# Patient Record
Sex: Female | Born: 1950 | Race: White | Hispanic: No | Marital: Married | Smoking: Former smoker
Health system: Southern US, Community
[De-identification: ages and names within clinical notes are randomized; demographics above are authoritative.]

## PROBLEM LIST (undated history)

## (undated) DIAGNOSIS — I10 Essential (primary) hypertension: Secondary | ICD-10-CM

## (undated) DIAGNOSIS — R32 Unspecified urinary incontinence: Secondary | ICD-10-CM

## (undated) DIAGNOSIS — E039 Hypothyroidism, unspecified: Secondary | ICD-10-CM

## (undated) HISTORY — PX: OTHER SURGICAL HISTORY: SHX169

## (undated) HISTORY — DX: Essential (primary) hypertension: I10

## (undated) HISTORY — DX: Unspecified urinary incontinence: R32

## (undated) HISTORY — DX: Hypothyroidism, unspecified: E03.9

---

## 1997-07-13 ENCOUNTER — Other Ambulatory Visit: Admission: RE | Admit: 1997-07-13 | Discharge: 1997-07-13 | Payer: Self-pay | Admitting: *Deleted

## 1997-11-09 ENCOUNTER — Other Ambulatory Visit: Admission: RE | Admit: 1997-11-09 | Discharge: 1997-11-09 | Payer: Self-pay | Admitting: *Deleted

## 1998-01-05 ENCOUNTER — Other Ambulatory Visit: Admission: RE | Admit: 1998-01-05 | Discharge: 1998-01-05 | Payer: Self-pay | Admitting: *Deleted

## 1998-06-07 ENCOUNTER — Other Ambulatory Visit: Admission: RE | Admit: 1998-06-07 | Discharge: 1998-06-07 | Payer: Self-pay | Admitting: *Deleted

## 1999-06-28 ENCOUNTER — Other Ambulatory Visit: Admission: RE | Admit: 1999-06-28 | Discharge: 1999-06-28 | Payer: Self-pay | Admitting: *Deleted

## 1999-09-16 ENCOUNTER — Other Ambulatory Visit: Admission: RE | Admit: 1999-09-16 | Discharge: 1999-09-16 | Payer: Self-pay | Admitting: *Deleted

## 2000-07-16 ENCOUNTER — Other Ambulatory Visit: Admission: RE | Admit: 2000-07-16 | Discharge: 2000-07-16 | Payer: Self-pay | Admitting: *Deleted

## 2000-11-19 ENCOUNTER — Other Ambulatory Visit: Admission: RE | Admit: 2000-11-19 | Discharge: 2000-11-19 | Payer: Self-pay | Admitting: *Deleted

## 2001-06-24 ENCOUNTER — Other Ambulatory Visit: Admission: RE | Admit: 2001-06-24 | Discharge: 2001-06-24 | Payer: Self-pay | Admitting: *Deleted

## 2002-07-01 ENCOUNTER — Other Ambulatory Visit: Admission: RE | Admit: 2002-07-01 | Discharge: 2002-07-01 | Payer: Self-pay | Admitting: *Deleted

## 2003-07-07 ENCOUNTER — Other Ambulatory Visit: Payer: Self-pay

## 2003-09-07 ENCOUNTER — Other Ambulatory Visit: Admission: RE | Admit: 2003-09-07 | Discharge: 2003-09-07 | Payer: Self-pay | Admitting: *Deleted

## 2004-09-26 ENCOUNTER — Other Ambulatory Visit: Admission: RE | Admit: 2004-09-26 | Discharge: 2004-09-26 | Payer: Self-pay | Admitting: *Deleted

## 2005-01-08 ENCOUNTER — Emergency Department (HOSPITAL_COMMUNITY): Admission: EM | Admit: 2005-01-08 | Discharge: 2005-01-08 | Payer: Self-pay | Admitting: Emergency Medicine

## 2005-01-12 ENCOUNTER — Emergency Department: Payer: Self-pay | Admitting: Internal Medicine

## 2005-09-15 ENCOUNTER — Ambulatory Visit: Payer: Self-pay | Admitting: Internal Medicine

## 2006-06-19 ENCOUNTER — Ambulatory Visit: Payer: Self-pay | Admitting: Gastroenterology

## 2006-12-18 ENCOUNTER — Ambulatory Visit: Payer: Self-pay | Admitting: Internal Medicine

## 2008-10-01 ENCOUNTER — Ambulatory Visit: Payer: Self-pay | Admitting: Internal Medicine

## 2009-03-16 ENCOUNTER — Observation Stay: Payer: Self-pay | Admitting: Internal Medicine

## 2010-12-12 ENCOUNTER — Ambulatory Visit: Payer: Self-pay | Admitting: Internal Medicine

## 2011-04-11 ENCOUNTER — Ambulatory Visit: Payer: Self-pay | Admitting: Urology

## 2011-04-24 ENCOUNTER — Ambulatory Visit: Payer: Self-pay | Admitting: Urology

## 2011-05-19 ENCOUNTER — Ambulatory Visit: Payer: Self-pay | Admitting: Urology

## 2011-05-31 ENCOUNTER — Ambulatory Visit: Payer: Self-pay | Admitting: Urology

## 2011-06-08 ENCOUNTER — Ambulatory Visit: Payer: Self-pay | Admitting: Urology

## 2011-06-15 ENCOUNTER — Ambulatory Visit: Payer: Self-pay | Admitting: Urology

## 2011-12-13 ENCOUNTER — Ambulatory Visit: Payer: Self-pay

## 2012-12-26 ENCOUNTER — Ambulatory Visit: Payer: Self-pay

## 2013-02-03 ENCOUNTER — Ambulatory Visit: Payer: Self-pay

## 2013-05-26 ENCOUNTER — Ambulatory Visit: Payer: Self-pay | Admitting: Obstetrics & Gynecology

## 2013-05-26 DIAGNOSIS — Z01810 Encounter for preprocedural cardiovascular examination: Secondary | ICD-10-CM

## 2013-05-26 LAB — CBC
HCT: 43.5 % (ref 35.0–47.0)
HGB: 15 g/dL (ref 12.0–16.0)
MCH: 29 pg (ref 26.0–34.0)
MCHC: 34.4 g/dL (ref 32.0–36.0)
MCV: 84 fL (ref 80–100)
Platelet: 193 10*3/uL (ref 150–440)
RBC: 5.17 10*6/uL (ref 3.80–5.20)
RDW: 14.2 % (ref 11.5–14.5)
WBC: 6.7 10*3/uL (ref 3.6–11.0)

## 2013-05-26 LAB — BASIC METABOLIC PANEL
Anion Gap: 3 — ABNORMAL LOW (ref 7–16)
BUN: 11 mg/dL (ref 7–18)
CALCIUM: 9.2 mg/dL (ref 8.5–10.1)
CO2: 31 mmol/L (ref 21–32)
Chloride: 104 mmol/L (ref 98–107)
Creatinine: 0.66 mg/dL (ref 0.60–1.30)
Glucose: 81 mg/dL (ref 65–99)
OSMOLALITY: 274 (ref 275–301)
Potassium: 3.5 mmol/L (ref 3.5–5.1)
Sodium: 138 mmol/L (ref 136–145)

## 2013-05-26 LAB — PROTIME-INR
INR: 1
Prothrombin Time: 12.8 secs (ref 11.5–14.7)

## 2013-05-26 LAB — APTT: Activated PTT: 30 secs (ref 23.6–35.9)

## 2013-05-27 ENCOUNTER — Ambulatory Visit: Payer: Self-pay | Admitting: Obstetrics & Gynecology

## 2013-05-28 LAB — HEMOGLOBIN: HGB: 14.3 g/dL (ref 12.0–16.0)

## 2013-05-28 LAB — PATHOLOGY REPORT

## 2014-03-16 ENCOUNTER — Ambulatory Visit: Payer: Self-pay

## 2014-09-12 NOTE — Op Note (Signed)
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   PROCEDURE: Total vaginal hysterectomy with anterior colporrhaphy.   SURGEON: Annamarie MajorPaul Roniya Tetro, M.D.   ASSISTANT: Jean RosenthalJackson.   ANESTHESIA: Spinal.   ESTIMATED BLOOD LOSS: 50 mL.   COMPLICATIONS: None.   FINDINGS: Small uterus with [name] significant uterine prolapse along with cystocele, ovaries were atrophic and high and not assessable.   DISPOSITION: To the recovery room in stable condition.   TECHNIQUE: The patient is prepped and draped in the usual sterile fashion after adequate anesthesia is obtained in the dorsal lithotomy position. [name] Foley catheter is inserted. [name] speculum is placed and the cervix is identified. Bimanual exam is performed. The uterus is small with no adnexal masses. The prolapse is significant for both cystocele and uterine prolapse.   [name] Jacobs tenaculum is placed on the cervix. The circumference of the cervix is infiltrated with 1% lidocaine with epinephrine followed by [name] circumferential incision using the Bovie electrocautery. Dissection into the posterior cul-de-sac is performed and [name] weighted speculum is applied. The uterosacral ligaments are clamped, transected, and suture ligated and sutured to the vaginal cuff. The uterine arteries are clamped, transected, and suture ligated. Anterior peritoneum is penetrated and retractors placed. The remaining portion of the broad ligament and cardinal ligaments are clamped, transected, and suture ligated. The cornua was clamped, transected, and suture ligated with complete amputation of the uterus and cervix.   Inspection for bleeding is performed with excellent hemostasis noted. Ovaries are now visually assessable or palpated and are left in place. The  peritoneum is closed with 1 Vicryl suture in [name] pursestring fashion, and the uterosacral ligaments are plicated with Ethibond suture.   Anterior colporrhaphy is performed with Allis clamps placed along the midline anterior vaginal wall and 1% lidocaine with epinephrine is infiltrated in this submucosal space. The endopelvic fascia is then dissected away from the vaginal mucosa after the midline incision is performed.  Using Vicryl sutures, plication sutures are applied to incorporate the endopelvic fascia across the midline. Excess vaginal mucosa is then excised. The incision of the anterior colporrhaphy and hysterectomy is then closed with [name] running 2-0 Vicryl suture in [name] locking fashion with excellent hemostasis noted. The vaginal cavity is irrigated and [name] packing sponge with Premarin cream applied, is placed vaginally for overnight purposes. Catheter is left in place. The patient goes to the recovery room in stable condition. All sponge, instrument and needle counts are correct.    ____________________________ R. Annamarie MajorPaul Obdulio Mash, MD rph:sg D: 05/27/2013 13:06:46 ET T: 05/27/2013 13:24:26 ET JOB#: [id number]  cc: Dierdre Searles. Paul Tsering Leaman, MD, <Dictator> Nadara MustardOBERT P Tarini Carrier MD ELECTRONICALLY SIGNED 05/28/2013 7:17

## 2015-01-06 ENCOUNTER — Other Ambulatory Visit: Payer: Self-pay | Admitting: Nurse Practitioner

## 2015-01-06 DIAGNOSIS — Z1231 Encounter for screening mammogram for malignant neoplasm of breast: Secondary | ICD-10-CM

## 2015-03-29 ENCOUNTER — Ambulatory Visit
Admission: RE | Admit: 2015-03-29 | Discharge: 2015-03-29 | Disposition: A | Discharge status: Home / Self Care | Payer: PPO | Source: Ambulatory Visit | Attending: Nurse Practitioner | Admitting: Nurse Practitioner

## 2015-03-29 DIAGNOSIS — Z1231 Encounter for screening mammogram for malignant neoplasm of breast: Secondary | ICD-10-CM | POA: Insufficient documentation

## 2015-07-13 DIAGNOSIS — N951 Menopausal and female climacteric states: Secondary | ICD-10-CM | POA: Diagnosis not present

## 2015-07-13 DIAGNOSIS — F411 Generalized anxiety disorder: Secondary | ICD-10-CM | POA: Diagnosis not present

## 2015-07-13 DIAGNOSIS — J309 Allergic rhinitis, unspecified: Secondary | ICD-10-CM | POA: Diagnosis not present

## 2015-07-13 DIAGNOSIS — E039 Hypothyroidism, unspecified: Secondary | ICD-10-CM | POA: Diagnosis not present

## 2015-07-13 DIAGNOSIS — E559 Vitamin D deficiency, unspecified: Secondary | ICD-10-CM | POA: Diagnosis not present

## 2015-07-13 DIAGNOSIS — Z0001 Encounter for general adult medical examination with abnormal findings: Secondary | ICD-10-CM | POA: Diagnosis not present

## 2016-01-10 DIAGNOSIS — E039 Hypothyroidism, unspecified: Secondary | ICD-10-CM | POA: Diagnosis not present

## 2016-01-10 DIAGNOSIS — E559 Vitamin D deficiency, unspecified: Secondary | ICD-10-CM | POA: Diagnosis not present

## 2016-01-10 DIAGNOSIS — G4700 Insomnia, unspecified: Secondary | ICD-10-CM | POA: Diagnosis not present

## 2016-01-10 DIAGNOSIS — I10 Essential (primary) hypertension: Secondary | ICD-10-CM | POA: Diagnosis not present

## 2016-01-10 DIAGNOSIS — N951 Menopausal and female climacteric states: Secondary | ICD-10-CM | POA: Diagnosis not present

## 2016-03-01 DIAGNOSIS — M5412 Radiculopathy, cervical region: Secondary | ICD-10-CM | POA: Diagnosis not present

## 2016-03-22 DIAGNOSIS — M5412 Radiculopathy, cervical region: Secondary | ICD-10-CM | POA: Diagnosis not present

## 2016-07-18 ENCOUNTER — Other Ambulatory Visit: Payer: Self-pay | Admitting: Internal Medicine

## 2016-07-18 DIAGNOSIS — N390 Urinary tract infection, site not specified: Secondary | ICD-10-CM | POA: Diagnosis not present

## 2016-07-18 DIAGNOSIS — N951 Menopausal and female climacteric states: Secondary | ICD-10-CM | POA: Diagnosis not present

## 2016-07-18 DIAGNOSIS — H6123 Impacted cerumen, bilateral: Secondary | ICD-10-CM | POA: Diagnosis not present

## 2016-07-18 DIAGNOSIS — Z0001 Encounter for general adult medical examination with abnormal findings: Secondary | ICD-10-CM | POA: Diagnosis not present

## 2016-07-18 DIAGNOSIS — R030 Elevated blood-pressure reading, without diagnosis of hypertension: Secondary | ICD-10-CM | POA: Diagnosis not present

## 2016-07-18 DIAGNOSIS — Z1231 Encounter for screening mammogram for malignant neoplasm of breast: Secondary | ICD-10-CM

## 2016-07-18 DIAGNOSIS — E039 Hypothyroidism, unspecified: Secondary | ICD-10-CM | POA: Diagnosis not present

## 2016-07-24 DIAGNOSIS — H6121 Impacted cerumen, right ear: Secondary | ICD-10-CM | POA: Diagnosis not present

## 2016-07-24 DIAGNOSIS — G4700 Insomnia, unspecified: Secondary | ICD-10-CM | POA: Diagnosis not present

## 2016-07-24 DIAGNOSIS — R030 Elevated blood-pressure reading, without diagnosis of hypertension: Secondary | ICD-10-CM | POA: Diagnosis not present

## 2016-08-15 ENCOUNTER — Ambulatory Visit
Admission: RE | Admit: 2016-08-15 | Discharge: 2016-08-15 | Disposition: A | Discharge status: Home / Self Care | Payer: PPO | Source: Ambulatory Visit | Attending: Internal Medicine | Admitting: Internal Medicine

## 2016-08-15 DIAGNOSIS — Z1231 Encounter for screening mammogram for malignant neoplasm of breast: Secondary | ICD-10-CM | POA: Diagnosis not present

## 2016-11-14 ENCOUNTER — Emergency Department
Admission: EM | Admit: 2016-11-14 | Discharge: 2016-11-14 | Disposition: A | Discharge status: Home / Self Care | Payer: PPO | Attending: Emergency Medicine | Admitting: Emergency Medicine

## 2016-11-14 ENCOUNTER — Emergency Department: Payer: PPO

## 2016-11-14 ENCOUNTER — Encounter: Payer: Self-pay | Admitting: *Deleted

## 2016-11-14 DIAGNOSIS — R42 Dizziness and giddiness: Secondary | ICD-10-CM | POA: Diagnosis not present

## 2016-11-14 DIAGNOSIS — R030 Elevated blood-pressure reading, without diagnosis of hypertension: Secondary | ICD-10-CM | POA: Diagnosis not present

## 2016-11-14 DIAGNOSIS — R110 Nausea: Secondary | ICD-10-CM | POA: Diagnosis not present

## 2016-11-14 DIAGNOSIS — I10 Essential (primary) hypertension: Secondary | ICD-10-CM | POA: Diagnosis not present

## 2016-11-14 DIAGNOSIS — Z79899 Other long term (current) drug therapy: Secondary | ICD-10-CM | POA: Insufficient documentation

## 2016-11-14 DIAGNOSIS — G4453 Primary thunderclap headache: Secondary | ICD-10-CM | POA: Insufficient documentation

## 2016-11-14 DIAGNOSIS — R6884 Jaw pain: Secondary | ICD-10-CM | POA: Diagnosis not present

## 2016-11-14 DIAGNOSIS — R51 Headache: Secondary | ICD-10-CM | POA: Diagnosis not present

## 2016-11-14 LAB — BASIC METABOLIC PANEL
Anion gap: 6 (ref 5–15)
BUN: 14 mg/dL (ref 6–20)
CO2: 27 mmol/L (ref 22–32)
Calcium: 9.4 mg/dL (ref 8.9–10.3)
Chloride: 105 mmol/L (ref 101–111)
Creatinine, Ser: 0.73 mg/dL (ref 0.44–1.00)
GFR calc Af Amer: >60 mL/min (ref >60)
GFR calc non Af Amer: >60 mL/min (ref >60)
Glucose, Bld: 126 mg/dL — ABNORMAL HIGH (ref 65–99)
Potassium: 3.9 mmol/L (ref 3.5–5.1)
Sodium: 138 mmol/L (ref 135–145)

## 2016-11-14 LAB — PROTIME-INR
INR: 0.99
Prothrombin Time: 13.1 seconds (ref 11.4–15.2)

## 2016-11-14 LAB — CBC
HCT: 45.9 % (ref 35.0–47.0)
Hemoglobin: 15.5 g/dL (ref 12.0–16.0)
MCH: 28.6 pg (ref 26.0–34.0)
MCHC: 33.7 g/dL (ref 32.0–36.0)
MCV: 84.8 fL (ref 80.0–100.0)
Platelets: 206 10*3/uL (ref 150–440)
RBC: 5.41 MIL/uL — ABNORMAL HIGH (ref 3.80–5.20)
RDW: 15.6 % — ABNORMAL HIGH (ref 11.5–14.5)
WBC: 7.9 10*3/uL (ref 3.6–11.0)

## 2016-11-14 LAB — HEPATIC FUNCTION PANEL
ALK PHOS: 113 U/L (ref 38–126)
ALT: 20 U/L (ref 14–54)
AST: 22 U/L (ref 15–41)
Albumin: 4.3 g/dL (ref 3.5–5.0)
BILIRUBIN TOTAL: 0.5 mg/dL (ref 0.3–1.2)
Bilirubin, Direct: <0.1 mg/dL — ABNORMAL LOW (ref 0.1–0.5)
Total Protein: 7.9 g/dL (ref 6.5–8.1)

## 2016-11-14 LAB — SEDIMENTATION RATE: Sed Rate: 4 mm/hr (ref 0–30)

## 2016-11-14 LAB — TROPONIN I: Troponin I: <0.03 ng/mL (ref <0.03)

## 2016-11-14 MED ORDER — IOPAMIDOL (ISOVUE-370) INJECTION 76%
75.0000 mL | Freq: Once | INTRAVENOUS | Status: AC | PRN
  Administered 2016-11-14: 75 mL via INTRAVENOUS

## 2016-11-14 MED ORDER — PROCHLORPERAZINE EDISYLATE 5 MG/ML IJ SOLN
10.0000 mg | Freq: Once | INTRAMUSCULAR | Status: AC
  Administered 2016-11-14: 10 mg via INTRAVENOUS
  Filled 2016-11-14: qty 2

## 2016-11-14 MED ORDER — DIPHENHYDRAMINE HCL 50 MG/ML IJ SOLN
25.0000 mg | Freq: Once | INTRAMUSCULAR | Status: AC
  Administered 2016-11-14: 25 mg via INTRAVENOUS
  Filled 2016-11-14: qty 1

## 2016-11-14 NOTE — ED Notes (Signed)
Pt states pain is subsided at this time.

## 2016-11-14 NOTE — ED Notes (Signed)
Patient transported to CT 

## 2016-11-14 NOTE — Discharge Instructions (Signed)
Fortunately today her blood work and your CT scans were reassuring. Your blood pressure however remains elevated which is concerning. Please make an appointment to follow-up with your primary care physician within this week for reevaluation. Return to the emergency department for any concerns.  It was a pleasure to take care of you today, and thank you for coming to our emergency department.  If you have any questions or concerns before leaving please ask the nurse to grab me and I'm more than happy to go through your aftercare instructions again.  If you were prescribed any opioid pain medication today such as Norco, Vicodin, Percocet, morphine, hydrocodone, or oxycodone please make sure you do not drive when you are taking this medication as it can alter your ability to drive safely.  If you have any concerns once you are home that you are not improving or are in fact getting worse before you can make it to your follow-up appointment, please do not hesitate to call [phone] and come back for further evaluation.  Merrily BrittleNeil Iseah Plouff MD  Results for orders placed or performed during the hospital encounter of 11/14/16  Basic metabolic panel  Result Value Ref Range   Sodium 138 135 - 145 mmol/L   Potassium 3.9 3.5 - 5.1 mmol/L   Chloride 105 101 - 111 mmol/L   CO2 27 22 - 32 mmol/L   Glucose, Bld 126 (H) 65 - 99 mg/dL   BUN 14 6 - 20 mg/dL   Creatinine, Ser 1.610.73 0.44 - 1.00 mg/dL   Calcium 9.4 8.9 - 09.610.3 mg/dL   GFR calc non Af Amer >60 >60 mL/min   GFR calc Af Amer >60 >60 mL/min   Anion gap 6 5 - 15  CBC  Result Value Ref Range   WBC 7.9 3.6 - 11.0 K/uL   RBC 5.41 (H) 3.80 - 5.20 MIL/uL   Hemoglobin 15.5 12.0 - 16.0 g/dL   HCT 04.545.9 40.935.0 - 81.147.0 %   MCV 84.8 80.0 - 100.0 fL   MCH 28.6 26.0 - 34.0 pg   MCHC 33.7 32.0 - 36.0 g/dL   RDW 91.415.6 (H) 78.211.5 - 95.614.5 %   Platelets 206 150 - 440 K/uL  Troponin I  Result Value Ref Range   Troponin I <0.03 <0.03 ng/mL  Sedimentation rate  Result Value Ref  Range   Sed Rate 4 0 - 30 mm/hr  Hepatic function panel  Result Value Ref Range   Total Protein 7.9 6.5 - 8.1 g/dL   Albumin 4.3 3.5 - 5.0 g/dL   AST 22 15 - 41 U/L   ALT 20 14 - 54 U/L   Alkaline Phosphatase 113 38 - 126 U/L   Total Bilirubin 0.5 0.3 - 1.2 mg/dL   Bilirubin, Direct <2.1<0.1 (L) 0.1 - 0.5 mg/dL   Indirect Bilirubin NOT CALCULATED 0.3 - 0.9 mg/dL  Protime-INR  Result Value Ref Range   Prothrombin Time 13.1 11.4 - 15.2 seconds   INR 0.99    Ct Angio Head W/cm &/or Wo Cm  Result Date: 11/14/2016 CLINICAL DATA:  Initial evaluation for acute thunderclap headache. EXAM: CT ANGIOGRAPHY HEAD AND NECK TECHNIQUE: Multidetector CT imaging of the head and neck was performed using the standard protocol during bolus administration of intravenous contrast. Multiplanar CT image reconstructions and MIPs were obtained to evaluate the vascular anatomy. Carotid stenosis measurements (when applicable) are obtained utilizing NASCET criteria, using the distal internal carotid diameter as the denominator. CONTRAST:  75 cc of Isovue 370.  COMPARISON:  None. FINDINGS: CT HEAD FINDINGS Brain: Age-related cerebral volume loss with mild chronic microvascular disease. No acute intracranial hemorrhage. No evidence for acute large vessel territory infarct. No mass lesion, midline shift or mass effect. No hydrocephalus. No extra-axial fluid collection. Calcification overlying the left frontal convexity favored to reflect an exostosis. Vascular: No hyperdense vessel. Scattered vascular calcifications noted within the carotid siphons. Skull: Scalp soft tissues and calvarium within normal limits. Sinuses: Visualized paranasal sinuses and mastoid air cells are clear. Orbits: Visualized globes and orbital soft tissues within normal limits. Review of the MIP images confirms the above findings CTA NECK FINDINGS Aortic arch: Visualized aortic arch of normal caliber with normal branch pattern. No flow-limiting stenosis about  the origin of the great vessels. Visualized subclavian artery is widely patent. Right carotid system: Right common and internal carotid artery's widely patent without stenosis, dissection, or occlusion. No significant atheromatous narrowing about the right carotid bifurcation. Left carotid system: Left common and internal carotid artery's widely patent without stenosis, dissection, or occlusion. No significant atheromatous narrowing about the left carotid bifurcation. Minimal linear opacity extending to the proximal left ICA favored to be artifactual nature due to mixing and/ or beam hardening artifact. Vertebral arteries: Both of the of the vertebral arteries arise from the subclavian arteries. Left vertebral artery slightly dominant. Evaluation the vertebral artery is mildly limited in scattered areas related to venous contamination. Vertebral arteries patent within the neck without stenosis, dissection, or occlusion. Skeleton: No acute osseus abnormality. No worrisome lytic or blastic osseous lesions. Moderate degenerative spondylolysis present at C4-5 through C6-7. Other neck: Soft tissues of the neck demonstrate no acute abnormality. Salivary glands normal. Thyroid appears to be absent. No adenopathy. Upper chest: Visualized upper chest within normal limits. Visualized lungs are clear. 2 mm nodule present at the medial aspect of the left upper lobe (series 8, image 30), indeterminate. Review of the MIP images confirms the above findings CTA HEAD FINDINGS Anterior circulation: The petrous, cavernous, and supraclinoid segments patent bilaterally without flow-limiting stenosis. Mild atheromatous irregularity noted within the carotid siphons bilaterally. ICA termini widely patent. A1 segments patent bilaterally. Anterior communicating artery normal. Anterior cerebral arteries patent to their distal aspects. M1 segments patent without stenosis or occlusion. MCA bifurcations normal. No proximal M2 occlusion. Distal  MCA branches well opacified and symmetric. Posterior circulation: Vertebral arteries patent to the vertebrobasilar junction without stenosis. Left vertebral artery dominant. Posterior inferior cerebral arteries patent bilaterally. Basilar artery widely patent to its distal aspect. Superior cerebral arteries patent bilaterally. PCAs well opacified to their distal aspects. PCAs both supplied via the basilar as well as small bilateral posterior communicating arteries. Venous sinuses: Mild narrowing and irregularity involving the distal transverse/ sigmoid sinuses bilaterally favored to reflect mixing artifact and/or arachnoid granulations. No definite venous sinus thrombosis identified. Anatomic variants: No significant anatomic variant. No aneurysm or vascular malformation. Delayed phase: No pathologic enhancement. Review of the MIP images confirms the above findings IMPRESSION: 1. Negative CTA of the head and neck. No acute vascular abnormality identified. No aneurysm. Relatively minimal atheromatous disease for patient age. No high-grade or correctable stenosis identified. 2. No acute intracranial process identified. 3. Age-related cerebral atrophy with mild chronic small vessel ischemic disease. Electronically Signed   By: Rise Mu M.D.   On: 11/14/2016 20:29   Dg Chest 2 View  Result Date: 11/14/2016 CLINICAL DATA:  Jaw pain, nausea and dizziness EXAM: CHEST  2 VIEW COMPARISON:  05/26/2013 FINDINGS: Hyperinflation. No focal consolidation or pleural effusion.  Stable cardiomediastinal silhouette. No pneumothorax. IMPRESSION: No active cardiopulmonary disease. Electronically Signed   By: Jasmine Pang M.D.   On: 11/14/2016 18:43   Ct Angio Neck W And/or Wo Contrast  Result Date: 11/14/2016 CLINICAL DATA:  Initial evaluation for acute thunderclap headache. EXAM: CT ANGIOGRAPHY HEAD AND NECK TECHNIQUE: Multidetector CT imaging of the head and neck was performed using the standard protocol during  bolus administration of intravenous contrast. Multiplanar CT image reconstructions and MIPs were obtained to evaluate the vascular anatomy. Carotid stenosis measurements (when applicable) are obtained utilizing NASCET criteria, using the distal internal carotid diameter as the denominator. CONTRAST:  75 cc of Isovue 370. COMPARISON:  None. FINDINGS: CT HEAD FINDINGS Brain: Age-related cerebral volume loss with mild chronic microvascular disease. No acute intracranial hemorrhage. No evidence for acute large vessel territory infarct. No mass lesion, midline shift or mass effect. No hydrocephalus. No extra-axial fluid collection. Calcification overlying the left frontal convexity favored to reflect an exostosis. Vascular: No hyperdense vessel. Scattered vascular calcifications noted within the carotid siphons. Skull: Scalp soft tissues and calvarium within normal limits. Sinuses: Visualized paranasal sinuses and mastoid air cells are clear. Orbits: Visualized globes and orbital soft tissues within normal limits. Review of the MIP images confirms the above findings CTA NECK FINDINGS Aortic arch: Visualized aortic arch of normal caliber with normal branch pattern. No flow-limiting stenosis about the origin of the great vessels. Visualized subclavian artery is widely patent. Right carotid system: Right common and internal carotid artery's widely patent without stenosis, dissection, or occlusion. No significant atheromatous narrowing about the right carotid bifurcation. Left carotid system: Left common and internal carotid artery's widely patent without stenosis, dissection, or occlusion. No significant atheromatous narrowing about the left carotid bifurcation. Minimal linear opacity extending to the proximal left ICA favored to be artifactual nature due to mixing and/ or beam hardening artifact. Vertebral arteries: Both of the of the vertebral arteries arise from the subclavian arteries. Left vertebral artery slightly  dominant. Evaluation the vertebral artery is mildly limited in scattered areas related to venous contamination. Vertebral arteries patent within the neck without stenosis, dissection, or occlusion. Skeleton: No acute osseus abnormality. No worrisome lytic or blastic osseous lesions. Moderate degenerative spondylolysis present at C4-5 through C6-7. Other neck: Soft tissues of the neck demonstrate no acute abnormality. Salivary glands normal. Thyroid appears to be absent. No adenopathy. Upper chest: Visualized upper chest within normal limits. Visualized lungs are clear. 2 mm nodule present at the medial aspect of the left upper lobe (series 8, image 30), indeterminate. Review of the MIP images confirms the above findings CTA HEAD FINDINGS Anterior circulation: The petrous, cavernous, and supraclinoid segments patent bilaterally without flow-limiting stenosis. Mild atheromatous irregularity noted within the carotid siphons bilaterally. ICA termini widely patent. A1 segments patent bilaterally. Anterior communicating artery normal. Anterior cerebral arteries patent to their distal aspects. M1 segments patent without stenosis or occlusion. MCA bifurcations normal. No proximal M2 occlusion. Distal MCA branches well opacified and symmetric. Posterior circulation: Vertebral arteries patent to the vertebrobasilar junction without stenosis. Left vertebral artery dominant. Posterior inferior cerebral arteries patent bilaterally. Basilar artery widely patent to its distal aspect. Superior cerebral arteries patent bilaterally. PCAs well opacified to their distal aspects. PCAs both supplied via the basilar as well as small bilateral posterior communicating arteries. Venous sinuses: Mild narrowing and irregularity involving the distal transverse/ sigmoid sinuses bilaterally favored to reflect mixing artifact and/or arachnoid granulations. No definite venous sinus thrombosis identified. Anatomic variants: No significant anatomic  variant.  No aneurysm or vascular malformation. Delayed phase: No pathologic enhancement. Review of the MIP images confirms the above findings IMPRESSION: 1. Negative CTA of the head and neck. No acute vascular abnormality identified. No aneurysm. Relatively minimal atheromatous disease for patient age. No high-grade or correctable stenosis identified. 2. No acute intracranial process identified. 3. Age-related cerebral atrophy with mild chronic small vessel ischemic disease. Electronically Signed   By: Rise Mu M.D.   On: 11/14/2016 20:29

## 2016-11-14 NOTE — ED Provider Notes (Signed)
[facility] Emergency Department Provider Note  ____________________________________________   First MD Initiated Contact with Patient 11/14/16 1813     (approximate)  I have reviewed the triage vital signs and the nursing notes.   HISTORY  Chief Complaint Jaw Pain and Dizziness   HPI Michelle [name] is a 66 y.o. female who comes to the emergency department with sudden onset and maximal onset posterior neck pain radiating down to her jaw and up across her head that began at 3 PM today unlike any headache she's ever had before. The headache was intense for several hours but is slowly abated and is minimal at this time. She reported nausea with the headache but no vomiting. No double vision or blurred vision. She did not try to eat and is not sure if she had jaw claudication. She denies chest pain or shortness of breath. She has no history of hypertension. What concerned her today was her husband checked her blood pressure at home and noted it was elevated so she brought her to the emergency department. She's had no fevers or chills. Nothing in particular seemed to make the pain worse and it improved with time.   History reviewed. No pertinent past medical history.  There are no active problems to display for this patient.   No past surgical history on file.  Prior to Admission medications   Medication Sig Start Date End Date Taking? Authorizing Provider  ALPRAZolam (XANAX) 0.25 MG tablet Take 0.25 mg by mouth as needed. 10/20/16  Yes [provider]  estradiol (ESTRACE) 0.5 MG tablet Take 0.5 mg by mouth daily. 10/06/16  Yes [provider]  fluticasone (FLONASE) 50 MCG/ACT nasal spray Place 1 spray into both nostrils daily as needed. 09/21/16  Yes [provider]  levothyroxine (SYNTHROID, LEVOTHROID) 100 MCG tablet Take 100 mcg by mouth daily. 11/08/16  Yes [provider]  meloxicam (MOBIC) 15 MG tablet Take 15 mg by mouth  daily as needed. 11/09/16  Yes [provider]    Allergies Sulfa antibiotics  No family history on file.  Social History Social History  Substance Use Topics  . Smoking status: Never Smoker  . Smokeless tobacco: Never Used  . Alcohol use No    Review of Systems Constitutional: No fever/chills Eyes: No visual changes. ENT: No sore throat. Cardiovascular: Denies chest pain. Respiratory: Denies shortness of breath. Gastrointestinal: No abdominal pain.  No nausea, no vomiting.  No diarrhea.  No constipation. Genitourinary: Negative for dysuria. Musculoskeletal: Negative for back pain. Skin: Negative for rash. Neurological: Positive for headache   ____________________________________________   PHYSICAL EXAM:  VITAL SIGNS: ED Triage Vitals  Enc Vitals Group     BP 11/14/16 1807 (!) 219/100     Pulse Rate 11/14/16 1807 72     Resp 11/14/16 1807 20     Temp 11/14/16 1807 98.1 F (36.7 C)     Temp Source 11/14/16 1807 Oral     SpO2 11/14/16 1807 99 %     Weight 11/14/16 1803 144 lb (65.3 kg)     Height 11/14/16 1803 '5\' 2"'  (1.575 m)     Head Circumference --      Peak Flow --      Pain Score 11/14/16 1802 5     Pain Loc --      Pain Edu? --      Excl. in [place]? --     Constitutional: Alert and oriented 4 well appearing nontoxic no diaphoresis  speaks in full clear sentences Eyes: PERRL EOMI. pupils midrange bilaterally and brisk Head: Atraumatic. Nose: No congestion/rhinnorhea. Mouth/Throat: No trismus no tenderness to either temple Neck: No stridor.   Cardiovascular: Normal rate, regular rhythm. Grossly normal heart sounds.  Good peripheral circulation. Respiratory: Normal respiratory effort.  No retractions. Lungs CTAB and moving good air Gastrointestinal: Soft nontender Musculoskeletal: No lower extremity edema   Neurologic:  Normal speech and language. No gross focal neurologic deficits are appreciated. Skin:  Skin is warm, dry and intact. No rash  noted. Psychiatric: Mood and affect are normal. Speech and behavior are normal.    ____________________________________________   DIFFERENTIAL includes but not limited to  Subarachnoid hemorrhage, thunderclap headache, intracerebral hemorrhage, aneurysm, migraine headache, acute coronary syndrome, glaucoma, temporal arteritis ____________________________________________   LABS (all labs ordered are listed, but only abnormal results are displayed)  Labs Reviewed  BASIC METABOLIC PANEL - Abnormal; Notable for the following:       Result Value   Glucose, Bld 126 (*)    All other components within normal limits  CBC - Abnormal; Notable for the following:    RBC 5.41 (*)    RDW 15.6 (*)    All other components within normal limits  HEPATIC FUNCTION PANEL - Abnormal; Notable for the following:    Bilirubin, Direct <0.1 (*)    All other components within normal limits  TROPONIN I  SEDIMENTATION RATE  PROTIME-INR    No signs of acute ischemia labs unremarkable __________________________________________  EKG  ED ECG REPORT I, Darel Hong, the attending physician, personally viewed and interpreted this ECG.  Date: 11/14/2016 Rate: 73 Rhythm: normal sinus rhythm QRS Axis: normal Intervals: normal ST/T Wave abnormalities: normal Narrative Interpretation: unremarkable  ____________________________________________  RADIOLOGY  CT scans with no acute disease ____________________________________________   PROCEDURES  Procedure(s) performed: no  Procedures  Critical Care performed: no  Observation: no ____________________________________________   INITIAL IMPRESSION / ASSESSMENT AND PLAN / ED COURSE  Pertinent labs & imaging results that were available during my care of the patient were reviewed by me and considered in my medical decision making (see chart for details).  While the patient is well-appearing now, she does have a significantly elevated blood  pressure and reports a thunderclap headache. Her headache was at 3 PM today. I'm sending her emergently to CT and CT angiogram to evaluate for subarachnoid hemorrhage, aneurysm, vertebral artery dissection etc.      Fortunately the patient's CT scans are negative for acute pathology. She has no signs of bleeding and she is within 6 hours of the onset of the headache so LP is not required.  ESR is negative so I doubt temporal arteritis. No visual changes so doubt glaucoma. Her pain is nearly completely resolved after Compazine and Benadryl. At this point she is medically stable for outpatient management understands and agrees the plan. ____________________________________________   FINAL CLINICAL IMPRESSION(S) / ED DIAGNOSES  Final diagnoses:  Thunderclap headache  Elevated blood pressure reading      NEW MEDICATIONS STARTED DURING THIS VISIT:  Discharge Medication List as of 11/14/2016  8:50 PM       Note:  This document was prepared using Dragon voice recognition software and may include unintentional dictation errors.     Darel Hong, MD 11/15/16 1815

## 2016-11-14 NOTE — ED Notes (Signed)
Pt returned from xray

## 2016-11-14 NOTE — ED Triage Notes (Signed)
Pt to triage via wheelchair.  Pt reports jaw pain, nausea, dizziness and bil shoulder pain.  Pt denies chest pain.  Pt also reports sob.  Nonsmoker.  No cough.  No cough.  Blood pressure elevated.  Pt alert.  Speech clear.

## 2016-11-16 DIAGNOSIS — F411 Generalized anxiety disorder: Secondary | ICD-10-CM | POA: Diagnosis not present

## 2016-11-16 DIAGNOSIS — I10 Essential (primary) hypertension: Secondary | ICD-10-CM | POA: Diagnosis not present

## 2016-11-16 DIAGNOSIS — E039 Hypothyroidism, unspecified: Secondary | ICD-10-CM | POA: Diagnosis not present

## 2016-11-16 DIAGNOSIS — N393 Stress incontinence (female) (male): Secondary | ICD-10-CM | POA: Diagnosis not present

## 2016-11-28 DIAGNOSIS — R300 Dysuria: Secondary | ICD-10-CM | POA: Diagnosis not present

## 2016-12-01 DIAGNOSIS — N390 Urinary tract infection, site not specified: Secondary | ICD-10-CM | POA: Diagnosis not present

## 2016-12-01 DIAGNOSIS — I10 Essential (primary) hypertension: Secondary | ICD-10-CM | POA: Diagnosis not present

## 2016-12-01 DIAGNOSIS — R319 Hematuria, unspecified: Secondary | ICD-10-CM | POA: Diagnosis not present

## 2017-02-01 DIAGNOSIS — I10 Essential (primary) hypertension: Secondary | ICD-10-CM | POA: Diagnosis not present

## 2017-02-01 DIAGNOSIS — G4700 Insomnia, unspecified: Secondary | ICD-10-CM | POA: Diagnosis not present

## 2017-02-01 DIAGNOSIS — N390 Urinary tract infection, site not specified: Secondary | ICD-10-CM | POA: Diagnosis not present

## 2017-02-19 DIAGNOSIS — R319 Hematuria, unspecified: Secondary | ICD-10-CM | POA: Diagnosis not present

## 2017-03-07 DIAGNOSIS — R319 Hematuria, unspecified: Secondary | ICD-10-CM | POA: Diagnosis not present

## 2017-03-07 DIAGNOSIS — I10 Essential (primary) hypertension: Secondary | ICD-10-CM | POA: Diagnosis not present

## 2017-03-07 DIAGNOSIS — F411 Generalized anxiety disorder: Secondary | ICD-10-CM | POA: Diagnosis not present

## 2017-06-01 ENCOUNTER — Other Ambulatory Visit: Payer: Self-pay | Admitting: Internal Medicine

## 2017-06-01 NOTE — Telephone Encounter (Signed)
Can you please send her xanax 

## 2017-08-01 ENCOUNTER — Other Ambulatory Visit: Payer: Self-pay | Admitting: Internal Medicine

## 2017-08-20 ENCOUNTER — Encounter: Payer: Self-pay | Admitting: Nurse Practitioner

## 2017-08-20 ENCOUNTER — Ambulatory Visit (INDEPENDENT_AMBULATORY_CARE_PROVIDER_SITE_OTHER): Payer: PPO | Admitting: Nurse Practitioner

## 2017-08-20 VITALS — BP 136/78 | Resp 16 | Ht 62.0 in | Wt 143.4 lb

## 2017-08-20 DIAGNOSIS — Z1231 Encounter for screening mammogram for malignant neoplasm of breast: Secondary | ICD-10-CM | POA: Diagnosis not present

## 2017-08-20 DIAGNOSIS — I10 Essential (primary) hypertension: Secondary | ICD-10-CM | POA: Diagnosis not present

## 2017-08-20 DIAGNOSIS — Z79899 Other long term (current) drug therapy: Secondary | ICD-10-CM | POA: Insufficient documentation

## 2017-08-20 DIAGNOSIS — Z1239 Encounter for other screening for malignant neoplasm of breast: Secondary | ICD-10-CM

## 2017-08-20 DIAGNOSIS — E039 Hypothyroidism, unspecified: Secondary | ICD-10-CM

## 2017-08-20 NOTE — Progress Notes (Signed)
[facility] [address] [place], [place] [id number]  Internal MEDICINE  Office Visit Note  Patient Name: Michelle [name]  [id number]  [id number]  Date of Service: 08/20/2017  Chief Complaint  Patient presents with  . Follow-up  . Hypertension    Hypertension  This is a chronic problem. The current episode started more than 1 year ago. The problem is unchanged. The problem is controlled. Pertinent negatives include no chest pain, headaches, neck pain, palpitations or shortness of breath. Agents associated with hypertension include estrogens and thyroid hormones. Risk factors for coronary artery disease include post-menopausal state. Past treatments include calcium channel blockers. The current treatment provides moderate improvement. There are no compliance problems.     Pt is here for routine follow up.    Current Medication: Outpatient Encounter Medications as of 08/20/2017  Medication Sig  . ALPRAZolam (XANAX) 0.25 MG tablet TAKE 1 TABLET BY MOUTH AT BEDTIME AS NEEDED  . amLODipine (NORVASC) 2.5 MG tablet TAKE 1 TABLET BY MOUTH IN THE MORNING FOR BLOOD PRESSURE  . estradiol (ESTRACE) 0.5 MG tablet TAKE 1 TABLET BY MOUTH ONCE DAILY  . levothyroxine (SYNTHROID, LEVOTHROID) 100 MCG tablet TAKE 1 TABLET BY MOUTH ONCE DAILY ON AN EMPTY STOMACH  . meloxicam (MOBIC) 15 MG tablet Take 15 mg by mouth daily as needed.  . fluticasone (FLONASE) 50 MCG/ACT nasal spray Place 1 spray into both nostrils daily as needed.   No facility-administered encounter medications on file as of 08/20/2017.     Surgical History: Past Surgical History:  Procedure Laterality Date  . child birth natural    . diatation      Medical History: Past Medical History:  Diagnosis Date  . Bladder incontinence   . Hypertension   . Hypothyroidism     Family History: Family History  Problem Relation Age of Onset  . Cancer Father   . Hyperlipidemia Father   . Hypertension Father     Social  History   Socioeconomic History  . Marital status: Married    Spouse name: Not on file  . Number of children: Not on file  . Years of education: Not on file  . Highest education level: Not on file  Occupational History  . Not on file  Social Needs  . Financial resource strain: Not on file  . Food insecurity:    Worry: Not on file    Inability: Not on file  . Transportation needs:    Medical: Not on file    Non-medical: Not on file  Tobacco Use  . Smoking status: Never Smoker  . Smokeless tobacco: Never Used  Substance and Sexual Activity  . Alcohol use: No  . Drug use: No  . Sexual activity: Not on file  Lifestyle  . Physical activity:    Days per week: Not on file    Minutes per session: Not on file  . Stress: Not on file  Relationships  . Social connections:    Talks on phone: Not on file    Gets together: Not on file    Attends religious service: Not on file    Active member of club or organization: Not on file    Attends meetings of clubs or organizations: Not on file    Relationship status: Not on file  . Intimate partner violence:    Fear of current or ex partner: Not on file    Emotionally abused: Not on file    Physically abused: Not on file  Forced sexual activity: Not on file  Other Topics Concern  . Not on file  Social History Narrative  . Not on file      Review of Systems  Constitutional: Negative for activity change, chills, fatigue and unexpected weight change.  HENT: Positive for postnasal drip. Negative for congestion, rhinorrhea, sneezing and sore throat.   Eyes: Negative.  Negative for redness.  Respiratory: Negative for cough, chest tightness and shortness of breath.   Cardiovascular: Negative for chest pain and palpitations.  Gastrointestinal: Negative for abdominal pain, constipation, diarrhea, nausea and vomiting.  Endocrine: Negative for cold intolerance, heat intolerance, polydipsia, polyphagia and polyuria.  Genitourinary:  Negative.  Negative for dysuria and frequency.  Musculoskeletal: Negative for arthralgias, back pain, joint swelling and neck pain.  Skin: Negative for rash.  Allergic/Immunologic: Negative for environmental allergies.  Neurological: Negative for tremors, numbness and headaches.  Hematological: Negative for adenopathy. Does not bruise/bleed easily.  Psychiatric/Behavioral: Negative for behavioral problems (Depression), sleep disturbance and suicidal ideas. The patient is nervous/anxious.     Today's Vitals   08/20/17 1149  BP: 136/78  Resp: 16  SpO2: 98%  Weight: 143 lb 6.4 oz (65 kg)  Height: 5\' 2"  (1.575 m)    Physical Exam  Constitutional: She is oriented to person, place, and time. She appears well-developed and well-nourished. No distress.  HENT:  Head: Normocephalic and atraumatic.  Mouth/Throat: Oropharynx is clear and moist. No oropharyngeal exudate.  Eyes: Pupils are equal, round, and reactive to light. EOM are normal.  Neck: Normal range of motion. Neck supple. No JVD present. Carotid bruit is not present. No tracheal deviation present. No thyromegaly present.  Cardiovascular: Normal rate, regular rhythm and normal heart sounds. Exam reveals no gallop and no friction rub.  No murmur heard. Pulmonary/Chest: Effort normal and breath sounds normal. No respiratory distress. She has no wheezes. She has no rales. She exhibits no tenderness.  Abdominal: Soft. Bowel sounds are normal.  Musculoskeletal: Normal range of motion.  Lymphadenopathy:    She has no cervical adenopathy.  Neurological: She is alert and oriented to person, place, and time. No cranial nerve deficit.  Skin: Skin is warm and dry. She is not diaphoretic.  Psychiatric: She has a normal mood and affect. Her behavior is normal. Judgment and thought content normal.  Nursing note and vitals reviewed.   Assessment/Plan:   1. Essential hypertension Stable. Continue bp medication as prescribed   2. Acquired  hypothyroidism Check thryoid panel prior to next visit. Adjust levothyrloxine as indicated.   3. Screening for breast cancer - MM DIGITAL SCREENING BILATERAL; Future   General Counseling: Michelle [name] understanding of the findings of todays visit and agrees with plan of treatment. I have discussed any further diagnostic evaluation that may be needed or ordered today. We also reviewed her medications today. she has been encouraged to call the office with any questions or concerns that should arise related to todays visit.  This patient was seen by Vincent Gros, FNP- C in Collaboration with Dr [name] as a part of collaborative care agreement    Orders Placed This Encounter  Procedures  . MM DIGITAL SCREENING BILATERAL      Time spent: 71  Minutes      Dr [name] Internal medicine

## 2017-12-21 ENCOUNTER — Ambulatory Visit: Payer: Self-pay | Admitting: Adult Health

## 2017-12-26 ENCOUNTER — Ambulatory Visit: Payer: Self-pay | Admitting: Adult Health

## 2017-12-28 ENCOUNTER — Encounter: Payer: Self-pay | Admitting: Nurse Practitioner

## 2017-12-28 ENCOUNTER — Ambulatory Visit (INDEPENDENT_AMBULATORY_CARE_PROVIDER_SITE_OTHER): Payer: PPO | Admitting: Nurse Practitioner

## 2017-12-28 VITALS — BP 124/80 | HR 66 | Resp 16 | Ht 62.0 in | Wt 139.0 lb

## 2017-12-28 DIAGNOSIS — E039 Hypothyroidism, unspecified: Secondary | ICD-10-CM

## 2017-12-28 DIAGNOSIS — I10 Essential (primary) hypertension: Secondary | ICD-10-CM | POA: Diagnosis not present

## 2017-12-28 DIAGNOSIS — N959 Unspecified menopausal and perimenopausal disorder: Secondary | ICD-10-CM | POA: Diagnosis not present

## 2017-12-28 DIAGNOSIS — F411 Generalized anxiety disorder: Secondary | ICD-10-CM | POA: Diagnosis not present

## 2017-12-28 MED ORDER — ESTRADIOL 0.5 MG PO TABS
0.5000 mg | ORAL_TABLET | Freq: Every day | ORAL | 4 refills | Status: DC
Start: 2017-12-28 — End: 2019-01-28

## 2017-12-28 MED ORDER — ALPRAZOLAM 0.25 MG PO TABS: 0.2500 mg | ORAL_TABLET | Freq: Every evening | ORAL | 5 refills | Status: DC | PRN

## 2017-12-28 MED ORDER — AMLODIPINE BESYLATE 2.5 MG PO TABS
2.5000 mg | ORAL_TABLET | Freq: Every day | ORAL | 4 refills | Status: DC
Start: 2017-12-28 — End: 2018-11-25

## 2017-12-28 MED ORDER — LEVOTHYROXINE SODIUM 100 MCG PO TABS: 100.0000 ug | ORAL_TABLET | Freq: Every day | ORAL | 4 refills | Status: DC

## 2017-12-28 NOTE — Progress Notes (Signed)
[facility] [address] [place], [place] [address]  Internal MEDICINE  Office Visit Note  Patient Name: Michelle [name]  [id number]  [id number]  Date of Service: 12/28/2017  Chief Complaint  Patient presents with  . Hypertension    follow up    Hypertension  This is a chronic problem. The current episode started more than 1 year ago. The problem is unchanged. The problem is controlled. Pertinent negatives include no chest pain, headaches, neck pain, palpitations or shortness of breath. Agents associated with hypertension include estrogens and thyroid hormones. Risk factors for coronary artery disease include post-menopausal state. Past treatments include calcium channel blockers. The current treatment provides moderate improvement. There are no compliance problems.        Current Medication: Outpatient Encounter Medications as of 12/28/2017  Medication Sig  . ALPRAZolam (XANAX) 0.25 MG tablet Take 1 tablet (0.25 mg total) by mouth at bedtime as needed.  Marland Kitchen amLODipine (NORVASC) 2.5 MG tablet Take 1 tablet (2.5 mg total) by mouth daily.  Marland Kitchen estradiol (ESTRACE) 0.5 MG tablet Take 1 tablet (0.5 mg total) by mouth daily.  . fluticasone (FLONASE) 50 MCG/ACT nasal spray Place 1 spray into both nostrils daily as needed.  Marland Kitchen levothyroxine (SYNTHROID, LEVOTHROID) 100 MCG tablet Take 1 tablet (100 mcg total) by mouth daily before breakfast.  . [DISCONTINUED] ALPRAZolam (XANAX) 0.25 MG tablet TAKE 1 TABLET BY MOUTH AT BEDTIME AS NEEDED  . [DISCONTINUED] amLODipine (NORVASC) 2.5 MG tablet TAKE 1 TABLET BY MOUTH IN THE MORNING FOR BLOOD PRESSURE  . [DISCONTINUED] estradiol (ESTRACE) 0.5 MG tablet TAKE 1 TABLET BY MOUTH ONCE DAILY  . [DISCONTINUED] levothyroxine (SYNTHROID, LEVOTHROID) 100 MCG tablet TAKE 1 TABLET BY MOUTH ONCE DAILY ON AN EMPTY STOMACH  . [DISCONTINUED] meloxicam (MOBIC) 15 MG tablet Take 15 mg by mouth daily as needed.   No facility-administered encounter medications  on file as of 12/28/2017.     Surgical History: Past Surgical History:  Procedure Laterality Date  . child birth natural    . diatation      Medical History: Past Medical History:  Diagnosis Date  . Bladder incontinence   . Hypertension   . Hypothyroidism     Family History: Family History  Problem Relation Age of Onset  . Cancer Father   . Hyperlipidemia Father   . Hypertension Father     Social History   Socioeconomic History  . Marital status: Married    Spouse name: Not on file  . Number of children: Not on file  . Years of education: Not on file  . Highest education level: Not on file  Occupational History  . Not on file  Social Needs  . Financial resource strain: Not on file  . Food insecurity:    Worry: Not on file    Inability: Not on file  . Transportation needs:    Medical: Not on file    Non-medical: Not on file  Tobacco Use  . Smoking status: Never Smoker  . Smokeless tobacco: Never Used  Substance and Sexual Activity  . Alcohol use: No  . Drug use: No  . Sexual activity: Not on file  Lifestyle  . Physical activity:    Days per week: Not on file    Minutes per session: Not on file  . Stress: Not on file  Relationships  . Social connections:    Talks on phone: Not on file    Gets together: Not on file    Attends religious  service: Not on file    Active member of club or organization: Not on file    Attends meetings of clubs or organizations: Not on file    Relationship status: Not on file  . Intimate partner violence:    Fear of current or ex partner: Not on file    Emotionally abused: Not on file    Physically abused: Not on file    Forced sexual activity: Not on file  Other Topics Concern  . Not on file  Social History Narrative  . Not on file      Review of Systems  Constitutional: Negative for activity change, chills, fatigue and unexpected weight change.  HENT: Positive for ear pain and sinus pain. Negative for congestion,  postnasal drip, rhinorrhea, sneezing and sore throat.        Intermittent   Eyes: Negative.  Negative for redness.  Respiratory: Negative for cough, chest tightness and shortness of breath.   Cardiovascular: Negative for chest pain and palpitations.  Gastrointestinal: Negative for abdominal pain, constipation, diarrhea, nausea and vomiting.  Endocrine: Negative for cold intolerance, heat intolerance, polydipsia, polyphagia and polyuria.  Genitourinary: Negative.  Negative for dysuria and frequency.  Musculoskeletal: Negative for arthralgias, back pain, joint swelling and neck pain.  Skin: Negative for rash.  Allergic/Immunologic: Positive for environmental allergies.  Neurological: Positive for dizziness. Negative for tremors, numbness and headaches.       Intermittent.   Hematological: Negative for adenopathy. Does not bruise/bleed easily.  Psychiatric/Behavioral: Positive for sleep disturbance. Negative for behavioral problems (Depression) and suicidal ideas. The patient is nervous/anxious.     Vital Signs: BP 124/80   Pulse 66   Resp 16   Ht 5\' 2"  (1.575 m)   Wt 139 lb (63 kg)   SpO2 98%   BMI 25.42 kg/m    Physical Exam  Constitutional: She is oriented to person, place, and time. She appears well-developed and well-nourished. No distress.  HENT:  Head: Normocephalic and atraumatic.  Nose: Nose normal.  Mouth/Throat: Oropharynx is clear and moist. No oropharyngeal exudate.  Some wax present in left outer ear canal. Otherwise, both canals are normal.   Eyes: Pupils are equal, round, and reactive to light. EOM are normal.  Neck: Normal range of motion. Neck supple. No JVD present. Carotid bruit is not present. No tracheal deviation present. No thyromegaly present.  Cardiovascular: Normal rate, regular rhythm and normal heart sounds. Exam reveals no gallop and no friction rub.  No murmur heard. Pulmonary/Chest: Effort normal and breath sounds normal. No respiratory distress.  She has no wheezes. She has no rales. She exhibits no tenderness.  Abdominal: Soft. Bowel sounds are normal. There is no tenderness.  Musculoskeletal: Normal range of motion.  Lymphadenopathy:    She has no cervical adenopathy.  Neurological: She is alert and oriented to person, place, and time. No cranial nerve deficit.  Skin: Skin is warm and dry. She is not diaphoretic.  Psychiatric: She has a normal mood and affect. Her behavior is normal. Judgment and thought content normal.  Nursing note and vitals reviewed.  Assessment/Plan:  1. Essential hypertension Stable. Continue amlodipine as prescribed  - amLODipine (NORVASC) 2.5 MG tablet; Take 1 tablet (2.5 mg total) by mouth daily.  Dispense: 90 tablet; Refill: 4  2. Acquired hypothyroidism Labs ordered to check thyroid panel. Adjust synthroid as indicated  - levothyroxine (SYNTHROID, LEVOTHROID) 100 MCG tablet; Take 1 tablet (100 mcg total) by mouth daily before breakfast.  Dispense: 90 tablet; Refill:  4  3. Unspecified menopausal and perimenopausal disorder - estradiol (ESTRACE) 0.5 MG tablet; Take 1 tablet (0.5 mg total) by mouth daily.  Dispense: 90 tablet; Refill: 4  4. Generalized anxiety disorder May take alprazolam 0.25mg  at bedtime if needed. New prescription sent to her pharamcy.  - ALPRAZolam (XANAX) 0.25 MG tablet; Take 1 tablet (0.25 mg total) by mouth at bedtime as needed.  Dispense: 30 tablet; Refill: 5  General Counseling: stephnie parlier understanding of the findings of todays visit and agrees with plan of treatment. I have discussed any further diagnostic evaluation that may be needed or ordered today. We also reviewed her medications today. she has been encouraged to call the office with any questions or concerns that should arise related to todays visit.    Meds ordered this encounter  Medications  . ALPRAZolam (XANAX) 0.25 MG tablet    Sig: Take 1 tablet (0.25 mg total) by mouth at bedtime as needed.     Dispense:  30 tablet    Refill:  5    Order Specific Question:   Supervising Provider    Answer:   [name] [[id number]]  . amLODipine (NORVASC) 2.5 MG tablet    Sig: Take 1 tablet (2.5 mg total) by mouth daily.    Dispense:  90 tablet    Refill:  4    Order Specific Question:   Supervising Provider    Answer:   [name] [[id number]]  . estradiol (ESTRACE) 0.5 MG tablet    Sig: Take 1 tablet (0.5 mg total) by mouth daily.    Dispense:  90 tablet    Refill:  4    Please consider 90 day supplies to promote better adherence    Order Specific Question:   Supervising Provider    Answer:   [name] [[id number]]  . levothyroxine (SYNTHROID, LEVOTHROID) 100 MCG tablet    Sig: Take 1 tablet (100 mcg total) by mouth daily before breakfast.    Dispense:  90 tablet    Refill:  4    Please consider 90 day supplies to promote better adherence    Order Specific Question:   Supervising Provider    Answer:   [name] [[id number]]    This patient was seen by Vincent Gros FNP Collaboration with Dr [name] as a part of collaborative care agreement   Time spent:15 Minutes      Dr [name] Internal medicine

## 2018-02-18 ENCOUNTER — Other Ambulatory Visit: Payer: Self-pay | Admitting: Nurse Practitioner

## 2018-02-18 DIAGNOSIS — Z0001 Encounter for general adult medical examination with abnormal findings: Secondary | ICD-10-CM | POA: Diagnosis not present

## 2018-02-18 DIAGNOSIS — I10 Essential (primary) hypertension: Secondary | ICD-10-CM | POA: Diagnosis not present

## 2018-02-18 DIAGNOSIS — E559 Vitamin D deficiency, unspecified: Secondary | ICD-10-CM | POA: Diagnosis not present

## 2018-02-18 DIAGNOSIS — E782 Mixed hyperlipidemia: Secondary | ICD-10-CM | POA: Diagnosis not present

## 2018-02-19 ENCOUNTER — Encounter: Payer: Self-pay | Admitting: Adult Health

## 2018-02-19 ENCOUNTER — Ambulatory Visit (INDEPENDENT_AMBULATORY_CARE_PROVIDER_SITE_OTHER): Payer: PPO | Admitting: Adult Health

## 2018-02-19 VITALS — BP 144/80 | HR 71 | Resp 16 | Ht 62.0 in | Wt 137.4 lb

## 2018-02-19 DIAGNOSIS — M1990 Unspecified osteoarthritis, unspecified site: Secondary | ICD-10-CM | POA: Diagnosis not present

## 2018-02-19 DIAGNOSIS — R300 Dysuria: Secondary | ICD-10-CM

## 2018-02-19 DIAGNOSIS — Z23 Encounter for immunization: Secondary | ICD-10-CM | POA: Diagnosis not present

## 2018-02-19 DIAGNOSIS — F411 Generalized anxiety disorder: Secondary | ICD-10-CM | POA: Diagnosis not present

## 2018-02-19 DIAGNOSIS — E039 Hypothyroidism, unspecified: Secondary | ICD-10-CM

## 2018-02-19 DIAGNOSIS — Z0001 Encounter for general adult medical examination with abnormal findings: Secondary | ICD-10-CM | POA: Diagnosis not present

## 2018-02-19 DIAGNOSIS — I10 Essential (primary) hypertension: Secondary | ICD-10-CM

## 2018-02-19 LAB — COMPREHENSIVE METABOLIC PANEL
A/G RATIO: 1.3 (ref 1.2–2.2)
ALBUMIN: 4.3 g/dL (ref 3.6–4.8)
ALT: 20 IU/L (ref 0–32)
AST: 23 IU/L (ref 0–40)
Alkaline Phosphatase: 124 IU/L — ABNORMAL HIGH (ref 39–117)
BUN / CREAT RATIO: 15 (ref 12–28)
BUN: 12 mg/dL (ref 8–27)
Bilirubin Total: 0.4 mg/dL (ref 0.0–1.2)
CALCIUM: 9.5 mg/dL (ref 8.7–10.3)
CO2: 26 mmol/L (ref 20–29)
Chloride: 101 mmol/L (ref 96–106)
Creatinine, Ser: 0.79 mg/dL (ref 0.57–1.00)
GFR, EST AFRICAN AMERICAN: 88 mL/min/{1.73_m2} (ref >59)
GFR, EST NON AFRICAN AMERICAN: 77 mL/min/{1.73_m2} (ref >59)
GLUCOSE: 99 mg/dL (ref 65–99)
Globulin, Total: 3.3 g/dL (ref 1.5–4.5)
Potassium: 3.6 mmol/L (ref 3.5–5.2)
Sodium: 141 mmol/L (ref 134–144)
TOTAL PROTEIN: 7.6 g/dL (ref 6.0–8.5)

## 2018-02-19 LAB — CBC
HEMATOCRIT: 46.1 % (ref 34.0–46.6)
HEMOGLOBIN: 15.9 g/dL (ref 11.1–15.9)
MCH: 29.6 pg (ref 26.6–33.0)
MCHC: 34.5 g/dL (ref 31.5–35.7)
MCV: 86 fL (ref 79–97)
Platelets: 210 10*3/uL (ref 150–450)
RBC: 5.37 x10E6/uL — ABNORMAL HIGH (ref 3.77–5.28)
RDW: 15.2 % (ref 12.3–15.4)
WBC: 6.3 10*3/uL (ref 3.4–10.8)

## 2018-02-19 LAB — LIPID PANEL W/O CHOL/HDL RATIO
Cholesterol, Total: 191 mg/dL (ref 100–199)
HDL: 45 mg/dL (ref >39)
LDL CALC: 111 mg/dL — AB (ref 0–99)
Triglycerides: 175 mg/dL — ABNORMAL HIGH (ref 0–149)
VLDL Cholesterol Cal: 35 mg/dL (ref 5–40)

## 2018-02-19 LAB — T4, FREE: FREE T4: 1.24 ng/dL (ref 0.82–1.77)

## 2018-02-19 LAB — VITAMIN D 25 HYDROXY (VIT D DEFICIENCY, FRACTURES): Vit D, 25-Hydroxy: 28.4 ng/mL — ABNORMAL LOW (ref 30.0–100.0)

## 2018-02-19 LAB — TSH: TSH: 1.77 u[IU]/mL (ref 0.450–4.500)

## 2018-02-19 MED ORDER — MELOXICAM 15 MG PO TABS: 15.0000 mg | ORAL_TABLET | Freq: Every day | ORAL | 3 refills | Status: DC

## 2018-02-19 NOTE — Progress Notes (Addendum)
[facility] [address] [place], [place] [address]  Internal MEDICINE  Office Visit Note  Patient Name: Michelle [name]  [id number]  [id number]  Date of Service: 02/19/2018  Chief Complaint  Patient presents with  . Medicare Wellness    6 month annual well visit  . Hyperthyroidism  . Hypertension     HPI Pt is here for routine health maintenance examination. She is a 67 yo well appearing female.  She generally feels good.  She denies pain or need at this time.  She has chronic shoulder/ neck and arm pain due to arthritis.  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  She denies tobacco, alcohol or illicit drug use.  She is up to date on her health maintenance. Her history is remarkable for HTN, and hyperthyroid.      Current Medication: Outpatient Encounter Medications as of 02/19/2018  Medication Sig  . ALPRAZolam (XANAX) 0.25 MG tablet Take 1 tablet (0.25 mg total) by mouth at bedtime as needed.  Marland Kitchen amLODipine (NORVASC) 2.5 MG tablet Take 1 tablet (2.5 mg total) by mouth daily.  Marland Kitchen estradiol (ESTRACE) 0.5 MG tablet Take 1 tablet (0.5 mg total) by mouth daily.  . fluticasone (FLONASE) 50 MCG/ACT nasal spray Place 1 spray into both nostrils daily as needed.  Marland Kitchen levothyroxine (SYNTHROID, LEVOTHROID) 100 MCG tablet Take 1 tablet (100 mcg total) by mouth daily before breakfast.   No facility-administered encounter medications on file as of 02/19/2018.     Surgical History: Past Surgical History:  Procedure Laterality Date  . child birth natural    . diatation      Medical History: Past Medical History:  Diagnosis Date  . Bladder incontinence   . Hypertension   . Hypothyroidism     Family History: Family History  Problem Relation Age of Onset  . Cancer Father   . Hyperlipidemia Father   . Hypertension Father       Review of Systems  Constitutional: Negative for chills, fatigue and unexpected weight change.  HENT: Negative for congestion,  rhinorrhea, sneezing and sore throat.   Eyes: Negative for photophobia, pain and redness.  Respiratory: Negative for cough, chest tightness and shortness of breath.   Cardiovascular: Negative for chest pain and palpitations.  Gastrointestinal: Negative for abdominal pain, constipation, diarrhea, nausea and vomiting.  Endocrine: Negative.   Genitourinary: Negative for dysuria and frequency.  Musculoskeletal: Negative for arthralgias, back pain, joint swelling and neck pain.  Skin: Negative for rash.  Allergic/Immunologic: Negative.   Neurological: Negative for tremors and numbness.  Hematological: Negative for adenopathy. Does not bruise/bleed easily.  Psychiatric/Behavioral: Negative for behavioral problems and sleep disturbance. The patient is not nervous/anxious.      Vital Signs: BP (!) 144/80 (BP Location: Right Arm, Patient Position: Sitting, Cuff Size: Normal)   Pulse 71   Resp 16   Ht 5\' 2"  (1.575 m)   Wt 137 lb 6.4 oz (62.3 kg)   SpO2 97%   BMI 25.13 kg/m    Physical Exam  Constitutional: She is oriented to person, place, and time. She appears well-developed and well-nourished. No distress.  HENT:  Head: Normocephalic and atraumatic.  Mouth/Throat: Oropharynx is clear and moist. No oropharyngeal exudate.  Eyes: Pupils are equal, round, and reactive to light. EOM are normal.  Neck: Normal range of motion. Neck supple. No JVD present. No tracheal deviation present. No thyromegaly present.  Cardiovascular: Normal rate, regular rhythm and normal heart sounds. Exam reveals  no gallop and no friction rub.  No murmur heard. Pulmonary/Chest: Effort normal and breath sounds normal. No respiratory distress. She has no wheezes. She has no rales. She exhibits no mass and no tenderness. No breast swelling, tenderness, discharge or bleeding. Breasts are symmetrical.  Chaperoned by Inetta Fermo CMA  Abdominal: Soft. There is no tenderness. There is no guarding.  Musculoskeletal: Normal range  of motion.  Lymphadenopathy:    She has no cervical adenopathy.  Neurological: She is alert and oriented to person, place, and time. No cranial nerve deficit.  Skin: Skin is warm and dry. She is not diaphoretic.  Psychiatric: She has a normal mood and affect. Her behavior is normal. Judgment and thought content normal.  Nursing note and vitals reviewed.    LABS: Recent Results (from the past 2160 hour(s))  Comprehensive metabolic panel     Status: None (Preliminary result)   Collection Time: 02/18/18  9:04 AM  Result Value Ref Range   Glucose WILL FOLLOW    BUN WILL FOLLOW    Creatinine, Ser WILL FOLLOW    GFR calc non Af Amer WILL FOLLOW    GFR calc Af Amer WILL FOLLOW    BUN/Creatinine Ratio WILL FOLLOW    Sodium WILL FOLLOW    Potassium WILL FOLLOW    Chloride WILL FOLLOW    CO2 WILL FOLLOW    Calcium WILL FOLLOW    Total Protein WILL FOLLOW    Albumin WILL FOLLOW    Globulin, Total WILL FOLLOW    Albumin/Globulin Ratio WILL FOLLOW    Bilirubin Total WILL FOLLOW    Alkaline Phosphatase WILL FOLLOW    AST WILL FOLLOW    ALT WILL FOLLOW   CBC     Status: Abnormal   Collection Time: 02/18/18  9:04 AM  Result Value Ref Range   WBC 6.3 3.4 - 10.8 x10E3/uL   RBC 5.37 (H) 3.77 - 5.28 x10E6/uL   Hemoglobin 15.9 11.1 - 15.9 g/dL   Hematocrit 16.1 09.6 - 46.6 %   MCV 86 79 - 97 fL   MCH 29.6 26.6 - 33.0 pg   MCHC 34.5 31.5 - 35.7 g/dL   RDW 04.5 40.9 - 81.1 %   Platelets 210 150 - 450 x10E3/uL  Lipid Panel w/o Chol/HDL Ratio     Status: None (Preliminary result)   Collection Time: 02/18/18  9:04 AM  Result Value Ref Range   Cholesterol, Total WILL FOLLOW    Triglycerides WILL FOLLOW    HDL WILL FOLLOW    VLDL Cholesterol Cal WILL FOLLOW    LDL Calculated WILL FOLLOW    Comment: WILL FOLLOW   T4, free     Status: None (Preliminary result)   Collection Time: 02/18/18  9:04 AM  Result Value Ref Range   Free T4 WILL FOLLOW   TSH     Status: None (Preliminary result)    Collection Time: 02/18/18  9:04 AM  Result Value Ref Range   TSH WILL FOLLOW   VITAMIN D 25 Hydroxy (Vit-D Deficiency, Fractures)     Status: None (Preliminary result)   Collection Time: 02/18/18  9:04 AM  Result Value Ref Range   Vit D, 25-Hydroxy WILL FOLLOW    Depression screen [facility] 2/9 02/19/2018 12/28/2017 08/20/2017  Decreased Interest 0 0 0  Down, Depressed, Hopeless 0 0 0  PHQ - 2 Score 0 0 0    Functional Status Survey: Is the patient deaf or have difficulty hearing?: No Does the patient  have difficulty seeing, even when wearing glasses/contacts?: No Does the patient have difficulty concentrating, remembering, or making decisions?: Yes Does the patient have difficulty walking or climbing stairs?: No Does the patient have difficulty dressing or bathing?: No Does the patient have difficulty doing errands alone such as visiting a doctor's office or shopping?: No  MMSE - Mini Mental State Exam 02/19/2018  Orientation to time 5  Orientation to Place 5  Registration 3  Attention/ Calculation 5  Recall 3  Language- name 2 objects 2  Language- repeat 1  Language- follow 3 step command 3  Language- read & follow direction 1  Write a sentence 1  Copy design 1  Total score 30    Fall Risk  02/19/2018 12/28/2017 08/20/2017  Falls in the past year? No No No    Assessment/Plan: 1. Encounter for general adult medical examination with abnormal findings PHM up to date.  I have reviewed the labs that are available at this time. Some are still awaiting results.    2. Needs flu shot - Flu Vaccine MDCK QUAD PF  3. Essential hypertension Elevated today.  Repeated in room 138/86.  Will continue to follow.    4. Acquired hypothyroidism Will follow labs.   5. Generalized anxiety disorder Doing well.  Continue to use Xanax as directed.   6. Arthritis - meloxicam (MOBIC) 15 MG tablet; Take 1 tablet (15 mg total) by mouth daily.  Dispense: 90 tablet; Refill: 3  7. Dysuria - UA/M w/rflx  Culture, Routine  General Counseling: senovia gauer understanding of the findings of todays visit and agrees with plan of treatment. I have discussed any further diagnostic evaluation that may be needed or ordered today. We also reviewed her medications today. she has been encouraged to call the office with any questions or concerns that should arise related to todays visit.   Orders Placed This Encounter  Procedures  . Flu Vaccine MDCK QUAD PF  . UA/M w/rflx Culture, Routine     Time spent: 30 Minutes   This patient was seen by Blima Ledger AGNP-C in Collaboration with Dr [name] as a part of collaborative care agreement   Johnna Acosta AGNP-C  Internal Medicine

## 2018-02-19 NOTE — Patient Instructions (Signed)

## 2018-02-20 LAB — MICROSCOPIC EXAMINATION

## 2018-02-20 LAB — UA/M W/RFLX CULTURE, ROUTINE
BILIRUBIN UA: NEGATIVE
GLUCOSE, UA: NEGATIVE
KETONES UA: NEGATIVE
LEUKOCYTES UA: NEGATIVE
Nitrite, UA: NEGATIVE
PROTEIN UA: NEGATIVE
RBC, UA: NEGATIVE
SPEC GRAV UA: 1.016 (ref 1.005–1.030)
UUROB: 0.2 mg/dL (ref 0.2–1.0)
pH, UA: 6 (ref 5.0–7.5)

## 2018-03-06 ENCOUNTER — Ambulatory Visit
Admission: RE | Admit: 2018-03-06 | Discharge: 2018-03-06 | Disposition: A | Discharge status: Home / Self Care | Payer: PPO | Source: Ambulatory Visit | Attending: Nurse Practitioner | Admitting: Nurse Practitioner

## 2018-03-06 DIAGNOSIS — Z1231 Encounter for screening mammogram for malignant neoplasm of breast: Secondary | ICD-10-CM | POA: Diagnosis not present

## 2018-03-06 DIAGNOSIS — Z1239 Encounter for other screening for malignant neoplasm of breast: Secondary | ICD-10-CM

## 2018-05-31 ENCOUNTER — Encounter: Payer: Self-pay | Admitting: Nurse Practitioner

## 2018-05-31 ENCOUNTER — Ambulatory Visit (INDEPENDENT_AMBULATORY_CARE_PROVIDER_SITE_OTHER): Payer: PPO | Admitting: Nurse Practitioner

## 2018-05-31 VITALS — BP 150/88 | HR 75 | Temp 96.5°F | Resp 16 | Ht 62.0 in | Wt 138.0 lb

## 2018-05-31 DIAGNOSIS — I10 Essential (primary) hypertension: Secondary | ICD-10-CM

## 2018-05-31 DIAGNOSIS — R05 Cough: Secondary | ICD-10-CM

## 2018-05-31 DIAGNOSIS — J209 Acute bronchitis, unspecified: Secondary | ICD-10-CM | POA: Diagnosis not present

## 2018-05-31 DIAGNOSIS — R059 Cough, unspecified: Secondary | ICD-10-CM

## 2018-05-31 MED ORDER — PROMETHAZINE-CODEINE 6.25-10 MG/5ML PO SYRP: 5.0000 mL | ORAL_SOLUTION | Freq: Three times a day (TID) | ORAL | 0 refills | Status: DC | PRN

## 2018-05-31 MED ORDER — AMOXICILLIN-POT CLAVULANATE 875-125 MG PO TABS: 1.0000 | ORAL_TABLET | Freq: Two times a day (BID) | ORAL | 0 refills | Status: DC

## 2018-05-31 NOTE — Progress Notes (Signed)
[facility] [address] [place], [place] [address]  Internal MEDICINE  Office Visit Note  Patient Name: Michelle [name]  [id number]  [id number]  Date of Service: 06/05/2018   Pt is here for a sick visit.  Chief Complaint  Patient presents with  . Cough    going for 2 weeks   . Wheezing  . Hypertension     Cough  This is a new problem. The current episode started in the past 7 days. The problem has been rapidly worsening. The problem occurs every few minutes. The cough is productive of sputum. Associated symptoms include chills, ear congestion, headaches, myalgias, nasal congestion, postnasal drip, rhinorrhea, a sore throat and wheezing. Pertinent negatives include no chest pain, fever, rash or shortness of breath. She has tried cool air, OTC cough suppressant and rest for the symptoms. The treatment provided no relief. Her past medical history is significant for environmental allergies.        Current Medication:  Outpatient Encounter Medications as of 05/31/2018  Medication Sig  . ALPRAZolam (XANAX) 0.25 MG tablet Take 1 tablet (0.25 mg total) by mouth at bedtime as needed.  Marland Kitchen amLODipine (NORVASC) 2.5 MG tablet Take 1 tablet (2.5 mg total) by mouth daily.  Marland Kitchen estradiol (ESTRACE) 0.5 MG tablet Take 1 tablet (0.5 mg total) by mouth daily.  . fluticasone (FLONASE) 50 MCG/ACT nasal spray Place 1 spray into both nostrils daily as needed.  Marland Kitchen levothyroxine (SYNTHROID, LEVOTHROID) 100 MCG tablet Take 1 tablet (100 mcg total) by mouth daily before breakfast.  . meloxicam (MOBIC) 15 MG tablet Take 1 tablet (15 mg total) by mouth daily.  Marland Kitchen amoxicillin-clavulanate (AUGMENTIN) 875-125 MG tablet Take 1 tablet by mouth 2 (two) times daily.  . promethazine-codeine (PHENERGAN WITH CODEINE) 6.25-10 MG/5ML syrup Take 5 mLs by mouth every 8 (eight) hours as needed for cough.   No facility-administered encounter medications on file as of 05/31/2018.       Medical  History: Past Medical History:  Diagnosis Date  . Bladder incontinence   . Hypertension   . Hypothyroidism     Today's Vitals   05/31/18 1548  BP: (!) 150/88  Pulse: 75  Resp: 16  Temp: (!) 96.5 F (35.8 C)  SpO2: 95%  Weight: 138 lb (62.6 kg)  Height: 5\' 2"  (1.575 m)    Review of Systems  Constitutional: Positive for chills and fatigue. Negative for fever.  HENT: Positive for congestion, postnasal drip, rhinorrhea, sinus pressure, sinus pain, sore throat and voice change.   Respiratory: Positive for cough and wheezing. Negative for chest tightness and shortness of breath.   Cardiovascular: Negative for chest pain and palpitations.  Gastrointestinal: Negative for nausea and vomiting.  Musculoskeletal: Positive for back pain and myalgias.  Skin: Negative for rash.  Allergic/Immunologic: Positive for environmental allergies.  Neurological: Positive for headaches.  Hematological: Negative.     Physical Exam Vitals signs and nursing note reviewed.  Constitutional:      General: She is not in acute distress.    Appearance: She is well-developed. She is ill-appearing. She is not diaphoretic.  HENT:     Head: Normocephalic and atraumatic.     Right Ear: Tympanic membrane is erythematous and bulging.     Left Ear: Tympanic membrane is erythematous and bulging.     Nose: Congestion and rhinorrhea present. Rhinorrhea is clear.     Right Sinus: Maxillary sinus tenderness present.     Left Sinus: Maxillary sinus tenderness present.  Mouth/Throat:     Pharynx: Posterior oropharyngeal erythema present. No oropharyngeal exudate.  Eyes:     Pupils: Pupils are equal, round, and reactive to light.  Neck:     Musculoskeletal: Normal range of motion and neck supple.     Thyroid: No thyromegaly.     Vascular: No JVD.     Trachea: No tracheal deviation.  Cardiovascular:     Rate and Rhythm: Normal rate and regular rhythm.     Heart sounds: Normal heart sounds. No murmur. No  friction rub. No gallop.   Pulmonary:     Effort: Pulmonary effort is normal. No respiratory distress.     Breath sounds: No wheezing or rales.     Comments: Congested, non-productive cough present.  Chest:     Chest wall: No tenderness.  Abdominal:     General: Bowel sounds are normal.     Palpations: Abdomen is soft.  Musculoskeletal: Normal range of motion.  Lymphadenopathy:     Cervical: Cervical adenopathy present.  Skin:    General: Skin is warm and dry.  Neurological:     Mental Status: She is alert and oriented to person, place, and time.     Cranial Nerves: No cranial nerve deficit.  Psychiatric:        Behavior: Behavior normal.        Thought Content: Thought content normal.        Judgment: Judgment normal.    Assessment/Plan: 1. Acute bronchitis, unspecified organism Tart augmentin 875mg  bid for 10 days. Rest and increase fluids. Recommend OTC medications as needed and as indicated to improve symptoms.  - amoxicillin-clavulanate (AUGMENTIN) 875-125 MG tablet; Take 1 tablet by mouth 2 (two) times daily.  Dispense: 20 tablet; Refill: 0  2. Cough Promethazine/codeine cough suppressant may be taken three times daily if needed for cough. Advised patient not to overuse this medicine and not to mix with other medications or alcohol as it can cause respiratory distress, sleepiness or dizziness. Should also avoid driving. Patient voiced understanding and agreement.  - promethazine-codeine (PHENERGAN WITH CODEINE) 6.25-10 MG/5ML syrup; Take 5 mLs by mouth every 8 (eight) hours as needed for cough.  Dispense: 120 mL; Refill: 0  3. Essential hypertension Continue bp medication as prescribed .  General Counseling: Michelle GroveFrances verbalizes understanding of the findings of todays visit and agrees with plan of treatment. I have discussed any further diagnostic evaluation that may be needed or ordered today. We also reviewed her medications today. she has been encouraged to call the office  with any questions or concerns that should arise related to todays visit.    Counseling:  Rest and increase fluids. Continue using OTC medication to control symptoms.   This patient was seen by Vincent GrosHeather Gopal Malter FNP Collaboration with Dr Lyndon CodeFozia M Khan as a part of collaborative care agreement  Meds ordered this encounter  Medications  . amoxicillin-clavulanate (AUGMENTIN) 875-125 MG tablet    Sig: Take 1 tablet by mouth 2 (two) times daily.    Dispense:  20 tablet    Refill:  0    Order Specific Question:   Supervising Provider    Answer:   Lyndon CodeKHAN, FOZIA M [[id number]]  . promethazine-codeine (PHENERGAN WITH CODEINE) 6.25-10 MG/5ML syrup    Sig: Take 5 mLs by mouth every 8 (eight) hours as needed for cough.    Dispense:  120 mL    Refill:  0    Patient given GoodRx card to help with cost of cough suppressant.  Order Specific Question:   Supervising Provider    Answer:   Lavera Guise [[id number]]    Time spent: 25 Minutes

## 2018-06-05 DIAGNOSIS — R059 Cough, unspecified: Secondary | ICD-10-CM | POA: Insufficient documentation

## 2018-06-05 DIAGNOSIS — J209 Acute bronchitis, unspecified: Secondary | ICD-10-CM | POA: Insufficient documentation

## 2018-06-05 DIAGNOSIS — R05 Cough: Secondary | ICD-10-CM | POA: Insufficient documentation

## 2018-08-22 ENCOUNTER — Ambulatory Visit (INDEPENDENT_AMBULATORY_CARE_PROVIDER_SITE_OTHER): Payer: PPO | Admitting: Internal Medicine

## 2018-08-22 ENCOUNTER — Other Ambulatory Visit: Payer: Self-pay

## 2018-08-22 ENCOUNTER — Encounter: Payer: Self-pay | Admitting: Nurse Practitioner

## 2018-08-22 DIAGNOSIS — E039 Hypothyroidism, unspecified: Secondary | ICD-10-CM

## 2018-08-22 DIAGNOSIS — F411 Generalized anxiety disorder: Secondary | ICD-10-CM

## 2018-08-22 DIAGNOSIS — M722 Plantar fascial fibromatosis: Secondary | ICD-10-CM | POA: Diagnosis not present

## 2018-08-22 DIAGNOSIS — J45909 Unspecified asthma, uncomplicated: Secondary | ICD-10-CM | POA: Diagnosis not present

## 2018-08-22 DIAGNOSIS — G40909 Epilepsy, unspecified, not intractable, without status epilepticus: Secondary | ICD-10-CM | POA: Diagnosis not present

## 2018-08-22 DIAGNOSIS — K219 Gastro-esophageal reflux disease without esophagitis: Secondary | ICD-10-CM | POA: Diagnosis not present

## 2018-08-22 DIAGNOSIS — I10 Essential (primary) hypertension: Secondary | ICD-10-CM

## 2018-08-22 DIAGNOSIS — M7741 Metatarsalgia, right foot: Secondary | ICD-10-CM | POA: Diagnosis not present

## 2018-08-22 DIAGNOSIS — G5781 Other specified mononeuropathies of right lower limb: Secondary | ICD-10-CM | POA: Diagnosis not present

## 2018-08-22 DIAGNOSIS — G4733 Obstructive sleep apnea (adult) (pediatric): Secondary | ICD-10-CM | POA: Diagnosis not present

## 2018-08-22 DIAGNOSIS — Z79899 Other long term (current) drug therapy: Secondary | ICD-10-CM | POA: Diagnosis not present

## 2018-08-22 DIAGNOSIS — M2011 Hallux valgus (acquired), right foot: Secondary | ICD-10-CM | POA: Diagnosis not present

## 2018-08-22 DIAGNOSIS — M2041 Other hammer toe(s) (acquired), right foot: Secondary | ICD-10-CM | POA: Diagnosis not present

## 2018-08-22 MED ORDER — ALPRAZOLAM 0.25 MG PO TABS: 0.2500 mg | ORAL_TABLET | Freq: Every evening | ORAL | 1 refills | Status: DC | PRN

## 2018-08-22 NOTE — Progress Notes (Signed)
[facility] [address] [place], [place] [address]  Internal MEDICINE  Telephone Visit  Patient Name: Michelle [name]  [id number]  [id number]  Date of Service: 08/22/2018  I connected with the patient at 1000 by telephone and verified the patients identity using two identifiers.   I discussed the limitations, risks, security and privacy concerns of performing an evaluation and management service by telephone and the availability of in person appointments. I also discussed with the patient that there may be a patient responsible charge related to the service.  The patient expressed understanding and agrees to proceed.    Chief Complaint  Patient presents with  . Hypertension  . Hypothyroidism  . Follow-up  . Telephone Screen    spoke with pt     HPI  Feels to her baseline. concerned about refills on her medications, she will like to have lab work done after the pandemic is over   Current Medication: Outpatient Encounter Medications as of 08/22/2018  Medication Sig  . ALPRAZolam (XANAX) 0.25 MG tablet Take 1 tablet (0.25 mg total) by mouth at bedtime as needed.  Marland Kitchen amLODipine (NORVASC) 2.5 MG tablet Take 1 tablet (2.5 mg total) by mouth daily.  Marland Kitchen amoxicillin-clavulanate (AUGMENTIN) 875-125 MG tablet Take 1 tablet by mouth 2 (two) times daily.  Marland Kitchen estradiol (ESTRACE) 0.5 MG tablet Take 1 tablet (0.5 mg total) by mouth daily.  . fluticasone (FLONASE) 50 MCG/ACT nasal spray Place 1 spray into both nostrils daily as needed.  Marland Kitchen levothyroxine (SYNTHROID, LEVOTHROID) 100 MCG tablet Take 1 tablet (100 mcg total) by mouth daily before breakfast.  . meloxicam (MOBIC) 15 MG tablet Take 1 tablet (15 mg total) by mouth daily.  . promethazine-codeine (PHENERGAN WITH CODEINE) 6.25-10 MG/5ML syrup Take 5 mLs by mouth every 8 (eight) hours as needed for cough.  . Zoster Vaccine Live, PF, (ZOSTAVAX) 85929 UNT/0.65ML injection   . [DISCONTINUED] ALPRAZolam (XANAX) 0.25 MG tablet Take 1  tablet (0.25 mg total) by mouth at bedtime as needed.   No facility-administered encounter medications on file as of 08/22/2018.     Surgical History: Past Surgical History:  Procedure Laterality Date  . child birth natural    . diatation      Medical History: Past Medical History:  Diagnosis Date  . Bladder incontinence   . Hypertension   . Hypothyroidism     Family History: Family History  Problem Relation Age of Onset  . Cancer Father   . Hyperlipidemia Father   . Hypertension Father     Social History   Socioeconomic History  . Marital status: Married    Spouse name: Not on file  . Number of children: Not on file  . Years of education: Not on file  . Highest education level: Not on file  Occupational History  . Not on file  Social Needs  . Financial resource strain: Not on file  . Food insecurity:    Worry: Not on file    Inability: Not on file  . Transportation needs:    Medical: Not on file    Non-medical: Not on file  Tobacco Use  . Smoking status: Never Smoker  . Smokeless tobacco: Never Used  Substance and Sexual Activity  . Alcohol use: No  . Drug use: No  . Sexual activity: Not on file  Lifestyle  . Physical activity:    Days per week: Not on file    Minutes per session: Not on file  . Stress: Not  on file  Relationships  . Social connections:    Talks on phone: Not on file    Gets together: Not on file    Attends religious service: Not on file    Active member of club or organization: Not on file    Attends meetings of clubs or organizations: Not on file    Relationship status: Not on file  . Intimate partner violence:    Fear of current or ex partner: Not on file    Emotionally abused: Not on file    Physically abused: Not on file    Forced sexual activity: Not on file  Other Topics Concern  . Not on file  Social History Narrative  . Not on file    Review of Systems  Vital Signs: BP 125/75   Pulse 74   Temp 97.7 F (36.5 C)    Resp 16   Ht 5\' 2"  (1.575 m)   Wt 138 lb (62.6 kg)   BMI 25.24 kg/m   Observation/Objective: Pt tool her Vitals at home No new complaints  Takes all her medications as prescribed   Assessment/Plan: 1. Essential hypertension - Controlled with meds   2. Acquired hypothyroidism - Continue Synthroid   3. Generalized anxiety disorder - ALPRAZolam (XANAX) 0.25 MG tablet; Take 1 tablet (0.25 mg total) by mouth at bedtime as needed.  Dispense: 30 tablet; Refill: 1  General Counseling: marilyn hrabovsky understanding of the findings of today's phone visit and agrees with plan of treatment. I have discussed any further diagnostic evaluation that may be needed or ordered today. We also reviewed her medications today. she has been encouraged to call the office with any questions or concerns that should arise related to todays visit.  Chart was reviewed for this pt. Time spent on chart review is 12 min   Orders Placed This Encounter  Procedures  . Basic metabolic panel  . TSH + free T4    Meds ordered this encounter  Medications  . ALPRAZolam (XANAX) 0.25 MG tablet    Sig: Take 1 tablet (0.25 mg total) by mouth at bedtime as needed.    Dispense:  30 tablet    Refill:  1    Time spent: 109 Minutes   Dr [name] Internal medicine

## 2018-11-25 ENCOUNTER — Ambulatory Visit (INDEPENDENT_AMBULATORY_CARE_PROVIDER_SITE_OTHER): Payer: PPO | Admitting: Nurse Practitioner

## 2018-11-25 ENCOUNTER — Other Ambulatory Visit: Payer: Self-pay

## 2018-11-25 ENCOUNTER — Encounter: Payer: Self-pay | Admitting: Nurse Practitioner

## 2018-11-25 ENCOUNTER — Other Ambulatory Visit: Payer: Self-pay | Admitting: Nurse Practitioner

## 2018-11-25 VITALS — BP 146/96 | HR 71 | Resp 16 | Ht 62.0 in | Wt 138.0 lb

## 2018-11-25 DIAGNOSIS — R5383 Other fatigue: Secondary | ICD-10-CM | POA: Diagnosis not present

## 2018-11-25 DIAGNOSIS — Z79899 Other long term (current) drug therapy: Secondary | ICD-10-CM

## 2018-11-25 DIAGNOSIS — E039 Hypothyroidism, unspecified: Secondary | ICD-10-CM | POA: Diagnosis not present

## 2018-11-25 DIAGNOSIS — I10 Essential (primary) hypertension: Secondary | ICD-10-CM

## 2018-11-25 DIAGNOSIS — F411 Generalized anxiety disorder: Secondary | ICD-10-CM | POA: Diagnosis not present

## 2018-11-25 LAB — POCT URINE DRUG SCREEN
POC Amphetamine UR: NOT DETECTED
POC BENZODIAZEPINES UR: NOT DETECTED
POC Barbiturate UR: NOT DETECTED
POC Cocaine UR: NOT DETECTED
POC Ecstasy UR: NOT DETECTED
POC Marijuana UR: NOT DETECTED
POC Methadone UR: NOT DETECTED
POC Methamphetamine UR: NOT DETECTED
POC Opiate Ur: NOT DETECTED
POC Oxycodone UR: NOT DETECTED
POC PHENCYCLIDINE UR: NOT DETECTED
POC TRICYCLICS UR: NOT DETECTED

## 2018-11-25 MED ORDER — ALPRAZOLAM 0.25 MG PO TABS: 0.2500 mg | ORAL_TABLET | Freq: Every evening | ORAL | 2 refills | Status: DC | PRN

## 2018-11-25 MED ORDER — AMLODIPINE BESYLATE 2.5 MG PO TABS: 2.5000 mg | ORAL_TABLET | Freq: Every day | ORAL | 4 refills | Status: DC

## 2018-11-25 NOTE — Progress Notes (Signed)
[facility] [address] [place], [place] [id number]  Internal MEDICINE  Office Visit Note  Patient Name: Michelle [name]  [id number] 19, 2052  [id number]  Date of Service: 11/25/2018  Chief Complaint  Patient presents with  . Anxiety  . Hypothyroidism  . Hypertension    The patient is here for routine follow up visit. Today, blood pressure is slightly elevated. She is complaining of fatigue.  She feels down and unmotivated. Does not want to participate in activities which would normally cause her joy. Denies chest pain or pressure. Denies headache. A thyroid panel and BMP were ordered after her last telephone encounter. She has not had these labs done. She intermittently takes alprazolam 0.25mg  at bedtime. The last time she took this was about two weeks ago. New prescription was sent 08/2018.       Current Medication: Outpatient Encounter Medications as of 11/25/2018  Medication Sig  . ALPRAZolam (XANAX) 0.25 MG tablet Take 1 tablet (0.25 mg total) by mouth at bedtime as needed.  Marland Kitchen. amLODipine (NORVASC) 2.5 MG tablet Take 1 tablet (2.5 mg total) by mouth daily.  Marland Kitchen. amoxicillin-clavulanate (AUGMENTIN) 875-125 MG tablet Take 1 tablet by mouth 2 (two) times daily.  Marland Kitchen. estradiol (ESTRACE) 0.5 MG tablet Take 1 tablet (0.5 mg total) by mouth daily.  . fluticasone (FLONASE) 50 MCG/ACT nasal spray Place 1 spray into both nostrils daily as needed.  Marland Kitchen. levothyroxine (SYNTHROID, LEVOTHROID) 100 MCG tablet Take 1 tablet (100 mcg total) by mouth daily before breakfast.  . meloxicam (MOBIC) 15 MG tablet Take 1 tablet (15 mg total) by mouth daily.  . promethazine-codeine (PHENERGAN WITH CODEINE) 6.25-10 MG/5ML syrup Take 5 mLs by mouth every 8 (eight) hours as needed for cough.  . Zoster Vaccine Live, PF, (ZOSTAVAX) [id number] UNT/0.65ML injection   . [DISCONTINUED] ALPRAZolam (XANAX) 0.25 MG tablet Take 1 tablet (0.25 mg total) by mouth at bedtime as needed.  . [DISCONTINUED] amLODipine (NORVASC) 2.5 MG  tablet Take 1 tablet (2.5 mg total) by mouth daily.   No facility-administered encounter medications on file as of 11/25/2018.     Surgical History: Past Surgical History:  Procedure Laterality Date  . child birth natural    . diatation      Medical History: Past Medical History:  Diagnosis Date  . Bladder incontinence   . Hypertension   . Hypothyroidism     Family History: Family History  Problem Relation Age of Onset  . Cancer Father   . Hyperlipidemia Father   . Hypertension Father     Social History   Socioeconomic History  . Marital status: Married    Spouse name: Not on file  . Number of children: Not on file  . Years of education: Not on file  . Highest education level: Not on file  Occupational History  . Not on file  Social Needs  . Financial resource strain: Not on file  . Food insecurity    Worry: Not on file    Inability: Not on file  . Transportation needs    Medical: Not on file    Non-medical: Not on file  Tobacco Use  . Smoking status: Former Games developermoker  . Smokeless tobacco: Never Used  Substance and Sexual Activity  . Alcohol use: No  . Drug use: No  . Sexual activity: Not on file  Lifestyle  . Physical activity    Days per week: Not on file    Minutes per session: Not on file  . Stress: Not  on file  Relationships  . Social Herbalist on phone: Not on file    Gets together: Not on file    Attends religious service: Not on file    Active member of club or organization: Not on file    Attends meetings of clubs or organizations: Not on file    Relationship status: Not on file  . Intimate partner violence    Fear of current or ex partner: Not on file    Emotionally abused: Not on file    Physically abused: Not on file    Forced sexual activity: Not on file  Other Topics Concern  . Not on file  Social History Narrative  . Not on file      Review of Systems  Constitutional: Positive for activity change and fatigue.  Negative for chills and unexpected weight change.  HENT: Negative for congestion, postnasal drip, rhinorrhea, sneezing and sore throat.   Respiratory: Negative for cough, chest tightness, shortness of breath and wheezing.   Cardiovascular: Negative for chest pain and palpitations.  Gastrointestinal: Negative for abdominal pain, constipation, diarrhea, nausea and vomiting.  Endocrine: Negative for cold intolerance, heat intolerance, polydipsia and polyuria.       Generally well controlled thyroid disease.  Musculoskeletal: Negative for arthralgias, back pain, joint swelling and neck pain.  Skin: Negative for rash.  Neurological: Negative for dizziness, tremors, numbness and headaches.  Hematological: Negative for adenopathy. Does not bruise/bleed easily.  Psychiatric/Behavioral: Negative for behavioral problems (Depression), sleep disturbance and suicidal ideas. The patient is nervous/anxious.    Today's Vitals   11/25/18 1026  BP: (!) 146/96  Pulse: 71  Resp: 16  SpO2: 97%  Weight: 138 lb (62.6 kg)  Height: 5\' 2"  (1.575 m)   Body mass index is 25.24 kg/m.  Physical Exam Vitals signs and nursing note reviewed.  Constitutional:      General: She is not in acute distress.    Appearance: Normal appearance. She is well-developed. She is not diaphoretic.  HENT:     Head: Normocephalic and atraumatic.     Mouth/Throat:     Pharynx: No oropharyngeal exudate.  Eyes:     Pupils: Pupils are equal, round, and reactive to light.  Neck:     Musculoskeletal: Normal range of motion and neck supple.     Thyroid: No thyromegaly.     Vascular: No carotid bruit or JVD.     Trachea: No tracheal deviation.  Cardiovascular:     Rate and Rhythm: Normal rate and regular rhythm.     Heart sounds: Normal heart sounds. No murmur. No friction rub. No gallop.   Pulmonary:     Effort: Pulmonary effort is normal. No respiratory distress.     Breath sounds: Normal breath sounds. No wheezing or rales.   Chest:     Chest wall: No tenderness.  Abdominal:     General: Bowel sounds are normal.     Palpations: Abdomen is soft.  Musculoskeletal: Normal range of motion.  Lymphadenopathy:     Cervical: No cervical adenopathy.  Skin:    General: Skin is warm and dry.  Neurological:     Mental Status: She is alert and oriented to person, place, and time.     Cranial Nerves: No cranial nerve deficit.  Psychiatric:        Behavior: Behavior normal.        Thought Content: Thought content normal.        Judgment: Judgment normal.  Assessment/Plan: 1. Acquired hypothyroidism Check thyroid panel and adjust levothyroxine as indicated.   2. Other fatigue Check thyroid panel, CBC, and BMP for further evaluation.   3. Essential hypertension Generally stable. Continue bp medication as prescribed  - amLODipine (NORVASC) 2.5 MG tablet; Take 1 tablet (2.5 mg total) by mouth daily.  Dispense: 90 tablet; Refill: 4  4. Generalized anxiety disorder May take alprazolam 0.25mg  at bedtime as needed for acute anxiety. New prescription sent to her pharmacy.  - ALPRAZolam (XANAX) 0.25 MG tablet; Take 1 tablet (0.25 mg total) by mouth at bedtime as needed.  Dispense: 30 tablet; Refill: 2  5. Encounter for long-term (current) use of high-risk medication - POCT Urine Drug Screen negative for all controlled substances which is appropriate as last time she took alprazolam was over 2 weeks ago.   General Counseling: Michelle [name] verbalizes understanding of the findings of todays visit and agrees with plan of treatment. I have discussed any further diagnostic evaluation that may be needed or ordered today. We also reviewed her medications today. she has been encouraged to call the office with any questions or concerns that should arise related to todays visit.  Hypertension Counseling:   The following hypertensive lifestyle modification were recommended and discussed:  1. Limiting alcohol intake to less than 1  oz/day of ethanol:(24 oz of beer or 8 oz of wine or 2 oz of 100-proof whiskey). 2. Take baby ASA 81 mg daily. 3. Importance of regular aerobic exercise and losing weight. 4. Reduce dietary saturated fat and cholesterol intake for overall cardiovascular health. 5. Maintaining adequate dietary potassium, calcium, and magnesium intake. 6. Regular monitoring of the blood pressure. 7. Reduce sodium intake to less than 100 mmol/day (less than 2.3 gm of sodium or less than 6 gm of sodium choride)   This patient was seen by Vincent GrosHeather Bengie Kaucher FNP Collaboration with Dr Lyndon CodeFozia M Khan as a part of collaborative care agreement  Orders Placed This Encounter  Procedures  . POCT Urine Drug Screen    Meds ordered this encounter  Medications  . ALPRAZolam (XANAX) 0.25 MG tablet    Sig: Take 1 tablet (0.25 mg total) by mouth at bedtime as needed.    Dispense:  30 tablet    Refill:  2    Order Specific Question:   Supervising Provider    Answer:   Lyndon CodeKHAN, FOZIA M [[id number]]  . amLODipine (NORVASC) 2.5 MG tablet    Sig: Take 1 tablet (2.5 mg total) by mouth daily.    Dispense:  90 tablet    Refill:  4    Order Specific Question:   Supervising Provider    Answer:   Lyndon CodeKHAN, FOZIA M [[id number]]    Time spent: 5525 Minutes      Dr Lyndon CodeFozia M Khan Internal medicine

## 2018-11-26 LAB — CBC
Hematocrit: 43.7 % (ref 34.0–46.6)
Hemoglobin: 15 g/dL (ref 11.1–15.9)
MCH: 28.8 pg (ref 26.6–33.0)
MCHC: 34.3 g/dL (ref 31.5–35.7)
MCV: 84 fL (ref 79–97)
Platelets: 205 10*3/uL (ref 150–450)
RBC: 5.21 x10E6/uL (ref 3.77–5.28)
RDW: 14 % (ref 11.7–15.4)
WBC: 7.1 10*3/uL (ref 3.4–10.8)

## 2018-11-26 LAB — BASIC METABOLIC PANEL
BUN/Creatinine Ratio: 22 (ref 12–28)
BUN: 16 mg/dL (ref 8–27)
CO2: 23 mmol/L (ref 20–29)
Calcium: 10 mg/dL (ref 8.7–10.3)
Chloride: 101 mmol/L (ref 96–106)
Creatinine, Ser: 0.73 mg/dL (ref 0.57–1.00)
GFR calc Af Amer: 96 mL/min/{1.73_m2} (ref >59)
GFR calc non Af Amer: 84 mL/min/{1.73_m2} (ref >59)
Glucose: 82 mg/dL (ref 65–99)
Potassium: 3.9 mmol/L (ref 3.5–5.2)
Sodium: 139 mmol/L (ref 134–144)

## 2018-11-26 LAB — T4, FREE: Free T4: 1.6 ng/dL (ref 0.82–1.77)

## 2018-11-26 LAB — TSH: TSH: 0.445 u[IU]/mL — ABNORMAL LOW (ref 0.450–4.500)

## 2018-12-03 ENCOUNTER — Telehealth: Payer: Self-pay | Admitting: Internal Medicine

## 2018-12-03 ENCOUNTER — Other Ambulatory Visit: Payer: Self-pay | Admitting: Nurse Practitioner

## 2018-12-03 DIAGNOSIS — E039 Hypothyroidism, unspecified: Secondary | ICD-10-CM

## 2018-12-03 MED ORDER — LEVOTHYROXINE SODIUM 88 MCG PO TABS: 88.0000 ug | ORAL_TABLET | Freq: Every day | ORAL | 3 refills | Status: DC

## 2018-12-03 NOTE — Progress Notes (Signed)
Decreased dose levothyroxine to 50mcg daily. Will need to recheck thyroid panel in about 8 weeks. Sent new prescription to her pharmacy

## 2018-12-03 NOTE — Progress Notes (Signed)
Decreased dose levothyroxine to 24mcg daily. Will need to recheck thyroid panel in about 8 weeks.

## 2018-12-03 NOTE — Telephone Encounter (Signed)
Pt was advised on labs , pt states that she has been feeling bad for a while now , fatigue and just not feeling right ,

## 2019-01-24 ENCOUNTER — Other Ambulatory Visit: Payer: Self-pay

## 2019-01-24 ENCOUNTER — Other Ambulatory Visit: Payer: Self-pay | Admitting: Adult Health

## 2019-01-24 DIAGNOSIS — E039 Hypothyroidism, unspecified: Secondary | ICD-10-CM

## 2019-01-24 DIAGNOSIS — Z23 Encounter for immunization: Secondary | ICD-10-CM

## 2019-01-24 DIAGNOSIS — M1990 Unspecified osteoarthritis, unspecified site: Secondary | ICD-10-CM

## 2019-01-24 DIAGNOSIS — I10 Essential (primary) hypertension: Secondary | ICD-10-CM

## 2019-01-24 DIAGNOSIS — R300 Dysuria: Secondary | ICD-10-CM

## 2019-01-24 DIAGNOSIS — F411 Generalized anxiety disorder: Secondary | ICD-10-CM

## 2019-01-24 DIAGNOSIS — Z0001 Encounter for general adult medical examination with abnormal findings: Secondary | ICD-10-CM

## 2019-01-24 MED ORDER — LEVOTHYROXINE SODIUM 88 MCG PO TABS: 88.0000 ug | ORAL_TABLET | Freq: Every day | ORAL | 3 refills | Status: DC

## 2019-01-28 ENCOUNTER — Other Ambulatory Visit: Payer: Self-pay | Admitting: Nurse Practitioner

## 2019-01-28 DIAGNOSIS — N959 Unspecified menopausal and perimenopausal disorder: Secondary | ICD-10-CM

## 2019-01-28 MED ORDER — ESTRADIOL 0.5 MG PO TABS: 0.5000 mg | ORAL_TABLET | Freq: Every day | ORAL | 4 refills | Status: DC

## 2019-01-29 ENCOUNTER — Other Ambulatory Visit: Payer: Self-pay

## 2019-01-29 DIAGNOSIS — N959 Unspecified menopausal and perimenopausal disorder: Secondary | ICD-10-CM

## 2019-01-29 MED ORDER — ESTRADIOL 0.5 MG PO TABS: 0.5000 mg | ORAL_TABLET | Freq: Every day | ORAL | 1 refills | Status: DC

## 2019-02-06 DIAGNOSIS — N390 Urinary tract infection, site not specified: Secondary | ICD-10-CM | POA: Diagnosis not present

## 2019-02-24 ENCOUNTER — Ambulatory Visit (INDEPENDENT_AMBULATORY_CARE_PROVIDER_SITE_OTHER): Payer: PPO | Admitting: Nurse Practitioner

## 2019-02-24 ENCOUNTER — Other Ambulatory Visit: Payer: Self-pay

## 2019-02-24 ENCOUNTER — Encounter: Payer: Self-pay | Admitting: Nurse Practitioner

## 2019-02-24 VITALS — BP 132/94 | HR 74 | Temp 97.4°F | Resp 16 | Ht 62.0 in | Wt 140.0 lb

## 2019-02-24 DIAGNOSIS — Z23 Encounter for immunization: Secondary | ICD-10-CM | POA: Diagnosis not present

## 2019-02-24 DIAGNOSIS — R319 Hematuria, unspecified: Secondary | ICD-10-CM

## 2019-02-24 DIAGNOSIS — Z0001 Encounter for general adult medical examination with abnormal findings: Secondary | ICD-10-CM

## 2019-02-24 DIAGNOSIS — R300 Dysuria: Secondary | ICD-10-CM

## 2019-02-24 DIAGNOSIS — I10 Essential (primary) hypertension: Secondary | ICD-10-CM | POA: Diagnosis not present

## 2019-02-24 DIAGNOSIS — F411 Generalized anxiety disorder: Secondary | ICD-10-CM

## 2019-02-24 DIAGNOSIS — N390 Urinary tract infection, site not specified: Secondary | ICD-10-CM | POA: Diagnosis not present

## 2019-02-24 MED ORDER — ALPRAZOLAM 0.25 MG PO TABS: 0.2500 mg | ORAL_TABLET | Freq: Every evening | ORAL | 2 refills | Status: DC | PRN

## 2019-02-24 MED ORDER — PNEUMOVAX 23 25 MCG/0.5ML IJ INJ: INJECTION | INTRAMUSCULAR | 0 refills | Status: DC

## 2019-02-24 MED ORDER — CIPROFLOXACIN HCL 500 MG PO TABS: 500.0000 mg | ORAL_TABLET | Freq: Two times a day (BID) | ORAL | 0 refills | Status: DC

## 2019-02-24 NOTE — Progress Notes (Signed)
[facility] [address] [place], [place] [address]  Internal MEDICINE  Office Visit Note  Patient Name: Michelle [name]  [id number]  [id number]  Date of Service: 03/05/2019   Pt is here for routine health maintenance examination   Chief Complaint  Patient presents with  . Annual Exam  . Hypothyroidism  . Hypertension  . Quality Metric Gaps    pna vacc 2     The patient is here for health maintenance exam. She states that two weeks ago, she started having pain and burning with urination. She was seen at walk-in clinic. Was treated with 7 days of cipro. She reports improvement in symptoms, however still having some pressure in the bladder. She had routine blood work done in July and these were reviewed with her. She is due to have PneumoVax. She has appointment reminder to have her mammogram scheduled later this month.    Current Medication: Outpatient Encounter Medications as of 02/24/2019  Medication Sig  . ALPRAZolam (XANAX) 0.25 MG tablet Take 1 tablet (0.25 mg total) by mouth at bedtime as needed.  Marland Kitchen amLODipine (NORVASC) 2.5 MG tablet Take 1 tablet (2.5 mg total) by mouth daily.  Marland Kitchen estradiol (ESTRACE) 0.5 MG tablet Take 1 tablet (0.5 mg total) by mouth daily.  . fluticasone (FLONASE) 50 MCG/ACT nasal spray Place 1 spray into both nostrils daily as needed.  Marland Kitchen levothyroxine (SYNTHROID) 88 MCG tablet Take 1 tablet (88 mcg total) by mouth daily before breakfast.  . meloxicam (MOBIC) 15 MG tablet Take 1 tablet by mouth once daily  . Zoster Vaccine Live, PF, (ZOSTAVAX) 32355 UNT/0.65ML injection   . [DISCONTINUED] ALPRAZolam (XANAX) 0.25 MG tablet Take 1 tablet (0.25 mg total) by mouth at bedtime as needed.  . ciprofloxacin (CIPRO) 500 MG tablet Take 1 tablet (500 mg total) by mouth 2 (two) times daily.  . pneumococcal 23 valent vaccine (PNEUMOVAX 23) 25 MCG/0.5ML injection Inject 0.64ml IM once  . [DISCONTINUED] amoxicillin-clavulanate (AUGMENTIN) 875-125 MG tablet  Take 1 tablet by mouth 2 (two) times daily. (Patient not taking: Reported on 02/24/2019)  . [DISCONTINUED] promethazine-codeine (PHENERGAN WITH CODEINE) 6.25-10 MG/5ML syrup Take 5 mLs by mouth every 8 (eight) hours as needed for cough. (Patient not taking: Reported on 02/24/2019)   No facility-administered encounter medications on file as of 02/24/2019.     Surgical History: Past Surgical History:  Procedure Laterality Date  . child birth natural    . diatation      Medical History: Past Medical History:  Diagnosis Date  . Bladder incontinence   . Hypertension   . Hypothyroidism     Family History: Family History  Problem Relation Age of Onset  . Cancer Father   . Hyperlipidemia Father   . Hypertension Father       Review of Systems  Constitutional: Positive for fatigue. Negative for activity change, chills and unexpected weight change.  HENT: Negative for congestion, postnasal drip, rhinorrhea, sneezing and sore throat.   Respiratory: Negative for cough, chest tightness, shortness of breath and wheezing.   Cardiovascular: Negative for chest pain and palpitations.  Gastrointestinal: Negative for abdominal pain, constipation, diarrhea, nausea and vomiting.  Endocrine: Negative for cold intolerance, heat intolerance, polydipsia and polyuria.       Generally well controlled thyroid disease.  Genitourinary: Positive for flank pain, frequency and urgency.  Musculoskeletal: Positive for back pain. Negative for arthralgias, joint swelling and neck pain.  Skin: Negative for rash.  Neurological: Negative for dizziness, tremors, numbness and  headaches.  Hematological: Negative for adenopathy. Does not bruise/bleed easily.  Psychiatric/Behavioral: Negative for behavioral problems (Depression), sleep disturbance and suicidal ideas. The patient is nervous/anxious.     Today's Vitals   02/24/19 0932  BP: (!) 132/94  Pulse: 74  Resp: 16  Temp: (!) 97.4 F (36.3 C)  SpO2: 99%   Weight: 140 lb (63.5 kg)  Height: 5\' 2"  (1.575 m)   Body mass index is 25.61 kg/m.   Physical Exam Vitals signs and nursing note reviewed.  Constitutional:      General: She is not in acute distress.    Appearance: Normal appearance. She is well-developed. She is not diaphoretic.  HENT:     Head: Normocephalic and atraumatic.     Nose: Nose normal.     Mouth/Throat:     Pharynx: No oropharyngeal exudate.  Eyes:     Pupils: Pupils are equal, round, and reactive to light.  Neck:     Musculoskeletal: Normal range of motion and neck supple.     Thyroid: No thyromegaly.     Vascular: No JVD.     Trachea: No tracheal deviation.  Cardiovascular:     Rate and Rhythm: Normal rate and regular rhythm.     Pulses: Normal pulses.     Heart sounds: Normal heart sounds. No murmur. No friction rub. No gallop.   Pulmonary:     Effort: Pulmonary effort is normal. No respiratory distress.     Breath sounds: Normal breath sounds. No wheezing or rales.  Chest:     Chest wall: No tenderness.     Breasts:        Right: Normal. No swelling, bleeding, inverted nipple, mass, nipple discharge, skin change or tenderness.        Left: Normal. No swelling, bleeding, inverted nipple, mass, nipple discharge, skin change or tenderness.  Abdominal:     General: Bowel sounds are normal.     Palpations: Abdomen is soft.     Tenderness: There is no abdominal tenderness.  Musculoskeletal: Normal range of motion.  Lymphadenopathy:     Cervical: No cervical adenopathy.     Upper Body:     Right upper body: No supraclavicular, axillary or pectoral adenopathy.     Left upper body: No supraclavicular, axillary or pectoral adenopathy.  Skin:    General: Skin is warm and dry.  Neurological:     Mental Status: She is alert and oriented to person, place, and time.     Cranial Nerves: No cranial nerve deficit.  Psychiatric:        Behavior: Behavior normal.        Thought Content: Thought content normal.         Judgment: Judgment normal.    Depression screen [facility] 2/[facility] 02/24/2019 11/25/2018 08/22/2018 05/31/2018 02/19/2018  Decreased Interest 0 0 0 0 0  Down, Depressed, Hopeless 0 0 0 0 0  PHQ - 2 Score 0 0 0 0 0    Functional Status Survey: Is the patient deaf or have difficulty hearing?: No Does the patient have difficulty seeing, even when wearing glasses/contacts?: No Does the patient have difficulty concentrating, remembering, or making decisions?: No Does the patient have difficulty walking or climbing stairs?: No Does the patient have difficulty dressing or bathing?: No Does the patient have difficulty doing errands alone such as visiting a doctor's office or shopping?: No  MMSE - Mini Mental State Exam 02/24/2019 02/19/2018  Orientation to time 5 5  Orientation to Place 5  5  Registration 3 3  Attention/ Calculation 5 5  Recall 3 3  Language- name 2 objects 2 2  Language- repeat 1 1  Language- follow 3 step command 3 3  Language- read & follow direction 1 1  Write a sentence 1 1  Copy design 1 1  Total score 30 30    Fall Risk  02/24/2019 11/25/2018 08/22/2018 05/31/2018 02/19/2018  Falls in the past year? 0 0 0 0 No  Number falls in past yr: - - 0 0 -  Injury with Fall? - - 0 0 -      LABS: Recent Results (from the past 2160 hour(s))  CULTURE, URINE COMPREHENSIVE     Status: Abnormal   Collection Time: 02/24/19 10:29 AM   Specimen: Urine   URINE  Result Value Ref Range   Urine Culture, Comprehensive Final report (A)    Organism ID, Bacteria Morganella morganii (A)     Comment: Greater than 100,000 colony forming units per mL   ANTIMICROBIAL SUSCEPTIBILITY Comment     Comment:       ** S = Susceptible; I = Intermediate; R = Resistant **                    P = Positive; N = Negative             MICS are expressed in micrograms per mL    Antibiotic                 RSLT#1    RSLT#2    RSLT#3    RSLT#4 Amoxicillin/Clavulanic Acid    R Ampicillin                     R Cefazolin                       R Cefuroxime                     R Ciprofloxacin                  R Ertapenem                      S Gentamicin                     S Imipenem                       S Levofloxacin                   R Meropenem                      S Nitrofurantoin                 R Piperacillin/Tazobactam        S Tetracycline                   R Tobramycin                     S Trimethoprim/Sulfa             S     Assessment/Plan: 1. Encounter for general adult medical examination with abnormal findings Annual health maintenance exam today.   2. Urinary tract infection with hematuria, site unspecified Start ciprofloxacin  bid for 10  days. Send urine for culture and sensitivity and adjust antibiotics as indicated.  - ciprofloxacin (CIPRO) 500 MG tablet; Take 1 tablet (500 mg total) by mouth 2 (two) times daily.  Dispense: 20 tablet; Refill: 0  3. Essential hypertension Stable. Continue low dose amlodipine as prescribed   4. Generalized anxiety disorder May continue to take alprazolam 0.25mg  at bedtime as needed. New prescription sent to her pharmacy today.  - ALPRAZolam (XANAX) 0.25 MG tablet; Take 1 tablet (0.25 mg total) by mouth at bedtime as needed.  Dispense: 30 tablet; Refill: 2  5. Need for vaccination against Streptococcus pneumoniae using pneumococcal conjugate vaccine 7 Prescription for pneumovax sent to her pharmacyfor administration.  - pneumococcal 23 valent vaccine (PNEUMOVAX 23) 25 MCG/0.5ML injection; Inject 0.51ml IM once  Dispense: 0.5 mL; Refill: 0  6. Dysuria - POCT Urinalysis Dipstick - CULTURE, URINE COMPREHENSIVE  General Counseling: elvia aydin understanding of the findings of todays visit and agrees with plan of treatment. I have discussed any further diagnostic evaluation that may be needed or ordered today. We also reviewed her medications today. she has been encouraged to call the office with any questions or concerns that should arise  related to todays visit.    Counseling:  This patient was seen by Leretha Pol FNP Collaboration with Dr Lavera Guise as a part of collaborative care agreement  Orders Placed This Encounter  Procedures  . CULTURE, URINE COMPREHENSIVE  . POCT Urinalysis Dipstick    Meds ordered this encounter  Medications  . pneumococcal 23 valent vaccine (PNEUMOVAX 23) 25 MCG/0.5ML injection    Sig: Inject 0.7ml IM once    Dispense:  0.5 mL    Refill:  0    Order Specific Question:   Supervising Provider    Answer:   Lavera Guise [[id number]]  . ALPRAZolam (XANAX) 0.25 MG tablet    Sig: Take 1 tablet (0.25 mg total) by mouth at bedtime as needed.    Dispense:  30 tablet    Refill:  2    Order Specific Question:   Supervising Provider    Answer:   Lavera Guise [[id number]]  . ciprofloxacin (CIPRO) 500 MG tablet    Sig: Take 1 tablet (500 mg total) by mouth 2 (two) times daily.    Dispense:  20 tablet    Refill:  0    Order Specific Question:   Supervising Provider    Answer:   Lavera Guise [[id number]]    Time spent: [place], MD  Internal Medicine

## 2019-02-26 NOTE — Progress Notes (Signed)
Waiting on culture results from appropriate treatment.

## 2019-02-27 LAB — CULTURE, URINE COMPREHENSIVE

## 2019-02-28 NOTE — Progress Notes (Signed)
Can you find out what happens to the patient when she takes bactrim. The uti she has is resistant to many antibiotics. The only one I can find that is oral is bactrim.

## 2019-03-04 ENCOUNTER — Other Ambulatory Visit: Payer: Self-pay | Admitting: Nurse Practitioner

## 2019-03-04 ENCOUNTER — Telehealth: Payer: Self-pay

## 2019-03-04 DIAGNOSIS — R319 Hematuria, unspecified: Secondary | ICD-10-CM

## 2019-03-04 DIAGNOSIS — N390 Urinary tract infection, site not specified: Secondary | ICD-10-CM

## 2019-03-04 MED ORDER — DOXYCYCLINE HYCLATE 100 MG PO TABS: 100.0000 mg | ORAL_TABLET | Freq: Two times a day (BID) | ORAL | 0 refills | Status: DC

## 2019-03-04 NOTE — Telephone Encounter (Signed)
-----   Message from Ronnell Freshwater, NP sent at 03/04/2019  1:21 PM EDT ----- changed antibiotic treatment to doxycycline 100mg  bid for 7 days. I would like to see her back after she finishes to repeat urine sample.

## 2019-03-04 NOTE — Telephone Encounter (Signed)
Pt advised we change antibiotic to doxycycline make follow up to after she finished

## 2019-03-04 NOTE — Progress Notes (Signed)
changed antibiotic treatment to doxycycline 100mg bid for 7 days. I would like to see her back after she finishes to repeat urine sample.

## 2019-03-04 NOTE — Progress Notes (Signed)
changed antibiotic treatment to doxycycline 100mg  bid for 7 days. I would like to see her back after she finishes to repeat urine sample.

## 2019-03-05 DIAGNOSIS — Z23 Encounter for immunization: Secondary | ICD-10-CM | POA: Insufficient documentation

## 2019-03-05 DIAGNOSIS — N390 Urinary tract infection, site not specified: Secondary | ICD-10-CM | POA: Insufficient documentation

## 2019-03-05 DIAGNOSIS — R319 Hematuria, unspecified: Secondary | ICD-10-CM | POA: Insufficient documentation

## 2019-03-05 DIAGNOSIS — Z0001 Encounter for general adult medical examination with abnormal findings: Secondary | ICD-10-CM | POA: Insufficient documentation

## 2019-03-05 DIAGNOSIS — R300 Dysuria: Secondary | ICD-10-CM | POA: Insufficient documentation

## 2019-03-05 LAB — POCT URINALYSIS DIPSTICK
Blood, UA: POSITIVE
Glucose, UA: NEGATIVE
Nitrite, UA: NEGATIVE
Protein, UA: NEGATIVE

## 2019-03-20 ENCOUNTER — Other Ambulatory Visit: Payer: Self-pay

## 2019-03-20 ENCOUNTER — Encounter: Payer: Self-pay | Admitting: Nurse Practitioner

## 2019-03-20 ENCOUNTER — Ambulatory Visit (INDEPENDENT_AMBULATORY_CARE_PROVIDER_SITE_OTHER): Payer: PPO | Admitting: Nurse Practitioner

## 2019-03-20 VITALS — BP 141/85 | HR 72 | Temp 97.3°F | Resp 16 | Ht 62.0 in | Wt 140.0 lb

## 2019-03-20 DIAGNOSIS — N390 Urinary tract infection, site not specified: Secondary | ICD-10-CM

## 2019-03-20 DIAGNOSIS — E039 Hypothyroidism, unspecified: Secondary | ICD-10-CM

## 2019-03-20 DIAGNOSIS — R319 Hematuria, unspecified: Secondary | ICD-10-CM | POA: Diagnosis not present

## 2019-03-20 DIAGNOSIS — R300 Dysuria: Secondary | ICD-10-CM

## 2019-03-20 LAB — POCT URINALYSIS DIPSTICK
Bilirubin, UA: NEGATIVE
Glucose, UA: NEGATIVE
Ketones, UA: NEGATIVE
Leukocytes, UA: NEGATIVE
Nitrite, UA: NEGATIVE
Protein, UA: NEGATIVE
Spec Grav, UA: 1.01 (ref 1.010–1.025)
Urobilinogen, UA: 0.2 E.U./dL
pH, UA: 6 (ref 5.0–8.0)

## 2019-03-20 NOTE — Progress Notes (Signed)
[facility] [address] [place], [place] [address]  Internal MEDICINE  Office Visit Note  Patient Name: Michelle [name]  [id number]  [id number]  Date of Service: 04/05/2019  Chief Complaint  Patient presents with  . Urinary Tract Infection    recheck urine    The patient is here for follow up of labs. Was found to have blood and infection in urine at appointment for AWV. She has completed antibiotic treatment. Urine sample continues to be positive for moderate blood. She states that she is feeling well. She denies abdominal pain or flank pain. She also denies pain with urination, frequency, or urgency or urination. She denies nausea, vomiting, or diarrhea.       Current Medication: Outpatient Encounter Medications as of 03/20/2019  Medication Sig  . ALPRAZolam (XANAX) 0.25 MG tablet Take 1 tablet (0.25 mg total) by mouth at bedtime as needed.  Marland Kitchen amLODipine (NORVASC) 2.5 MG tablet Take 1 tablet (2.5 mg total) by mouth daily.  . ciprofloxacin (CIPRO) 500 MG tablet Take 1 tablet (500 mg total) by mouth 2 (two) times daily.  Marland Kitchen doxycycline (VIBRA-TABS) 100 MG tablet Take 1 tablet (100 mg total) by mouth 2 (two) times daily.  Marland Kitchen estradiol (ESTRACE) 0.5 MG tablet Take 1 tablet (0.5 mg total) by mouth daily.  . fluticasone (FLONASE) 50 MCG/ACT nasal spray Place 1 spray into both nostrils daily as needed.  Marland Kitchen levothyroxine (SYNTHROID) 88 MCG tablet Take 1 tablet (88 mcg total) by mouth daily before breakfast.  . meloxicam (MOBIC) 15 MG tablet Take 1 tablet by mouth once daily  . pneumococcal 23 valent vaccine (PNEUMOVAX 23) 25 MCG/0.5ML injection Inject 0.51ml IM once  . Zoster Vaccine Live, PF, (ZOSTAVAX) 54270 UNT/0.65ML injection    No facility-administered encounter medications on file as of 03/20/2019.     Surgical History: Past Surgical History:  Procedure Laterality Date  . child birth natural    . diatation      Medical History: Past Medical History:   Diagnosis Date  . Bladder incontinence   . Hypertension   . Hypothyroidism     Family History: Family History  Problem Relation Age of Onset  . Cancer Father   . Hyperlipidemia Father   . Hypertension Father     Social History   Socioeconomic History  . Marital status: Married    Spouse name: Not on file  . Number of children: Not on file  . Years of education: Not on file  . Highest education level: Not on file  Occupational History  . Not on file  Social Needs  . Financial resource strain: Not on file  . Food insecurity    Worry: Not on file    Inability: Not on file  . Transportation needs    Medical: Not on file    Non-medical: Not on file  Tobacco Use  . Smoking status: Former Games developer  . Smokeless tobacco: Never Used  Substance and Sexual Activity  . Alcohol use: No  . Drug use: No  . Sexual activity: Not on file  Lifestyle  . Physical activity    Days per week: Not on file    Minutes per session: Not on file  . Stress: Not on file  Relationships  . Social Musician on phone: Not on file    Gets together: Not on file    Attends religious service: Not on file    Active member of club or organization: Not on  file    Attends meetings of clubs or organizations: Not on file    Relationship status: Not on file  . Intimate partner violence    Fear of current or ex partner: Not on file    Emotionally abused: Not on file    Physically abused: Not on file    Forced sexual activity: Not on file  Other Topics Concern  . Not on file  Social History Narrative  . Not on file      Review of Systems  Constitutional: Negative for activity change, chills, fatigue and unexpected weight change.  HENT: Negative for congestion, postnasal drip, rhinorrhea, sneezing and sore throat.   Respiratory: Negative for cough, chest tightness, shortness of breath and wheezing.   Cardiovascular: Negative for chest pain and palpitations.  Gastrointestinal: Negative  for abdominal pain, constipation, diarrhea, nausea and vomiting.  Genitourinary: Negative for dysuria, frequency, hematuria and urgency.  Musculoskeletal: Negative for arthralgias, back pain, joint swelling and neck pain.  Skin: Negative for rash.  Neurological: Negative for dizziness, tremors, numbness and headaches.  Hematological: Negative for adenopathy. Does not bruise/bleed easily.  Psychiatric/Behavioral: Negative for behavioral problems (Depression), sleep disturbance and suicidal ideas. The patient is not nervous/anxious.     Today's Vitals   03/20/19 0922  BP: (!) 141/85  Pulse: 72  Resp: 16  Temp: (!) 97.3 F (36.3 C)  SpO2: 98%  Weight: 140 lb (63.5 kg)  Height: 5\' 2"  (1.575 m)   Body mass index is 25.61 kg/m.  Physical Exam Vitals signs and nursing note reviewed.  Constitutional:      Appearance: Normal appearance.  HENT:     Head: Normocephalic.     Nose: Nose normal.  Eyes:     Extraocular Movements: Extraocular movements intact.     Pupils: Pupils are equal, round, and reactive to light.  Neck:     Musculoskeletal: Normal range of motion and neck supple.     Vascular: No carotid bruit.  Cardiovascular:     Rate and Rhythm: Normal rate and regular rhythm.     Heart sounds: Normal heart sounds.  Pulmonary:     Effort: Pulmonary effort is normal.     Breath sounds: Normal breath sounds.  Abdominal:     General: Bowel sounds are normal.     Palpations: Abdomen is soft.     Tenderness: There is no abdominal tenderness.  Genitourinary:    Comments: Urine positive for moderate blood.  Musculoskeletal: Normal range of motion.  Skin:    General: Skin is warm and dry.  Neurological:     Mental Status: She is alert and oriented to person, place, and time.  Psychiatric:        Mood and Affect: Mood normal.        Behavior: Behavior normal.        Thought Content: Thought content normal.        Judgment: Judgment normal.    Assessment/Plan: 1.  Hematuria, unspecified type Urine sample positive for moderate blood. Will ultrasound kidneys and bladder for further evaluation - US Renal; Future - [place], retroperitnl abd,  ltd  2. Urinary tract infection with hematuria, site unspecified U/a showing no evidence of infection. Will monitor.   3. Acquired hypothyroidism Thyroid panel stable. Continue levothyroxine as prescribed.   4. Dysuria - POCT Urinalysis Dipstick - CULTURE, URINE COMPREHENSIVE  General Counseling: Alvia GroveFrances verbalizes understanding of the findings of todays visit and agrees with plan of treatment. I have discussed any further diagnostic  evaluation that may be needed or ordered today. We also reviewed her medications today. she has been encouraged to call the office with any questions or concerns that should arise related to todays visit.  This patient was seen by Leretha Pol FNP Collaboration with Dr Lavera Guise as a part of collaborative care agreement  Orders Placed This Encounter  Procedures  . CULTURE, URINE COMPREHENSIVE  . US Renal  . [place], retroperitnl abd,  ltd  . POCT Urinalysis Dipstick     Time spent: 25 Minutes      Dr Lavera Guise Internal medicine

## 2019-03-23 LAB — CULTURE, URINE COMPREHENSIVE

## 2019-03-23 NOTE — Progress Notes (Signed)
Patient to get u/s kidneys and bladder. Repeat u/a at next visit.

## 2019-04-02 ENCOUNTER — Telehealth: Payer: Self-pay

## 2019-04-02 NOTE — Telephone Encounter (Signed)
Left message advising pt of ultrasound appt. beth °

## 2019-04-04 ENCOUNTER — Telehealth: Payer: Self-pay

## 2019-04-04 ENCOUNTER — Other Ambulatory Visit: Payer: Self-pay

## 2019-04-04 ENCOUNTER — Ambulatory Visit: Payer: PPO

## 2019-04-04 DIAGNOSIS — R319 Hematuria, unspecified: Secondary | ICD-10-CM | POA: Diagnosis not present

## 2019-04-04 NOTE — Telephone Encounter (Signed)
CONFIRMED OV AT THE TIME OF PATIENTS [place]

## 2019-04-05 DIAGNOSIS — R319 Hematuria, unspecified: Secondary | ICD-10-CM | POA: Insufficient documentation

## 2019-04-08 ENCOUNTER — Other Ambulatory Visit: Payer: Self-pay

## 2019-04-08 ENCOUNTER — Ambulatory Visit (INDEPENDENT_AMBULATORY_CARE_PROVIDER_SITE_OTHER): Payer: PPO | Admitting: Nurse Practitioner

## 2019-04-08 ENCOUNTER — Encounter: Payer: Self-pay | Admitting: Nurse Practitioner

## 2019-04-08 VITALS — BP 134/68 | HR 69 | Temp 97.4°F | Resp 16 | Ht 62.0 in | Wt 141.0 lb

## 2019-04-08 DIAGNOSIS — N200 Calculus of kidney: Secondary | ICD-10-CM

## 2019-04-08 DIAGNOSIS — R319 Hematuria, unspecified: Secondary | ICD-10-CM

## 2019-04-08 DIAGNOSIS — J300 Vasomotor rhinitis: Secondary | ICD-10-CM

## 2019-04-08 MED ORDER — FLUTICASONE PROPIONATE 50 MCG/ACT NA SUSP: 1.0000 | Freq: Every day | NASAL | 3 refills | Status: DC | PRN

## 2019-04-08 NOTE — Progress Notes (Signed)
[facility] [place], [place] [address]  Internal MEDICINE  Office Visit Note  Patient Name: Michelle [name]  [id number]  [id number]  Date of Service: 04/20/2019  Chief Complaint  Patient presents with  . Follow-up    ultrasound results    The patient is here for follow up. Had ultrasound of kidneys and bladder due to persistent hematuria. Ultrasound of kidneys shows 12mm nonobstructing stone in left kidney. There is mild hydronephrosis and dilation of proximal right ureter with no definite calculi or obstruction noted.       Current Medication: Outpatient Encounter Medications as of 04/08/2019  Medication Sig  . ALPRAZolam (XANAX) 0.25 MG tablet Take 1 tablet (0.25 mg total) by mouth at bedtime as needed.  Marland Kitchen amLODipine (NORVASC) 2.5 MG tablet Take 1 tablet (2.5 mg total) by mouth daily.  Marland Kitchen estradiol (ESTRACE) 0.5 MG tablet Take 1 tablet (0.5 mg total) by mouth daily.  . fluticasone (FLONASE) 50 MCG/ACT nasal spray Place 1 spray into both nostrils daily as needed.  Marland Kitchen levothyroxine (SYNTHROID) 88 MCG tablet Take 1 tablet (88 mcg total) by mouth daily before breakfast.  . meloxicam (MOBIC) 15 MG tablet Take 1 tablet by mouth once daily  . pneumococcal 23 valent vaccine (PNEUMOVAX 23) 25 MCG/0.5ML injection Inject 0.43ml IM once  . Zoster Vaccine Live, PF, (ZOSTAVAX) 82993 UNT/0.65ML injection   . [DISCONTINUED] fluticasone (FLONASE) 50 MCG/ACT nasal spray Place 1 spray into both nostrils daily as needed.  . [DISCONTINUED] ciprofloxacin (CIPRO) 500 MG tablet Take 1 tablet (500 mg total) by mouth 2 (two) times daily. (Patient not taking: Reported on 04/08/2019)  . [DISCONTINUED] doxycycline (VIBRA-TABS) 100 MG tablet Take 1 tablet (100 mg total) by mouth 2 (two) times daily. (Patient not taking: Reported on 04/08/2019)   No facility-administered encounter medications on file as of 04/08/2019.     Surgical History: Past Surgical History:  Procedure  Laterality Date  . child birth natural    . diatation      Medical History: Past Medical History:  Diagnosis Date  . Bladder incontinence   . Hypertension   . Hypothyroidism     Family History: Family History  Problem Relation Age of Onset  . Cancer Father   . Hyperlipidemia Father   . Hypertension Father     Social History   Socioeconomic History  . Marital status: Married    Spouse name: Not on file  . Number of children: Not on file  . Years of education: Not on file  . Highest education level: Not on file  Occupational History  . Not on file  Social Needs  . Financial resource strain: Not on file  . Food insecurity    Worry: Not on file    Inability: Not on file  . Transportation needs    Medical: Not on file    Non-medical: Not on file  Tobacco Use  . Smoking status: Former Research scientist (life sciences)  . Smokeless tobacco: Never Used  Substance and Sexual Activity  . Alcohol use: No  . Drug use: No  . Sexual activity: Not on file  Lifestyle  . Physical activity    Days per week: Not on file    Minutes per session: Not on file  . Stress: Not on file  Relationships  . Social Herbalist on phone: Not on file    Gets together: Not on file    Attends religious service: Not on file    Active  member of club or organization: Not on file    Attends meetings of clubs or organizations: Not on file    Relationship status: Not on file  . Intimate partner violence    Fear of current or ex partner: Not on file    Emotionally abused: Not on file    Physically abused: Not on file    Forced sexual activity: Not on file  Other Topics Concern  . Not on file  Social History Narrative  . Not on file      Review of Systems  Constitutional: Negative for activity change, chills, fatigue and unexpected weight change.  HENT: Negative for congestion, postnasal drip, rhinorrhea, sneezing and sore throat.   Respiratory: Negative for cough, chest tightness, shortness of breath  and wheezing.   Cardiovascular: Negative for chest pain and palpitations.  Gastrointestinal: Negative for abdominal pain, constipation, diarrhea, nausea and vomiting.  Musculoskeletal: Negative for arthralgias, back pain, joint swelling and neck pain.  Skin: Negative for rash.  Neurological: Negative for dizziness, tremors, numbness and headaches.  Hematological: Negative for adenopathy. Does not bruise/bleed easily.  Psychiatric/Behavioral: Negative for behavioral problems (Depression), sleep disturbance and suicidal ideas. The patient is not nervous/anxious.     Today's Vitals   04/08/19 1615  BP: 134/68  Pulse: 69  Resp: 16  Temp: (!) 97.4 F (36.3 C)  SpO2: 98%  Weight: 141 lb (64 kg)  Height: 5\' 2"  (1.575 m)   Body mass index is 25.79 kg/m.  Physical Exam Vitals signs and nursing note reviewed.  Constitutional:      Appearance: Normal appearance.  HENT:     Head: Normocephalic.     Nose: Nose normal.  Eyes:     Extraocular Movements: Extraocular movements intact.     Pupils: Pupils are equal, round, and reactive to light.  Neck:     Musculoskeletal: Normal range of motion and neck supple.     Vascular: No carotid bruit.  Cardiovascular:     Rate and Rhythm: Normal rate and regular rhythm.     Heart sounds: Normal heart sounds.  Pulmonary:     Effort: Pulmonary effort is normal.     Breath sounds: Normal breath sounds.  Abdominal:     General: Bowel sounds are normal.     Palpations: Abdomen is soft.     Tenderness: There is no abdominal tenderness.  Musculoskeletal: Normal range of motion.  Skin:    General: Skin is warm and dry.  Neurological:     Mental Status: She is alert and oriented to person, place, and time.  Psychiatric:        Mood and Affect: Mood normal.        Behavior: Behavior normal.        Thought Content: Thought content normal.        Judgment: Judgment normal.    Assessment/Plan: 1. Hematuria, unspecified type Ultrasound of kidneys  shows 24mm nonobstructing stone in left kidney. There is mild hydronephrosis and dilation of proximal right ureter with no definite calculi or obstruction noted.   2. Renal calculi Refer to urology for further evaluation - Ambulatory referral to Urology  3. Vasomotor rhinitis - fluticasone (FLONASE) 50 MCG/ACT nasal spray; Place 1 spray into both nostrils daily as needed.  Dispense: 16 g; Refill: 3  General Counseling: Michelle [name] verbalizes understanding of the findings of todays visit and agrees with plan of treatment. I have discussed any further diagnostic evaluation that may be needed or ordered today. We also  reviewed her medications today. she has been encouraged to call the office with any questions or concerns that should arise related to todays visit.  This patient was seen by Vincent GrosHeather Daunte Oestreich FNP Collaboration with Dr Lyndon CodeFozia M Khan as a part of collaborative care agreement  Orders Placed This Encounter  Procedures  . Ambulatory referral to Urology    Meds ordered this encounter  Medications  . fluticasone (FLONASE) 50 MCG/ACT nasal spray    Sig: Place 1 spray into both nostrils daily as needed.    Dispense:  16 g    Refill:  3    Order Specific Question:   Supervising Provider    Answer:   Lyndon CodeKHAN, FOZIA M [[id number]]    Time spent: 3025 Minutes      Dr Lyndon CodeFozia M Khan Internal medicine

## 2019-04-09 NOTE — Progress Notes (Signed)
Discussed with patient at visit 04/08/2019. Referral made to urology for further evaluation.

## 2019-04-16 ENCOUNTER — Telehealth: Payer: Self-pay

## 2019-04-16 ENCOUNTER — Other Ambulatory Visit: Payer: Self-pay | Admitting: Nurse Practitioner

## 2019-04-16 DIAGNOSIS — J019 Acute sinusitis, unspecified: Secondary | ICD-10-CM

## 2019-04-16 MED ORDER — AZITHROMYCIN 250 MG PO TABS: ORAL_TABLET | ORAL | 0 refills | Status: DC

## 2019-04-16 NOTE — Telephone Encounter (Signed)
Sent z-pack for acute sinusitis. Take by mouth as directed for 5 days. Sent to [facility] on [address].

## 2019-04-16 NOTE — Telephone Encounter (Signed)
Pt advised we send zpak to [facility]

## 2019-04-16 NOTE — Progress Notes (Signed)
Sent z-pack for acute sinusitis. Take by mouth as directed for 5 days. Sent to [facility] on [address].

## 2019-04-20 DIAGNOSIS — N200 Calculus of kidney: Secondary | ICD-10-CM | POA: Insufficient documentation

## 2019-04-20 DIAGNOSIS — J300 Vasomotor rhinitis: Secondary | ICD-10-CM | POA: Insufficient documentation

## 2019-05-07 ENCOUNTER — Ambulatory Visit: Payer: Self-pay | Admitting: Urology

## 2019-05-20 ENCOUNTER — Other Ambulatory Visit: Payer: Self-pay

## 2019-05-20 DIAGNOSIS — E039 Hypothyroidism, unspecified: Secondary | ICD-10-CM

## 2019-05-20 MED ORDER — LEVOTHYROXINE SODIUM 88 MCG PO TABS: 88.0000 ug | ORAL_TABLET | Freq: Every day | ORAL | 3 refills | Status: DC

## 2019-05-26 ENCOUNTER — Other Ambulatory Visit: Payer: Self-pay

## 2019-05-26 DIAGNOSIS — R300 Dysuria: Secondary | ICD-10-CM

## 2019-05-26 DIAGNOSIS — I10 Essential (primary) hypertension: Secondary | ICD-10-CM

## 2019-05-26 DIAGNOSIS — E039 Hypothyroidism, unspecified: Secondary | ICD-10-CM

## 2019-05-26 DIAGNOSIS — M1990 Unspecified osteoarthritis, unspecified site: Secondary | ICD-10-CM

## 2019-05-26 DIAGNOSIS — Z0001 Encounter for general adult medical examination with abnormal findings: Secondary | ICD-10-CM

## 2019-05-26 DIAGNOSIS — Z23 Encounter for immunization: Secondary | ICD-10-CM

## 2019-05-26 DIAGNOSIS — F411 Generalized anxiety disorder: Secondary | ICD-10-CM

## 2019-05-26 MED ORDER — MELOXICAM 15 MG PO TABS: 15.0000 mg | ORAL_TABLET | Freq: Every day | ORAL | 0 refills | Status: DC

## 2019-05-29 ENCOUNTER — Telehealth: Payer: Self-pay

## 2019-05-29 NOTE — Telephone Encounter (Signed)
CONFIRMED AND SCREENED FOR 06-03-19 OV. °

## 2019-06-03 ENCOUNTER — Ambulatory Visit (INDEPENDENT_AMBULATORY_CARE_PROVIDER_SITE_OTHER): Payer: PPO | Admitting: Nurse Practitioner

## 2019-06-03 ENCOUNTER — Encounter: Payer: Self-pay | Admitting: Nurse Practitioner

## 2019-06-03 ENCOUNTER — Other Ambulatory Visit: Payer: Self-pay

## 2019-06-03 VITALS — BP 147/85 | HR 71 | Temp 97.3°F | Resp 16 | Ht 62.0 in | Wt 141.0 lb

## 2019-06-03 DIAGNOSIS — N200 Calculus of kidney: Secondary | ICD-10-CM

## 2019-06-03 DIAGNOSIS — I10 Essential (primary) hypertension: Secondary | ICD-10-CM | POA: Diagnosis not present

## 2019-06-03 DIAGNOSIS — F411 Generalized anxiety disorder: Secondary | ICD-10-CM | POA: Diagnosis not present

## 2019-06-03 DIAGNOSIS — E039 Hypothyroidism, unspecified: Secondary | ICD-10-CM | POA: Diagnosis not present

## 2019-06-03 MED ORDER — ALPRAZOLAM 0.25 MG PO TABS: 0.2500 mg | ORAL_TABLET | Freq: Every evening | ORAL | 3 refills | Status: DC | PRN

## 2019-06-03 NOTE — Progress Notes (Signed)
[facility] [place],  [id number]  Internal MEDICINE  Office Visit Note  Patient Name: Michelle [name]  [id number]  [id number]  Date of Service: 06/03/2019  Chief Complaint  Patient presents with  . Hypertension  . Hypothyroidism    The patient is here for routine follow up. She states that she is doing well. Was diagnosed and treated for COVID 19 over the Thanksgiving holiday. She states she has recovered fully. Has no new concerns or complaints. Was to see urologist. She does have 81mm stone in left kidney with mild hydronephrosis and ureteral dilation of the right kidney, without evidence of stone/obstruction. She had to cancel this appointment due to COVID 19, but will be rescheduling this soon. She denies chest pain, chest pressure, or shortness of breath. She does take alprazolam at night to help reduce anxiety and to help her sleep. She is due for a refill of this today       Current Medication: Outpatient Encounter Medications as of 06/03/2019  Medication Sig  . ALPRAZolam (XANAX) 0.25 MG tablet Take 1 tablet (0.25 mg total) by mouth at bedtime as needed.  Marland Kitchen amLODipine (NORVASC) 2.5 MG tablet Take 1 tablet (2.5 mg total) by mouth daily.  Marland Kitchen estradiol (ESTRACE) 0.5 MG tablet Take 1 tablet (0.5 mg total) by mouth daily.  . fluticasone (FLONASE) 50 MCG/ACT nasal spray Place 1 spray into both nostrils daily as needed.  Marland Kitchen levothyroxine (SYNTHROID) 88 MCG tablet Take 1 tablet (88 mcg total) by mouth daily before breakfast.  . meloxicam (MOBIC) 15 MG tablet Take 1 tablet (15 mg total) by mouth daily.  . pneumococcal 23 valent vaccine (PNEUMOVAX 23) 25 MCG/0.5ML injection Inject 0.68ml IM once  . Zoster Vaccine Live, PF, (ZOSTAVAX) 24235 UNT/0.65ML injection   . [DISCONTINUED] ALPRAZolam (XANAX) 0.25 MG tablet Take 1 tablet (0.25 mg total) by mouth at bedtime as needed.  . [DISCONTINUED] azithromycin (ZITHROMAX) 250 MG tablet z-pack - take as directed for  5 days for sinusitis   No facility-administered encounter medications on file as of 06/03/2019.    Surgical History: Past Surgical History:  Procedure Laterality Date  . child birth natural    . diatation      Medical History: Past Medical History:  Diagnosis Date  . Bladder incontinence   . Hypertension   . Hypothyroidism     Family History: Family History  Problem Relation Age of Onset  . Cancer Father   . Hyperlipidemia Father   . Hypertension Father     Social History   Socioeconomic History  . Marital status: Married    Spouse name: Not on file  . Number of children: Not on file  . Years of education: Not on file  . Highest education level: Not on file  Occupational History  . Not on file  Tobacco Use  . Smoking status: Former Research scientist (life sciences)  . Smokeless tobacco: Never Used  Substance and Sexual Activity  . Alcohol use: No  . Drug use: No  . Sexual activity: Not on file  Other Topics Concern  . Not on file  Social History Narrative  . Not on file   Social Determinants of Health   Financial Resource Strain:   . Difficulty of Paying Living Expenses: Not on file  Food Insecurity:   . Worried About Charity fundraiser in the Last Year: Not on file  . Ran Out of Food in the Last Year: Not on file  Transportation Needs:   .  Lack of Transportation (Medical): Not on file  . Lack of Transportation (Non-Medical): Not on file  Physical Activity:   . Days of Exercise per Week: Not on file  . Minutes of Exercise per Session: Not on file  Stress:   . Feeling of Stress : Not on file  Social Connections:   . Frequency of Communication with Friends and Family: Not on file  . Frequency of Social Gatherings with Friends and Family: Not on file  . Attends Religious Services: Not on file  . Active Member of Clubs or Organizations: Not on file  . Attends Banker Meetings: Not on file  . Marital Status: Not on file  Intimate Partner Violence:   . Fear of  Current or Ex-Partner: Not on file  . Emotionally Abused: Not on file  . Physically Abused: Not on file  . Sexually Abused: Not on file      Review of Systems  Constitutional: Negative for activity change, chills, fatigue and unexpected weight change.  HENT: Positive for congestion. Negative for postnasal drip, rhinorrhea, sneezing and sore throat.   Respiratory: Negative for cough, chest tightness, shortness of breath and wheezing.   Cardiovascular: Negative for chest pain and palpitations.  Gastrointestinal: Negative for abdominal pain, constipation, diarrhea, nausea and vomiting.  Endocrine: Negative for cold intolerance, heat intolerance, polydipsia and polyuria.  Musculoskeletal: Negative for arthralgias, back pain, joint swelling and neck pain.  Skin: Negative for rash.  Allergic/Immunologic: Negative for environmental allergies.  Neurological: Negative for dizziness, tremors, numbness and headaches.  Hematological: Negative for adenopathy. Does not bruise/bleed easily.  Psychiatric/Behavioral: Positive for sleep disturbance. Negative for behavioral problems (Depression) and suicidal ideas. The patient is nervous/anxious.     Today's Vitals   06/03/19 0944  BP: (!) 147/85  Pulse: 71  Resp: 16  Temp: (!) 97.3 F (36.3 C)  SpO2: 97%  Weight: 141 lb (64 kg)  Height: 5\' 2"  (1.575 m)   Body mass index is 25.79 kg/m.  Physical Exam Vitals and nursing note reviewed.  Constitutional:      General: She is not in acute distress.    Appearance: Normal appearance. She is well-developed. She is not diaphoretic.  HENT:     Head: Normocephalic and atraumatic.     Mouth/Throat:     Pharynx: No oropharyngeal exudate.  Eyes:     Pupils: Pupils are equal, round, and reactive to light.  Neck:     Thyroid: No thyromegaly.     Vascular: No JVD.     Trachea: No tracheal deviation.  Cardiovascular:     Rate and Rhythm: Normal rate and regular rhythm.     Heart sounds: Normal heart  sounds. No murmur. No friction rub. No gallop.   Pulmonary:     Effort: Pulmonary effort is normal. No respiratory distress.     Breath sounds: Normal breath sounds. No wheezing or rales.  Chest:     Chest wall: No tenderness.  Abdominal:     Palpations: Abdomen is soft.  Musculoskeletal:        General: Normal range of motion.     Cervical back: Normal range of motion and neck supple.  Lymphadenopathy:     Cervical: No cervical adenopathy.  Skin:    General: Skin is warm and dry.  Neurological:     Mental Status: She is alert and oriented to person, place, and time.     Cranial Nerves: No cranial nerve deficit.  Psychiatric:  Mood and Affect: Mood normal.        Behavior: Behavior normal.        Thought Content: Thought content normal.        Judgment: Judgment normal.    Assessment/Plan: 1. Essential hypertension Stable. Continue bp medication as prescribed   2. Renal calculi Patient to make new appointment with urologist for further evaluation and treatment   3. Acquired hypothyroidism Continue levothyroxine as prescribed   4. Generalized anxiety disorder May take alprazolam 0.25mg  at bedtime as needed for anxiety/insomnia. New prescription sent to her pharmacy today.  - ALPRAZolam (XANAX) 0.25 MG tablet; Take 1 tablet (0.25 mg total) by mouth at bedtime as needed.  Dispense: 30 tablet; Refill: 3  General Counseling: esmeralda malay understanding of the findings of todays visit and agrees with plan of treatment. I have discussed any further diagnostic evaluation that may be needed or ordered today. We also reviewed her medications today. she has been encouraged to call the office with any questions or concerns that should arise related to todays visit.   This patient was seen by Vincent Gros FNP Collaboration with Dr [name] as a part of collaborative care agreement  Meds ordered this encounter  Medications  . ALPRAZolam (XANAX) 0.25 MG tablet    Sig:  Take 1 tablet (0.25 mg total) by mouth at bedtime as needed.    Dispense:  30 tablet    Refill:  3    Order Specific Question:   Supervising Provider    Answer:   [name] [[id number]]    Total time spent: 20 Minutes  Time spent includes review of chart, medications, test results, and follow up plan with the patient.      Dr [name] Internal medicine

## 2019-07-31 ENCOUNTER — Other Ambulatory Visit: Payer: Self-pay

## 2019-07-31 DIAGNOSIS — E039 Hypothyroidism, unspecified: Secondary | ICD-10-CM

## 2019-07-31 DIAGNOSIS — F411 Generalized anxiety disorder: Secondary | ICD-10-CM

## 2019-07-31 DIAGNOSIS — R300 Dysuria: Secondary | ICD-10-CM

## 2019-07-31 DIAGNOSIS — I10 Essential (primary) hypertension: Secondary | ICD-10-CM

## 2019-07-31 DIAGNOSIS — Z0001 Encounter for general adult medical examination with abnormal findings: Secondary | ICD-10-CM

## 2019-07-31 DIAGNOSIS — Z23 Encounter for immunization: Secondary | ICD-10-CM

## 2019-07-31 DIAGNOSIS — M1990 Unspecified osteoarthritis, unspecified site: Secondary | ICD-10-CM

## 2019-07-31 MED ORDER — MELOXICAM 15 MG PO TABS: 15.0000 mg | ORAL_TABLET | Freq: Every day | ORAL | 0 refills | Status: DC

## 2019-08-01 ENCOUNTER — Other Ambulatory Visit: Payer: Self-pay

## 2019-08-01 DIAGNOSIS — F411 Generalized anxiety disorder: Secondary | ICD-10-CM

## 2019-08-01 DIAGNOSIS — I10 Essential (primary) hypertension: Secondary | ICD-10-CM

## 2019-08-01 DIAGNOSIS — R300 Dysuria: Secondary | ICD-10-CM

## 2019-08-01 DIAGNOSIS — Z0001 Encounter for general adult medical examination with abnormal findings: Secondary | ICD-10-CM

## 2019-08-01 DIAGNOSIS — E039 Hypothyroidism, unspecified: Secondary | ICD-10-CM

## 2019-08-01 DIAGNOSIS — M1990 Unspecified osteoarthritis, unspecified site: Secondary | ICD-10-CM

## 2019-08-01 DIAGNOSIS — Z23 Encounter for immunization: Secondary | ICD-10-CM

## 2019-08-01 MED ORDER — LEVOTHYROXINE SODIUM 88 MCG PO TABS: 88.0000 ug | ORAL_TABLET | Freq: Every day | ORAL | 3 refills | Status: DC

## 2019-08-01 MED ORDER — MELOXICAM 15 MG PO TABS: 15.0000 mg | ORAL_TABLET | Freq: Every day | ORAL | 0 refills | Status: DC

## 2019-09-30 ENCOUNTER — Telehealth: Payer: Self-pay

## 2019-09-30 NOTE — Telephone Encounter (Signed)
Lmom to confirm and screen for 10-02-19 ov. 

## 2019-10-02 ENCOUNTER — Ambulatory Visit (INDEPENDENT_AMBULATORY_CARE_PROVIDER_SITE_OTHER): Payer: PPO | Admitting: Nurse Practitioner

## 2019-10-02 ENCOUNTER — Other Ambulatory Visit: Payer: Self-pay

## 2019-10-02 ENCOUNTER — Encounter: Payer: Self-pay | Admitting: Nurse Practitioner

## 2019-10-02 VITALS — BP 165/80 | HR 73 | Temp 97.2°F | Resp 16 | Ht 62.0 in | Wt 144.0 lb

## 2019-10-02 DIAGNOSIS — Z79899 Other long term (current) drug therapy: Secondary | ICD-10-CM | POA: Diagnosis not present

## 2019-10-02 DIAGNOSIS — G8929 Other chronic pain: Secondary | ICD-10-CM

## 2019-10-02 DIAGNOSIS — M5441 Lumbago with sciatica, right side: Secondary | ICD-10-CM | POA: Diagnosis not present

## 2019-10-02 DIAGNOSIS — I10 Essential (primary) hypertension: Secondary | ICD-10-CM | POA: Diagnosis not present

## 2019-10-02 DIAGNOSIS — F411 Generalized anxiety disorder: Secondary | ICD-10-CM

## 2019-10-02 LAB — POCT URINE DRUG SCREEN
POC Amphetamine UR: NOT DETECTED
POC BENZODIAZEPINES UR: NOT DETECTED
POC Barbiturate UR: NOT DETECTED
POC Cocaine UR: NOT DETECTED
POC Ecstasy UR: NOT DETECTED
POC Marijuana UR: NOT DETECTED
POC Methadone UR: NOT DETECTED
POC Methamphetamine UR: NOT DETECTED
POC Opiate Ur: NOT DETECTED
POC Oxycodone UR: NOT DETECTED
POC PHENCYCLIDINE UR: NOT DETECTED
POC TRICYCLICS UR: NOT DETECTED

## 2019-10-02 MED ORDER — IBUPROFEN 800 MG PO TABS: 800.0000 mg | ORAL_TABLET | Freq: Two times a day (BID) | ORAL | 2 refills | Status: DC | PRN

## 2019-10-02 MED ORDER — AMLODIPINE BESYLATE 5 MG PO TABS: 5.0000 mg | ORAL_TABLET | Freq: Every day | ORAL | 3 refills | Status: DC

## 2019-10-02 MED ORDER — ALPRAZOLAM 0.25 MG PO TABS: 0.2500 mg | ORAL_TABLET | Freq: Every evening | ORAL | 3 refills | Status: DC | PRN

## 2019-10-02 NOTE — Progress Notes (Signed)
[facility] [address] [place], [place] [address]  Internal MEDICINE  Office Visit Note  Patient Name: Michelle [name]  [id number]  [id number]  Date of Service: 10/08/2019  Chief Complaint  Patient presents with  . Hypertension  . Hypothyroidism    The patient is here for routine follow up. Blood pressure is elevated today. States that the last few days she has been feeling "not good."  Today, she states she is feeling much better. Does have some lower back pain in the right side. This radiates into the right hip and right leg. She states that taking Ibuprofen 800mg  helps more than meloxicam she is currently prescribed.  She has history of renal calculi on the left side with hydronephrosis on the right side. Had been referred to urology In the past. Had to cancel initial appointment, as she had developed COVID 19. She has not rescheduled this appointment yet, as this has not been much of issue for her recently.  She has had both Moderna COVID 19 vaccines. These are now documented in her immunization record.       Current Medication: Outpatient Encounter Medications as of 10/02/2019  Medication Sig Note  . ALPRAZolam (XANAX) 0.25 MG tablet Take 1 tablet (0.25 mg total) by mouth at bedtime as needed.   10/04/2019 amLODipine (NORVASC) 5 MG tablet Take 1 tablet (5 mg total) by mouth daily.   Marland Kitchen estradiol (ESTRACE) 0.5 MG tablet Take 1 tablet (0.5 mg total) by mouth daily.   . fluticasone (FLONASE) 50 MCG/ACT nasal spray Place 1 spray into both nostrils daily as needed.   Marland Kitchen levothyroxine (SYNTHROID) 88 MCG tablet Take 1 tablet (88 mcg total) by mouth daily before breakfast.   . pneumococcal 23 valent vaccine (PNEUMOVAX 23) 25 MCG/0.5ML injection Inject 0.42ml IM once   . Zoster Vaccine Live, PF, (ZOSTAVAX) 4m UNT/0.65ML injection    . [DISCONTINUED] ALPRAZolam (XANAX) 0.25 MG tablet Take 1 tablet (0.25 mg total) by mouth at bedtime as needed.   . [DISCONTINUED] amLODipine  (NORVASC) 2.5 MG tablet Take 1 tablet (2.5 mg total) by mouth daily.   . [DISCONTINUED] meloxicam (MOBIC) 15 MG tablet Take 1 tablet (15 mg total) by mouth daily. 10/02/2019: change to ibuprofen 800mg  bid prn  . ibuprofen (ADVIL) 800 MG tablet Take 1 tablet (800 mg total) by mouth 2 (two) times daily as needed.    No facility-administered encounter medications on file as of 10/02/2019.    Surgical History: Past Surgical History:  Procedure Laterality Date  . child birth natural    . diatation      Medical History: Past Medical History:  Diagnosis Date  . Bladder incontinence   . Hypertension   . Hypothyroidism     Family History: Family History  Problem Relation Age of Onset  . Cancer Father   . Hyperlipidemia Father   . Hypertension Father     Social History   Socioeconomic History  . Marital status: Married    Spouse name: Not on file  . Number of children: Not on file  . Years of education: Not on file  . Highest education level: Not on file  Occupational History  . Not on file  Tobacco Use  . Smoking status: Former Smoker    Types: Cigarettes  . Smokeless tobacco: Never Used  Substance and Sexual Activity  . Alcohol use: No  . Drug use: No  . Sexual activity: Not on file  Other Topics Concern  . Not on  file  Social History Narrative  . Not on file   Social Determinants of Health   Financial Resource Strain:   . Difficulty of Paying Living Expenses:   Food Insecurity:   . Worried About Charity fundraiser in the Last Year:   . Arboriculturist in the Last Year:   Transportation Needs:   . Film/video editor (Medical):   Marland Kitchen Lack of Transportation (Non-Medical):   Physical Activity:   . Days of Exercise per Week:   . Minutes of Exercise per Session:   Stress:   . Feeling of Stress :   Social Connections:   . Frequency of Communication with Friends and Family:   . Frequency of Social Gatherings with Friends and Family:   . Attends Religious  Services:   . Active Member of Clubs or Organizations:   . Attends Archivist Meetings:   Marland Kitchen Marital Status:   Intimate Partner Violence:   . Fear of Current or Ex-Partner:   . Emotionally Abused:   Marland Kitchen Physically Abused:   . Sexually Abused:       Review of Systems  Constitutional: Negative for activity change, chills, fatigue and unexpected weight change.  HENT: Negative for congestion, postnasal drip, rhinorrhea, sneezing and sore throat.   Respiratory: Negative for cough, chest tightness, shortness of breath and wheezing.   Cardiovascular: Negative for chest pain and palpitations.       Elevated blood pressure   Gastrointestinal: Negative for abdominal pain, constipation, diarrhea, nausea and vomiting.  Endocrine: Negative for cold intolerance, heat intolerance, polydipsia and polyuria.  Musculoskeletal: Negative for arthralgias, back pain, joint swelling and neck pain.  Skin: Negative for rash.  Allergic/Immunologic: Negative for environmental allergies.  Neurological: Negative for dizziness, tremors, numbness and headaches.  Hematological: Negative for adenopathy. Does not bruise/bleed easily.  Psychiatric/Behavioral: Positive for sleep disturbance. Negative for behavioral problems (Depression) and suicidal ideas. The patient is nervous/anxious.    Today's Vitals   10/02/19 0927  BP: (!) 165/80  Pulse: 73  Resp: 16  Temp: (!) 97.2 F (36.2 C)  SpO2: 98%  Weight: 144 lb (65.3 kg)  Height: 5\' 2"  (1.575 m)   Body mass index is 26.34 kg/m.  Physical Exam Vitals and nursing note reviewed.  Constitutional:      General: She is not in acute distress.    Appearance: Normal appearance. She is well-developed. She is not diaphoretic.  HENT:     Head: Normocephalic and atraumatic.     Mouth/Throat:     Pharynx: No oropharyngeal exudate.  Eyes:     Pupils: Pupils are equal, round, and reactive to light.  Neck:     Thyroid: No thyromegaly.     Vascular: No JVD.      Trachea: No tracheal deviation.  Cardiovascular:     Rate and Rhythm: Normal rate and regular rhythm.     Heart sounds: Normal heart sounds. No murmur. No friction rub. No gallop.   Pulmonary:     Effort: Pulmonary effort is normal. No respiratory distress.     Breath sounds: Normal breath sounds. No wheezing or rales.  Chest:     Chest wall: No tenderness.  Abdominal:     Palpations: Abdomen is soft.  Musculoskeletal:        General: Normal range of motion.     Cervical back: Normal range of motion and neck supple.  Lymphadenopathy:     Cervical: No cervical adenopathy.  Skin:  General: Skin is warm and dry.  Neurological:     Mental Status: She is alert and oriented to person, place, and time.     Cranial Nerves: No cranial nerve deficit.  Psychiatric:        Mood and Affect: Mood normal.        Behavior: Behavior normal.        Thought Content: Thought content normal.        Judgment: Judgment normal.    Assessment/Plan:  1. Essential hypertension Increase amlodipine to 5mg  daily. Encouraged her to monitor her blood pressure at home. Advised her to reduce salt and increase water in her diet  - amLODipine (NORVASC) 5 MG tablet; Take 1 tablet (5 mg total) by mouth daily.  Dispense: 30 tablet; Refill: 3  2. Chronic right-sided low back pain with right-sided sciatica D/c meloxicam. Add ibuprofen 800mg  twice daily as needed for pain. Recommend use of heating pad on the back as needed to reduce pain and inflammation.  - ibuprofen (ADVIL) 800 MG tablet; Take 1 tablet (800 mg total) by mouth 2 (two) times daily as needed.  Dispense: 45 tablet; Refill: 2  3. Generalized anxiety disorder May take alprazolam 0.25mg  at bedtime as needed for acute anxiety. New prescription sent to her pharmacy.  - ALPRAZolam (XANAX) 0.25 MG tablet; Take 1 tablet (0.25 mg total) by mouth at bedtime as needed.  Dispense: 30 tablet; Refill: 3  4. Encounter for long-term (current) use of  medications - POCT Urine Drug Screen negative for all controlled substances.    General Counseling: Michelle [name] understanding of the findings of todays visit and agrees with plan of treatment. I have discussed any further diagnostic evaluation that may be needed or ordered today. We also reviewed her medications today. she has been encouraged to call the office with any questions or concerns that should arise related to todays visit.  Hypertension Counseling:   The following hypertensive lifestyle modification were recommended and discussed:  1. Limiting alcohol intake to less than 1 oz/day of ethanol:(24 oz of beer or 8 oz of wine or 2 oz of 100-proof whiskey). 2. Take baby ASA 81 mg daily. 3. Importance of regular aerobic exercise and losing weight. 4. Reduce dietary saturated fat and cholesterol intake for overall cardiovascular health. 5. Maintaining adequate dietary potassium, calcium, and magnesium intake. 6. Regular monitoring of the blood pressure. 7. Reduce sodium intake to less than 100 mmol/day (less than 2.3 gm of sodium or less than 6 gm of sodium choride)   This patient was seen by FNP Collaboration with Dr Alvia Grove as a part of collaborative care agreement  Orders Placed This Encounter  Procedures  . POCT Urine Drug Screen    Meds ordered this encounter  Medications  . amLODipine (NORVASC) 5 MG tablet    Sig: Take 1 tablet (5 mg total) by mouth daily.    Dispense:  30 tablet    Refill:  3    Order Specific Question:   Supervising Provider    Answer:   Vincent Gros [[id number]]  . ALPRAZolam (XANAX) 0.25 MG tablet    Sig: Take 1 tablet (0.25 mg total) by mouth at bedtime as needed.    Dispense:  30 tablet    Refill:  3    Order Specific Question:   Supervising Provider    Answer:   [name] [[id number]]  . ibuprofen (ADVIL) 800 MG tablet    Sig: Take 1 tablet (  800 mg total) by mouth 2 (two) times daily as needed.    Dispense:  45 tablet     Refill:  2    Order Specific Question:   Supervising Provider    Answer:   [name] [[id number]]    Total time spent: 30 Minutes   Time spent includes review of chart, medications, test results, and follow up plan with the patient.      Dr [name] Internal medicine

## 2019-10-08 DIAGNOSIS — G8929 Other chronic pain: Secondary | ICD-10-CM | POA: Insufficient documentation

## 2019-10-08 DIAGNOSIS — Z79899 Other long term (current) drug therapy: Secondary | ICD-10-CM | POA: Insufficient documentation

## 2019-10-24 ENCOUNTER — Inpatient Hospital Stay
Admission: EM | Admit: 2019-10-24 | Discharge: 2019-10-26 | DRG: 690 | Disposition: A | Discharge status: Home / Self Care | Payer: PPO | Attending: Internal Medicine | Admitting: Internal Medicine

## 2019-10-24 ENCOUNTER — Emergency Department: Payer: PPO

## 2019-10-24 ENCOUNTER — Other Ambulatory Visit: Payer: Self-pay

## 2019-10-24 DIAGNOSIS — Z87891 Personal history of nicotine dependence: Secondary | ICD-10-CM | POA: Diagnosis not present

## 2019-10-24 DIAGNOSIS — Z79890 Hormone replacement therapy (postmenopausal): Secondary | ICD-10-CM | POA: Diagnosis not present

## 2019-10-24 DIAGNOSIS — E039 Hypothyroidism, unspecified: Secondary | ICD-10-CM

## 2019-10-24 DIAGNOSIS — Z8249 Family history of ischemic heart disease and other diseases of the circulatory system: Secondary | ICD-10-CM | POA: Diagnosis not present

## 2019-10-24 DIAGNOSIS — N10 Acute tubulo-interstitial nephritis: Secondary | ICD-10-CM | POA: Diagnosis present

## 2019-10-24 DIAGNOSIS — R109 Unspecified abdominal pain: Secondary | ICD-10-CM | POA: Diagnosis not present

## 2019-10-24 DIAGNOSIS — Z882 Allergy status to sulfonamides status: Secondary | ICD-10-CM | POA: Diagnosis not present

## 2019-10-24 DIAGNOSIS — Z87440 Personal history of urinary (tract) infections: Secondary | ICD-10-CM | POA: Diagnosis not present

## 2019-10-24 DIAGNOSIS — N12 Tubulo-interstitial nephritis, not specified as acute or chronic: Secondary | ICD-10-CM | POA: Diagnosis present

## 2019-10-24 DIAGNOSIS — Z20822 Contact with and (suspected) exposure to covid-19: Secondary | ICD-10-CM | POA: Diagnosis not present

## 2019-10-24 DIAGNOSIS — N136 Pyonephrosis: Secondary | ICD-10-CM | POA: Diagnosis not present

## 2019-10-24 DIAGNOSIS — Z79899 Other long term (current) drug therapy: Secondary | ICD-10-CM | POA: Diagnosis not present

## 2019-10-24 DIAGNOSIS — Z87442 Personal history of urinary calculi: Secondary | ICD-10-CM | POA: Diagnosis not present

## 2019-10-24 DIAGNOSIS — I10 Essential (primary) hypertension: Secondary | ICD-10-CM | POA: Diagnosis present

## 2019-10-24 DIAGNOSIS — N8110 Cystocele, unspecified: Secondary | ICD-10-CM | POA: Diagnosis present

## 2019-10-24 DIAGNOSIS — F411 Generalized anxiety disorder: Secondary | ICD-10-CM | POA: Diagnosis present

## 2019-10-24 DIAGNOSIS — E876 Hypokalemia: Secondary | ICD-10-CM | POA: Diagnosis not present

## 2019-10-24 LAB — CBC
HCT: 42.2 % (ref 36.0–46.0)
Hemoglobin: 14.5 g/dL (ref 12.0–15.0)
MCH: 28.9 pg (ref 26.0–34.0)
MCHC: 34.4 g/dL (ref 30.0–36.0)
MCV: 84.2 fL (ref 80.0–100.0)
Platelets: 191 10*3/uL (ref 150–400)
RBC: 5.01 MIL/uL (ref 3.87–5.11)
RDW: 14.2 % (ref 11.5–15.5)
WBC: 13.6 10*3/uL — ABNORMAL HIGH (ref 4.0–10.5)
nRBC: 0 % (ref 0.0–0.2)

## 2019-10-24 LAB — URINALYSIS, COMPLETE (UACMP) WITH MICROSCOPIC
Bilirubin Urine: NEGATIVE
Glucose, UA: NEGATIVE mg/dL
Ketones, ur: NEGATIVE mg/dL
Nitrite: POSITIVE — AB
Protein, ur: 100 mg/dL — AB
Specific Gravity, Urine: 1.012 (ref 1.005–1.030)
WBC, UA: >50 WBC/hpf — ABNORMAL HIGH (ref 0–5)
pH: 6 (ref 5.0–8.0)

## 2019-10-24 LAB — BASIC METABOLIC PANEL
Anion gap: 10 (ref 5–15)
BUN: 16 mg/dL (ref 8–23)
CO2: 26 mmol/L (ref 22–32)
Calcium: 9.3 mg/dL (ref 8.9–10.3)
Chloride: 101 mmol/L (ref 98–111)
Creatinine, Ser: 0.74 mg/dL (ref 0.44–1.00)
GFR calc Af Amer: >60 mL/min (ref >60)
GFR calc non Af Amer: >60 mL/min (ref >60)
Glucose, Bld: 155 mg/dL — ABNORMAL HIGH (ref 70–99)
Potassium: 3.3 mmol/L — ABNORMAL LOW (ref 3.5–5.1)
Sodium: 137 mmol/L (ref 135–145)

## 2019-10-24 LAB — SARS CORONAVIRUS 2 BY RT PCR (HOSPITAL ORDER, PERFORMED IN ~~LOC~~ HOSPITAL LAB): SARS Coronavirus 2: NEGATIVE

## 2019-10-24 MED ORDER — OXYCODONE-ACETAMINOPHEN 5-325 MG PO TABS
1.0000 | ORAL_TABLET | ORAL | Status: DC | PRN
  Administered 2019-10-24: 1 via ORAL
  Filled 2019-10-24: qty 1

## 2019-10-24 MED ORDER — ONDANSETRON HCL 4 MG/2ML IJ SOLN
4.0000 mg | Freq: Once | INTRAMUSCULAR | Status: AC
  Administered 2019-10-24: 4 mg via INTRAVENOUS
  Filled 2019-10-24: qty 2

## 2019-10-24 MED ORDER — LEVOTHYROXINE SODIUM 88 MCG PO TABS
88.0000 ug | ORAL_TABLET | Freq: Every day | ORAL | Status: DC
  Administered 2019-10-25 – 2019-10-26 (×2): 88 ug via ORAL
  Filled 2019-10-24 (×2): qty 1

## 2019-10-24 MED ORDER — ESTRADIOL 1 MG PO TABS
0.5000 mg | ORAL_TABLET | Freq: Every evening | ORAL | Status: DC
  Administered 2019-10-24 – 2019-10-25 (×2): 0.5 mg via ORAL
  Filled 2019-10-24 (×3): qty 0.5

## 2019-10-24 MED ORDER — ONDANSETRON HCL 4 MG/2ML IJ SOLN: 4.0000 mg | Freq: Three times a day (TID) | INTRAMUSCULAR | Status: DC | PRN

## 2019-10-24 MED ORDER — FLUTICASONE PROPIONATE 50 MCG/ACT NA SUSP
1.0000 | Freq: Every day | NASAL | Status: DC | PRN
  Filled 2019-10-24: qty 16

## 2019-10-24 MED ORDER — OXYCODONE-ACETAMINOPHEN 5-325 MG PO TABS
1.0000 | ORAL_TABLET | Freq: Four times a day (QID) | ORAL | Status: AC | PRN
  Administered 2019-10-24: 1 via ORAL
  Filled 2019-10-24: qty 1

## 2019-10-24 MED ORDER — ENOXAPARIN SODIUM 40 MG/0.4ML ~~LOC~~ SOLN
40.0000 mg | SUBCUTANEOUS | Status: DC
  Administered 2019-10-24 – 2019-10-25 (×2): 40 mg via SUBCUTANEOUS
  Filled 2019-10-24 (×2): qty 0.4

## 2019-10-24 MED ORDER — ASPIRIN EC 81 MG PO TBEC
81.0000 mg | DELAYED_RELEASE_TABLET | Freq: Every day | ORAL | Status: DC
  Administered 2019-10-25 – 2019-10-26 (×2): 81 mg via ORAL
  Filled 2019-10-24 (×2): qty 1

## 2019-10-24 MED ORDER — ACETAMINOPHEN 325 MG PO TABS: 650.0000 mg | ORAL_TABLET | Freq: Four times a day (QID) | ORAL | Status: DC | PRN

## 2019-10-24 MED ORDER — SODIUM CHLORIDE 0.9 % IV SOLN
1000.0000 mL | Freq: Once | INTRAVENOUS | Status: AC
  Administered 2019-10-24: 1000 mL via INTRAVENOUS

## 2019-10-24 MED ORDER — POTASSIUM CHLORIDE CRYS ER 20 MEQ PO TBCR
30.0000 meq | EXTENDED_RELEASE_TABLET | Freq: Once | ORAL | Status: AC
  Administered 2019-10-24: 30 meq via ORAL
  Filled 2019-10-24: qty 2

## 2019-10-24 MED ORDER — MORPHINE SULFATE (PF) 2 MG/ML IV SOLN: 1.0000 mg | INTRAVENOUS | Status: DC | PRN

## 2019-10-24 MED ORDER — SODIUM CHLORIDE 0.9 % IV SOLN
1.0000 g | Freq: Once | INTRAVENOUS | Status: AC
  Administered 2019-10-24: 1 g via INTRAVENOUS
  Filled 2019-10-24: qty 10

## 2019-10-24 MED ORDER — AMLODIPINE BESYLATE 5 MG PO TABS
5.0000 mg | ORAL_TABLET | Freq: Every day | ORAL | Status: DC
  Administered 2019-10-25 – 2019-10-26 (×2): 5 mg via ORAL
  Filled 2019-10-24 (×2): qty 1

## 2019-10-24 MED ORDER — ALPRAZOLAM 0.25 MG PO TABS: 0.1250 mg | ORAL_TABLET | Freq: Every evening | ORAL | Status: DC | PRN

## 2019-10-24 MED ORDER — PIPERACILLIN-TAZOBACTAM 3.375 G IVPB
3.3750 g | Freq: Three times a day (TID) | INTRAVENOUS | Status: DC
  Administered 2019-10-24 – 2019-10-26 (×6): 3.375 g via INTRAVENOUS
  Filled 2019-10-24 (×6): qty 50

## 2019-10-24 MED ORDER — MORPHINE SULFATE (PF) 4 MG/ML IV SOLN
4.0000 mg | Freq: Once | INTRAVENOUS | Status: AC
  Administered 2019-10-24: 4 mg via INTRAVENOUS
  Filled 2019-10-24: qty 1

## 2019-10-24 NOTE — H&P (Signed)
History and Physical    Michelle [name] WER:[id number] DOB: [birth year] DOA: 10/24/2019  Referring MD/NP/PA:   PCP: [name], MD   Patient coming from:  The patient is coming from home.  At baseline, pt is independent for most of ADL.        Chief Complaint: Bilateral flank pain, dysuria  HPI: Michelle [name] is a 69 y.o. female with medical history significant of hypertension, hypothyroidism, anxiety, kidney stone, who presents with bilateral flank pain and dysuria.  Patient states that her symptoms started last night, including bilateral flank pain, which is constant, dull, moderate, radiating to the groin area.  She also has mild dysuria and pressure feeling in the lower abdomen on urination.  No hematuria.  Patient has nausea, but no vomiting, diarrhea or abdominal pain.  Denies chest pain, shortness breath, cough, fever or chills.  No unilateral weakness.  ED Course: pt was found to have urinalysis (turbid appearance, large amount of leukocyte, positive nitrite, many bacteria, WBC > 50), WBC 13.6, potassium 3.3, renal function okay, temperature normal, blood pressure 133/77, heart rate 73, oxygen saturation 97% on room air.  Patient is placed on MedSurg bed for observation  CT -renal stone study showed: 1. No acute finding by CT.  2. 2 mm nonobstructing stone in the upper pole the right kidney. Chronic hydroureteronephrosis on the right without evidence of an obstructing stone. Findings appear quite similar to the study of 2014 and presumably relate to a distal ureteral structure. Patient has a degree of bladder prolapse which could possibly be affecting the UVJ.  Review of Systems:   General: no fevers, chills, no body weight gain, has fatigue HEENT: no blurry vision, hearing changes or sore throat Respiratory: no dyspnea, coughing, wheezing CV: no chest pain, no palpitations GI: no nausea, vomiting, abdominal pain, diarrhea, constipation GU: has dysuria, no burning on  urination, increased urinary frequency, hematuria. Has bilateral flank pain Ext: no leg edema Neuro: no unilateral weakness, numbness, or tingling, no vision change or hearing loss Skin: no rash, no skin tear. MSK: No muscle spasm, no deformity, no limitation of range of movement in spin Heme: No easy bruising.  Travel history: No recent long distant travel.  Allergy:  Allergies  Allergen Reactions  . Sulfa Antibiotics Hives and Nausea And Vomiting    Past Medical History:  Diagnosis Date  . Bladder incontinence   . Hypertension   . Hypothyroidism     Past Surgical History:  Procedure Laterality Date  . child birth natural    . diatation      Social History:  reports that she has quit smoking. Her smoking use included cigarettes. She has never used smokeless tobacco. She reports that she does not drink alcohol or use drugs.  Family History:  Family History  Problem Relation Age of Onset  . Cancer Father   . Hyperlipidemia Father   . Hypertension Father      Prior to Admission medications   Medication Sig Start Date End Date Taking? Authorizing Provider  ALPRAZolam (XANAX) 0.25 MG tablet Take 1 tablet (0.25 mg total) by mouth at bedtime as needed. 10/02/19   Carlean Jews, NP  amLODipine (NORVASC) 5 MG tablet Take 1 tablet (5 mg total) by mouth daily. 10/02/19   Carlean Jews, NP  estradiol (ESTRACE) 0.5 MG tablet Take 1 tablet (0.5 mg total) by mouth daily. 01/29/19   Carlean Jews, NP  fluticasone (FLONASE) 50 MCG/ACT nasal spray Place 1 spray  into both nostrils daily as needed. 04/08/19   Carlean Jews, NP  ibuprofen (ADVIL) 800 MG tablet Take 1 tablet (800 mg total) by mouth 2 (two) times daily as needed. 10/02/19   Carlean Jews, NP  levothyroxine (SYNTHROID) 88 MCG tablet Take 1 tablet (88 mcg total) by mouth daily before breakfast. 08/01/19   Carlean Jews, NP  pneumococcal 23 valent vaccine (PNEUMOVAX 23) 25 MCG/0.5ML injection Inject 0.76ml IM  once 02/24/19   Carlean Jews, NP  Zoster Vaccine Live, PF, (ZOSTAVAX) 62130 UNT/0.65ML injection  10/21/13   [provider]    Physical Exam: Vitals:   10/24/19 1011 10/24/19 1220 10/24/19 1640 10/24/19 1741  BP:  133/77 130/70 107/77  Pulse:  73 77 77  Resp:  17 16 14   Temp: 98.3 F (36.8 C)  98.4 F (36.9 C) 98.4 F (36.9 C)  TempSrc:   Oral Oral  SpO2:  97% 99% 99%  Weight:      Height:       General: Not in acute distress HEENT:       Eyes: PERRL, EOMI, no scleral icterus.       ENT: No discharge from the ears and nose, no pharynx injection, no tonsillar enlargement.        Neck: No JVD, no bruit, no mass felt. Heme: No neck lymph node enlargement. Cardiac: S1/S2, RRR, No murmurs, No gallops or rubs. Respiratory:  No rales, wheezing, rhonchi or rubs. GI: Soft, nondistended, nontender, no rebound pain, no organomegaly, BS present. GU: No hematuria. Has positive CVA tenderness bilaterally Ext: No pitting leg edema bilaterally. 2+DP/PT pulse bilaterally. Musculoskeletal: No joint deformities, No joint redness or warmth, no limitation of ROM in spin. Skin: No rashes.  Neuro: Alert, oriented X3, cranial nerves II-XII grossly intact, moves all extremities normally.   Psych: Patient is not psychotic, no suicidal or hemocidal ideation.  Labs on Admission: I have personally reviewed following labs and imaging studies  CBC: Recent Labs  Lab 10/24/19 1019  WBC 13.6*  HGB 14.5  HCT 42.2  MCV 84.2  PLT 191   Basic Metabolic Panel: Recent Labs  Lab 10/24/19 1019  NA 137  K 3.3*  CL 101  CO2 26  GLUCOSE 155*  BUN 16  CREATININE 0.74  CALCIUM 9.3   GFR: Estimated Creatinine Clearance: 57.3 mL/min (by C-G formula based on SCr of 0.74 mg/dL). Liver Function Tests: No results for input(s): AST, ALT, ALKPHOS, BILITOT, PROT, ALBUMIN in the last 168 hours. No results for input(s): LIPASE, AMYLASE in the last 168 hours. No results for input(s): AMMONIA in  the last 168 hours. Coagulation Profile: No results for input(s): INR, PROTIME in the last 168 hours. Cardiac Enzymes: No results for input(s): CKTOTAL, CKMB, CKMBINDEX, TROPONINI in the last 168 hours. BNP (last 3 results) No results for input(s): PROBNP in the last 8760 hours. HbA1C: No results for input(s): HGBA1C in the last 72 hours. CBG: No results for input(s): GLUCAP in the last 168 hours. Lipid Profile: No results for input(s): CHOL, HDL, LDLCALC, TRIG, CHOLHDL, LDLDIRECT in the last 72 hours. Thyroid Function Tests: No results for input(s): TSH, T4TOTAL, FREET4, T3FREE, THYROIDAB in the last 72 hours. Anemia Panel: No results for input(s): VITAMINB12, FOLATE, FERRITIN, TIBC, IRON, RETICCTPCT in the last 72 hours. Urine analysis:    Component Value Date/Time   COLORURINE AMBER (A) 10/24/2019 1019   APPEARANCEUR TURBID (A) 10/24/2019 1019   APPEARANCEUR Clear 02/19/2018 0955   LABSPEC 1.012  10/24/2019 1019   PHURINE 6.0 10/24/2019 1019   GLUCOSEU NEGATIVE 10/24/2019 1019   HGBUR LARGE (A) 10/24/2019 1019   BILIRUBINUR NEGATIVE 10/24/2019 1019   BILIRUBINUR negative 03/20/2019 0928   BILIRUBINUR Negative 02/19/2018 0955   KETONESUR NEGATIVE 10/24/2019 1019   PROTEINUR 100 (A) 10/24/2019 1019   UROBILINOGEN 0.2 03/20/2019 0928   NITRITE POSITIVE (A) 10/24/2019 1019   LEUKOCYTESUR LARGE (A) 10/24/2019 1019   Sepsis Labs: @LABRCNTIP (procalcitonin:4,lacticidven:4) ) Recent Results (from the past 240 hour(s))  SARS Coronavirus 2 by RT PCR (hospital order, performed in [place] hospital lab) Nasopharyngeal Nasopharyngeal Swab     Status: None   Collection Time: 10/24/19  2:28 PM   Specimen: Nasopharyngeal Swab  Result Value Ref Range Status   SARS Coronavirus 2 NEGATIVE NEGATIVE Final    Comment: (NOTE) SARS-CoV-2 target nucleic acids are NOT DETECTED. The SARS-CoV-2 RNA is generally detectable in upper and lower respiratory specimens during the acute phase of  infection. The lowest concentration of SARS-CoV-2 viral copies this assay can detect is 250 copies / mL. A negative result does not preclude SARS-CoV-2 infection and should not be used as the sole basis for treatment or other patient management decisions.  A negative result may occur with improper specimen collection / handling, submission of specimen other than nasopharyngeal swab, presence of viral mutation(s) within the areas targeted by this assay, and inadequate number of viral copies (<250 copies / mL). A negative result must be combined with clinical observations, patient history, and epidemiological information. Fact Sheet for Patients:   StrictlyIdeas.no Fact Sheet for Healthcare Providers: BankingDealers.co.za This test is not yet approved or cleared  by the [place] FDA and has been authorized for detection and/or diagnosis of SARS-CoV-2 by FDA under an Emergency Use Authorization (EUA).  This EUA will remain in effect (meaning this test can be used) for the duration of the COVID-19 declaration under Section 564(b)(1) of the Act, 21 U.S.C. section 360bbb-3(b)(1), unless the authorization is terminated or revoked sooner. Performed at [facility], [address] [place], [place] [address]      Radiological Exams on Admission: CT Renal Stone Study  Result Date: 10/24/2019 CLINICAL DATA:  Right flank pain beginning last night. History of kidney stones. EXAM: CT ABDOMEN AND PELVIS WITHOUT CONTRAST TECHNIQUE: Multidetector CT imaging of the abdomen and pelvis was performed following the standard protocol without IV contrast. COMPARISON:  02/03/2013 FINDINGS: Lower chest: Normal Hepatobiliary: No calcified gallstones. Few scattered 1 cm low densities are unchanged since 2014 and consistent with small cysts. Pancreas: Normal Spleen: Normal Adrenals/Urinary Tract: Adrenal glands are normal. Left kidney is normal. Right  kidney contains a 2 mm nonobstructing stone in the upper pole. Areas of focal renal atrophy are noted. Fullness of the renal collecting system and ureter all the way to the distal ureter. Similar to the previous study. No sign of obstructing stone. No stone in the bladder. There is a degree of bladder prolapse. Stomach/Bowel: No acute bowel finding. No ileus or obstruction. No sign of inflammatory disease or mass. Vascular/Lymphatic: Aortic atherosclerosis. No aneurysm. IVC is normal. No retroperitoneal adenopathy. Reproductive: Previous hysterectomy.  No pelvic mass. Other: No free fluid or air. Musculoskeletal: No significant bone finding. IMPRESSION: No acute finding by CT. 2 mm nonobstructing stone in the upper pole the right kidney. Chronic hydroureteronephrosis on the right without evidence of an obstructing stone. Findings appear quite similar to the study of 2014 and presumably relate to a distal ureteral structure. Patient has  a degree of bladder prolapse which could possibly be affecting the UVJ. Aortic Atherosclerosis (ICD10-I70.0). Electronically Signed   By: Paulina Fusi M.D.   On: 10/24/2019 13:26     EKG:  Not done in ED  Assessment/Plan Principal Problem:   Acute pyelonephritis Active Problems:   Essential hypertension   Hypothyroidism   Generalized anxiety disorder   Hypokalemia   Acute pyelonephritis: CT scan showed no obstructive stone.  Patient has leukocytosis, but no fever.  Clinically not septic.  Hemodynamic stable.  -Placed on MedSurg bed for position -IV Zosyn (patient received 1 dose of Rocephin in ED).  Patient had a history of positive culture for Morganella morganii which is sensitive to Zosyn -As needed Zofran -Follow-up blood culture urine culture -prn   HTN:  -Continue home medications: Amlodipine  Hypothyroidism -Synthroid  Generalized anxiety disorder -Continue home Xanax  Hypokalemia: Potassium 3.3 -Repleted -Check magnesium level     DVT  ppx:  SQ Lovenox Code Status: Partial code (I discussed with the patient in the presence of her husband, and explained the meaning of CODE STATUS, patient wants to be partial code, OK for CPR, but no intubation).  Family Communication:   Yes, patient's husband at bed side Disposition Plan:  Anticipate discharge back to previous environment Consults called:  none Admission status: Med-surg bed for obs      Status is: Observation  The patient remains OBS appropriate and will d/c before 2 midnights.  Dispo: The patient is from: Home              Anticipated d/c is to: Home              Anticipated d/c date is: 1 day              Patient currently is not medically stable to d/c.           Date of Service 10/24/2019    Lorretta Harp [facility]   If 7PM-7AM, please contact night-coverage [email or web address] 10/24/2019, 5:52 PM

## 2019-10-24 NOTE — Consult Note (Signed)
Pharmacy Antibiotic Note  Michelle [name] is a 69 y.o. female admitted on 10/24/2019 with pyelonephritis.  She has a history of UTIs including MDR Morganella that was sensitive to Zosyn. Pharmacy has been consulted for Zosyn dosing.  Plan:  Start Zosyn 3.375g EI IV every 8 hours   Height: 5\' 2"  (157.5 cm) Weight: 63.5 kg (140 lb) IBW/kg (Calculated) : 50.1  Temp (24hrs), Avg:98.3 F (36.8 C), Min:98.3 F (36.8 C), Max:98.3 F (36.8 C)  Recent Labs  Lab 10/24/19 1019  WBC 13.6*  CREATININE 0.74    Estimated Creatinine Clearance: 57.3 mL/min (by C-G formula based on SCr of 0.74 mg/dL).    Allergies  Allergen Reactions  . Sulfa Antibiotics Hives and Nausea And Vomiting    Antimicrobials this admission: Zosyn 6/4 >>   Microbiology results: 6/4 BCx: pending 6/4 UCx: pending 10/5 UCx: M morganii (MDR) 6/4 SARS CoV-2: pending   Thank you for allowing pharmacy to be a part of this patient's care.  8/4 10/24/2019 2:20 PM

## 2019-10-24 NOTE — ED Notes (Signed)
Pt c/o lower back pain that radiates to the rt groin area and down to side of right leg, pain started yesterday. Pt has hx of kidney stones and states this feels the same. Pt had appt for assessment in December but missed.

## 2019-10-24 NOTE — ED Triage Notes (Signed)
Pt c/o right flank pain that radiates around to the abd since last night with a hx of kidney stones and states this feels similar. Denies N/V at this time.

## 2019-10-24 NOTE — ED Provider Notes (Signed)
[facility] Emergency Department Provider Note   ____________________________________________    I have reviewed the triage vital signs and the nursing notes.   HISTORY  Chief Complaint Flank Pain     HPI Michelle [name] is a 68 y.o. female who presents with complaints of right greater than left flank pain.  Patient describes right flank pain became significantly worse last night.  She feels mildly nauseated, no vomiting.  No reports of fevers.  No significant dysuria noted.  Noticed bilateral back pain last night.  History of a kidney stone many years ago.  She had to have lithotripsy.  Has not taken anything for this.  No sick contacts.  Past Medical History:  Diagnosis Date  . Bladder incontinence   . Hypertension   . Hypothyroidism     Patient Active Problem List   Diagnosis Date Noted  . Acute pyelonephritis 10/24/2019  . Hypokalemia 10/24/2019  . Chronic right-sided low back pain with right-sided sciatica 10/08/2019  . Encounter for long-term (current) use of medications 10/08/2019  . Renal calculi 04/20/2019  . 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 Hematuria 04/05/2019  . Encounter for general adult medical examination with abnormal findings 03/05/2019  . Urinary tract infection with hematuria 03/05/2019  . Need for vaccination against Streptococcus pneumoniae using pneumococcal conjugate vaccine 7 03/05/2019  . Dysuria 03/05/2019  . Other fatigue 11/25/2018  . Acute bronchitis 06/05/2018  . Cough 06/05/2018  . Unspecified menopausal and perimenopausal disorder 12/28/2017  . Generalized anxiety disorder 12/28/2017  . Essential hypertension 08/20/2017  . Hypothyroidism 08/20/2017  . Encounter for long-term (current) use of high-risk medication 08/20/2017    Past Surgical History:  Procedure Laterality Date  . child birth natural    . diatation      Prior to Admission medications   Medication Sig Start Date End Date Taking?  Authorizing Provider  ALPRAZolam (XANAX) 0.25 MG tablet Take 1 tablet (0.25 mg total) by mouth at bedtime as needed. 10/02/19   Carlean Jews, NP  amLODipine (NORVASC) 5 MG tablet Take 1 tablet (5 mg total) by mouth daily. 10/02/19   Carlean Jews, NP  estradiol (ESTRACE) 0.5 MG tablet Take 1 tablet (0.5 mg total) by mouth daily. 01/29/19   Carlean Jews, NP  fluticasone (FLONASE) 50 MCG/ACT nasal spray Place 1 spray into both nostrils daily as needed. 04/08/19   Carlean Jews, NP  ibuprofen (ADVIL) 800 MG tablet Take 1 tablet (800 mg total) by mouth 2 (two) times daily as needed. 10/02/19   Carlean Jews, NP  levothyroxine (SYNTHROID) 88 MCG tablet Take 1 tablet (88 mcg total) by mouth daily before breakfast. 08/01/19   Carlean Jews, NP  pneumococcal 23 valent vaccine (PNEUMOVAX 23) 25 MCG/0.5ML injection Inject 0.24ml IM once 02/24/19   Carlean Jews, NP     Allergies Sulfa antibiotics  Family History  Problem Relation Age of Onset  . Cancer Father   . Hyperlipidemia Father   . Hypertension Father     Social History Social History   Tobacco Use  . Smoking status: Former Smoker    Types: Cigarettes  . Smokeless tobacco: Never Used  Substance Use Topics  . Alcohol use: No  . Drug use: No    Review of Systems  Constitutional: No fever/chills Eyes: No visual changes.  ENT: No sore throat. Cardiovascular: Denies chest pain. Respiratory: Denies shortness of breath. Gastrointestinal: As above Genitourinary: No hematuria noted Musculoskeletal: Negative for back pain.  Skin: Negative for rash. Neurological: Negative for headaches   ____________________________________________   PHYSICAL EXAM:  VITAL SIGNS: ED Triage Vitals  Enc Vitals Group     BP 10/24/19 1009 140/89     Pulse Rate 10/24/19 1009 93     Resp 10/24/19 1009 16     Temp 10/24/19 1011 98.3 F (36.8 C)     Temp Source 10/24/19 1009 Oral     SpO2 10/24/19 1009 99 %     Weight  10/24/19 1009 63.5 kg (140 lb)     Height 10/24/19 1009 1.575 m (5\' 2" )     Head Circumference --      Peak Flow --      Pain Score 10/24/19 1009 7     Pain Loc --      Pain Edu? --      Excl. in [place]? --     Constitutional: Alert and oriented. No acute distress.   Nose: No congestion/rhinnorhea. Mouth/Throat: Mucous membranes are moist.   Neck:  Painless ROM Cardiovascular: Normal rate, regular rhythm.  Good peripheral circulation. Respiratory: Normal respiratory effort.  No retractions. Gastrointestinal: Soft and nontender. No distention.  Positive CVA tenderness bilaterally  Musculoskeletal: No lower extremity tenderness nor edema.  Warm and well perfused Neurologic:  Normal speech and language. No gross focal neurologic deficits are appreciated.  Skin:  Skin is warm, dry and intact. No rash noted. Psychiatric: Mood and affect are normal. Speech and behavior are normal.  ____________________________________________   LABS (all labs ordered are listed, but only abnormal results are displayed)  Labs Reviewed  URINALYSIS, COMPLETE (UACMP) WITH MICROSCOPIC - Abnormal; Notable for the following components:      Result Value   Color, Urine AMBER (*)    APPearance TURBID (*)    Hgb urine dipstick LARGE (*)    Protein, ur 100 (*)    Nitrite POSITIVE (*)    Leukocytes,Ua LARGE (*)    WBC, UA >50 (*)    Bacteria, UA MANY (*)    All other components within normal limits  CBC - Abnormal; Notable for the following components:   WBC 13.6 (*)    All other components within normal limits  BASIC METABOLIC PANEL - Abnormal; Notable for the following components:   Potassium 3.3 (*)    Glucose, Bld 155 (*)    All other components within normal limits  URINE CULTURE  SARS CORONAVIRUS 2 BY RT PCR (HOSPITAL ORDER, [place] [facility])  CULTURE, BLOOD (ROUTINE X 2)  CULTURE, BLOOD (ROUTINE X 2)    ____________________________________________  EKG  None ____________________________________________  RADIOLOGY  CT does not demonstrate ureterolithiasis, chronic hydroureteronephrosis on the right ____________________________________________   PROCEDURES  Procedure(s) performed: No  Procedures   Critical Care performed: No ____________________________________________   INITIAL IMPRESSION / ASSESSMENT AND PLAN / ED COURSE  Pertinent labs & imaging results that were available during my care of the patient were reviewed by me and considered in my medical decision making (see chart for details).  Patient presents with bilateral flank pain, right greater than left.  Differential includes ureterolithiasis, urinary tract infection/pyelonephritis, colitis.  Urinalysis is consistent with urinary tract infection, will need to obtain CT renal stone study to determine whether obstructing ureterolithiasis is present.  Will treat with IV morphine, IV Zofran, IV fluids   Mild elevation in white blood cell count noted consistent with bacterial infection.  CT scan does not demonstrate ureterolithiasis however chronic hydroureteronephrosis noted.  I suspect  patient has early pyelonephritis given her elevated white blood cell count significant CVA discomfort and urinalysis.  IV Rocephin ordered, discussed with the hospitalist for admission    ____________________________________________   FINAL CLINICAL IMPRESSION(S) / ED DIAGNOSES  Final diagnoses:  Pyelonephritis        Note:  This document was prepared using Dragon voice recognition software and may include unintentional dictation errors.   Jene Every, MD 10/24/19 1428

## 2019-10-24 NOTE — Plan of Care (Signed)
Continuing with plan of care. 

## 2019-10-25 DIAGNOSIS — Z8249 Family history of ischemic heart disease and other diseases of the circulatory system: Secondary | ICD-10-CM | POA: Diagnosis not present

## 2019-10-25 DIAGNOSIS — E039 Hypothyroidism, unspecified: Secondary | ICD-10-CM | POA: Diagnosis present

## 2019-10-25 DIAGNOSIS — Z87440 Personal history of urinary (tract) infections: Secondary | ICD-10-CM | POA: Diagnosis not present

## 2019-10-25 DIAGNOSIS — Z79890 Hormone replacement therapy (postmenopausal): Secondary | ICD-10-CM | POA: Diagnosis not present

## 2019-10-25 DIAGNOSIS — N136 Pyonephrosis: Secondary | ICD-10-CM | POA: Diagnosis present

## 2019-10-25 DIAGNOSIS — F411 Generalized anxiety disorder: Secondary | ICD-10-CM | POA: Diagnosis present

## 2019-10-25 DIAGNOSIS — Z882 Allergy status to sulfonamides status: Secondary | ICD-10-CM | POA: Diagnosis not present

## 2019-10-25 DIAGNOSIS — N8110 Cystocele, unspecified: Secondary | ICD-10-CM | POA: Diagnosis present

## 2019-10-25 DIAGNOSIS — N10 Acute tubulo-interstitial nephritis: Secondary | ICD-10-CM

## 2019-10-25 DIAGNOSIS — R109 Unspecified abdominal pain: Secondary | ICD-10-CM | POA: Diagnosis present

## 2019-10-25 DIAGNOSIS — N12 Tubulo-interstitial nephritis, not specified as acute or chronic: Secondary | ICD-10-CM | POA: Diagnosis present

## 2019-10-25 DIAGNOSIS — Z87891 Personal history of nicotine dependence: Secondary | ICD-10-CM | POA: Diagnosis not present

## 2019-10-25 DIAGNOSIS — Z87442 Personal history of urinary calculi: Secondary | ICD-10-CM | POA: Diagnosis not present

## 2019-10-25 DIAGNOSIS — Z20822 Contact with and (suspected) exposure to covid-19: Secondary | ICD-10-CM | POA: Diagnosis present

## 2019-10-25 DIAGNOSIS — E876 Hypokalemia: Secondary | ICD-10-CM | POA: Diagnosis present

## 2019-10-25 DIAGNOSIS — Z79899 Other long term (current) drug therapy: Secondary | ICD-10-CM | POA: Diagnosis not present

## 2019-10-25 DIAGNOSIS — I10 Essential (primary) hypertension: Secondary | ICD-10-CM | POA: Diagnosis present

## 2019-10-25 LAB — CBC
HCT: 40.3 % (ref 36.0–46.0)
Hemoglobin: 13.1 g/dL (ref 12.0–15.0)
MCH: 28.5 pg (ref 26.0–34.0)
MCHC: 32.5 g/dL (ref 30.0–36.0)
MCV: 87.8 fL (ref 80.0–100.0)
Platelets: 177 10*3/uL (ref 150–400)
RBC: 4.59 MIL/uL (ref 3.87–5.11)
RDW: 14.3 % (ref 11.5–15.5)
WBC: 9.8 10*3/uL (ref 4.0–10.5)
nRBC: 0 % (ref 0.0–0.2)

## 2019-10-25 LAB — BASIC METABOLIC PANEL
Anion gap: 4 — ABNORMAL LOW (ref 5–15)
BUN: 13 mg/dL (ref 8–23)
CO2: 28 mmol/L (ref 22–32)
Calcium: 8.8 mg/dL — ABNORMAL LOW (ref 8.9–10.3)
Chloride: 107 mmol/L (ref 98–111)
Creatinine, Ser: 0.7 mg/dL (ref 0.44–1.00)
GFR calc Af Amer: >60 mL/min (ref >60)
GFR calc non Af Amer: >60 mL/min (ref >60)
Glucose, Bld: 102 mg/dL — ABNORMAL HIGH (ref 70–99)
Potassium: 3.8 mmol/L (ref 3.5–5.1)
Sodium: 139 mmol/L (ref 135–145)

## 2019-10-25 LAB — HIV ANTIBODY (ROUTINE TESTING W REFLEX): HIV Screen 4th Generation wRfx: NONREACTIVE

## 2019-10-25 LAB — MAGNESIUM: Magnesium: 2.2 mg/dL (ref 1.7–2.4)

## 2019-10-25 MED ORDER — ALUM & MAG HYDROXIDE-SIMETH 200-200-20 MG/5ML PO SUSP
30.0000 mL | ORAL | Status: DC | PRN
  Administered 2019-10-25: 30 mL via ORAL
  Filled 2019-10-25: qty 30

## 2019-10-25 MED ORDER — SODIUM CHLORIDE 0.9 % IV SOLN
INTRAVENOUS | Status: DC | PRN
  Administered 2019-10-25: 250 mL via INTRAVENOUS

## 2019-10-25 NOTE — Progress Notes (Addendum)
PROGRESS NOTE    Michelle [name]  CXK:[id number] DOB: 12-16-1950 DOA: 10/24/2019 PCP: Lavera Guise, MD   Brief Narrative: HPI: Michelle [name] is a 69 y.o. female with medical history significant of hypertension, hypothyroidism, anxiety, kidney stone, who presents with bilateral flank pain and dysuria.  Patient states that her symptoms started last night, including bilateral flank pain, which is constant, dull, moderate, radiating to the groin area.  She also has mild dysuria and pressure feeling in the lower abdomen on urination.  No hematuria.  Patient has nausea, but no vomiting, diarrhea or abdominal pain.  Denies chest pain, shortness breath, cough, fever or chills.  No unilateral weakness.  6/5: Patient seen and examined.  Symptoms improved however still endorsing mild bilateral back pain.  No fevers noted over interval.  Remains on IV Zosyn.   Assessment & Plan:   Principal Problem:   Acute pyelonephritis Active Problems:   Essential hypertension   Hypothyroidism   Generalized anxiety disorder   Hypokalemia  Acute pyelonephritis CT scan showed no obstructive stone.   Patient has leukocytosis, but no fever.   Clinically not septic.   Hemodynamic stable. -Improving over interval- -urine culture data pending Plan: Continue intravenous Zosyn, patient received Rocephin in ED Patient has a history of positive culture for Morganella morganii sensitive to Zosyn Follow urine culture for sensitivities Follow blood culture Pain control as needed Antiemetic as needed   HTN:  -Continue home medications: Amlodipine  Hypothyroidism -Synthroid  Generalized anxiety disorder -Continue home Xanax  Hypokalemia: Potassium 3.3 -Repleted    DVT prophylaxis: Lovenox Code Status: Full Family Communication: None today  disposition Plan: Status is: Inpatient  The patient will require care spanning > 2 midnights and should be moved to inpatient because: Inpatient level of  care appropriate due to severity of illness  Dispo: The patient is from: Home              Anticipated d/c is to: Home              Anticipated d/c date is: 1 day              Patient currently is not medically stable to d/c.  This patient is still symptomatic with clinical presentation consistent with acute pyelonephritis.  Remains on intravenous Zosyn.  Urine culture and sensitivities currently pending.  Still with bilateral CVA tenderness consistent with acute pyelonephritis.  At this time severity of illness and current presentation support inpatient status.  Consultants:   None  Procedures:   None  Antimicrobials:   Zosyn, 10/24/2019-   Subjective: Patient seen and examined.  Pleasant, no distress.  Nontoxic-appearing.  Does endorse bilateral CVA tenderness  Objective: Vitals:   10/24/19 1741 10/24/19 2003 10/25/19 0118 10/25/19 0827  BP: 107/77 (!) 141/73 120/73 123/76  Pulse: 77 78 67 70  Resp: 14 20 20 18   Temp: 98.4 F (36.9 C) 98.8 F (37.1 C) 97.9 F (36.6 C) (!) 97.1 F (36.2 C)  TempSrc: Oral Oral Oral Axillary  SpO2: 99% 98% 99% 99%  Weight:      Height:        Intake/Output Summary (Last 24 hours) at 10/25/2019 1333 Last data filed at 10/25/2019 0900 Gross per 24 hour  Intake 1678.39 ml  Output 570 ml  Net 1108.39 ml   Filed Weights   10/24/19 1009  Weight: 63.5 kg    Examination:  General exam: Appears calm and comfortable  Respiratory system: Clear to auscultation. Respiratory effort  normal. Cardiovascular system: S1 & S2 heard, RRR. No JVD, murmurs, rubs, gallops or clicks. No pedal edema. Gastrointestinal system: Bilateral CVA tenderness, abdomen soft nontender nondistended, normal bowel sounds Central nervous system: Alert and oriented. No focal neurological deficits. Extremities: Symmetric 5 x 5 power. Skin: No rashes, lesions or ulcers Psychiatry: Judgement and insight appear normal. Mood & affect appropriate.     Data Reviewed: I  have personally reviewed following labs and imaging studies  CBC: Recent Labs  Lab 10/24/19 1019 10/25/19 0431  WBC 13.6* 9.8  HGB 14.5 13.1  HCT 42.2 40.3  MCV 84.2 87.8  PLT 191 177   Basic Metabolic Panel: Recent Labs  Lab 10/24/19 1019 10/25/19 0431  NA 137 139  K 3.3* 3.8  CL 101 107  CO2 26 28  GLUCOSE 155* 102*  BUN 16 13  CREATININE 0.74 0.70  CALCIUM 9.3 8.8*  MG  --  2.2   GFR: Estimated Creatinine Clearance: 57.3 mL/min (by C-G formula based on SCr of 0.7 mg/dL). Liver Function Tests: No results for input(s): AST, ALT, ALKPHOS, BILITOT, PROT, ALBUMIN in the last 168 hours. No results for input(s): LIPASE, AMYLASE in the last 168 hours. No results for input(s): AMMONIA in the last 168 hours. Coagulation Profile: No results for input(s): INR, PROTIME in the last 168 hours. Cardiac Enzymes: No results for input(s): CKTOTAL, CKMB, CKMBINDEX, TROPONINI in the last 168 hours. BNP (last 3 results) No results for input(s): PROBNP in the last 8760 hours. HbA1C: No results for input(s): HGBA1C in the last 72 hours. CBG: No results for input(s): GLUCAP in the last 168 hours. Lipid Profile: No results for input(s): CHOL, HDL, LDLCALC, TRIG, CHOLHDL, LDLDIRECT in the last 72 hours. Thyroid Function Tests: No results for input(s): TSH, T4TOTAL, FREET4, T3FREE, THYROIDAB in the last 72 hours. Anemia Panel: No results for input(s): VITAMINB12, FOLATE, FERRITIN, TIBC, IRON, RETICCTPCT in the last 72 hours. Sepsis Labs: No results for input(s): PROCALCITON, LATICACIDVEN in the last 168 hours.  Recent Results (from the past 240 hour(s))  Urine culture     Status: Abnormal (Preliminary result)   Collection Time: 10/24/19 10:19 AM   Specimen: Urine, Random  Result Value Ref Range Status   Specimen Description   Final    URINE, RANDOM Performed at [facility], [address], [place], [place] [address]    Special Requests   Final    NONE Performed at  [facility], [address], [place], [place] [address]    Culture (A)  Final    >=100,000 COLONIES/mL ESCHERICHIA COLI SUSCEPTIBILITIES TO FOLLOW Performed at [facility], [address] [place], [place] [address]    Report Status PENDING  Incomplete  SARS Coronavirus 2 by RT PCR (hospital order, performed in [facility] hospital lab) Nasopharyngeal Nasopharyngeal Swab     Status: None   Collection Time: 10/24/19  2:28 PM   Specimen: Nasopharyngeal Swab  Result Value Ref Range Status   SARS Coronavirus 2 NEGATIVE NEGATIVE Final    Comment: (NOTE) SARS-CoV-2 target nucleic acids are NOT DETECTED. The SARS-CoV-2 RNA is generally detectable in upper and lower respiratory specimens during the acute phase of infection. The lowest concentration of SARS-CoV-2 viral copies this assay can detect is 250 copies / mL. A negative result does not preclude SARS-CoV-2 infection and should not be used as the sole basis for treatment or other patient management decisions.  A negative result may occur with improper specimen collection / handling,  submission of specimen other than nasopharyngeal swab, presence of viral mutation(s) within the areas targeted by this assay, and inadequate number of viral copies (<250 copies / mL). A negative result must be combined with clinical observations, patient history, and epidemiological information. Fact Sheet for Patients:   BoilerBrush.com.cy Fact Sheet for Healthcare Providers: https://pope.com/ This test is not yet approved or cleared  by the [place] FDA and has been authorized for detection and/or diagnosis of SARS-CoV-2 by FDA under an Emergency Use Authorization (EUA).  This EUA will remain in effect (meaning this test can be used) for the duration of the COVID-19 declaration under Section 564(b)(1) of the Act, 21 U.S.C. section 360bbb-3(b)(1), unless the authorization is terminated  or revoked sooner. Performed at [facility], [address] [place], [place] [address]   CULTURE, BLOOD (ROUTINE X 2) w Reflex to ID Panel     Status: None (Preliminary result)   Collection Time: 10/24/19  2:29 PM   Specimen: BLOOD  Result Value Ref Range Status   Specimen Description BLOOD RIGHT RADIAL  Final   Special Requests   Final    BOTTLES DRAWN AEROBIC AND ANAEROBIC Blood Culture adequate volume   Culture   Final    NO GROWTH < 24 HOURS Performed at [facility], [address], [place], [place] [address]    Report Status PENDING  Incomplete  CULTURE, BLOOD (ROUTINE X 2) w Reflex to ID Panel     Status: None (Preliminary result)   Collection Time: 10/24/19  3:26 PM   Specimen: BLOOD  Result Value Ref Range Status   Specimen Description BLOOD RIGHT ANTECUBITAL  Final   Special Requests   Final    BOTTLES DRAWN AEROBIC AND ANAEROBIC Blood Culture adequate volume   Culture   Final    NO GROWTH < 24 HOURS Performed at [facility], [address] [place], [place] [address]    Report Status PENDING  Incomplete         Radiology Studies: CT Renal Stone Study  Result Date: 10/24/2019 CLINICAL DATA:  Right flank pain beginning last night. History of kidney stones. EXAM: CT ABDOMEN AND PELVIS WITHOUT CONTRAST TECHNIQUE: Multidetector CT imaging of the abdomen and pelvis was performed following the standard protocol without IV contrast. COMPARISON:  02/03/2013 FINDINGS: Lower chest: Normal Hepatobiliary: No calcified gallstones. Few scattered 1 cm low densities are unchanged since 2014 and consistent with small cysts. Pancreas: Normal Spleen: Normal Adrenals/Urinary Tract: Adrenal glands are normal. Left kidney is normal. Right kidney contains a 2 mm nonobstructing stone in the upper pole. Areas of focal renal atrophy are noted. Fullness of the renal collecting system and ureter all the way to the distal ureter. Similar to the previous study. No  sign of obstructing stone. No stone in the bladder. There is a degree of bladder prolapse. Stomach/Bowel: No acute bowel finding. No ileus or obstruction. No sign of inflammatory disease or mass. Vascular/Lymphatic: Aortic atherosclerosis. No aneurysm. IVC is normal. No retroperitoneal adenopathy. Reproductive: Previous hysterectomy.  No pelvic mass. Other: No free fluid or air. Musculoskeletal: No significant bone finding. IMPRESSION: No acute finding by CT. 2 mm nonobstructing stone in the upper pole the right kidney. Chronic hydroureteronephrosis on the right without evidence of an obstructing stone. Findings appear quite similar to the study of 2014 and presumably relate to a distal ureteral structure. Patient has a degree of bladder prolapse which could possibly be affecting the UVJ. Aortic Atherosclerosis (ICD10-I70.0). Electronically Signed  By: Paulina Fusi M.D.   On: 10/24/2019 13:26        Scheduled Meds: . amLODipine  5 mg Oral Daily  . aspirin EC  81 mg Oral Daily  . enoxaparin (LOVENOX) injection  40 mg Subcutaneous Q24H  . estradiol  0.5 mg Oral QPM  . levothyroxine  88 mcg Oral QAC breakfast   Continuous Infusions: . sodium chloride Stopped (10/25/19 0243)  . piperacillin-tazobactam (ZOSYN)  IV 3.375 g (10/25/19 0909)     LOS: 0 days    Time spent: 35 minutes    Tresa Moore, MD [facility] Pager [phone]  If 7PM-7AM, please contact night-coverage 10/25/2019, 1:33 PM

## 2019-10-25 NOTE — Plan of Care (Signed)
Continuing with plan of care. 

## 2019-10-26 LAB — BASIC METABOLIC PANEL
Anion gap: 9 (ref 5–15)
BUN: 14 mg/dL (ref 8–23)
CO2: 27 mmol/L (ref 22–32)
Calcium: 9 mg/dL (ref 8.9–10.3)
Chloride: 103 mmol/L (ref 98–111)
Creatinine, Ser: 0.79 mg/dL (ref 0.44–1.00)
GFR calc Af Amer: >60 mL/min (ref >60)
GFR calc non Af Amer: >60 mL/min (ref >60)
Glucose, Bld: 108 mg/dL — ABNORMAL HIGH (ref 70–99)
Potassium: 3.7 mmol/L (ref 3.5–5.1)
Sodium: 139 mmol/L (ref 135–145)

## 2019-10-26 LAB — URINE CULTURE: Culture: >=100000 — AB

## 2019-10-26 MED ORDER — CEFDINIR 300 MG PO CAPS
300.0000 mg | ORAL_CAPSULE | Freq: Two times a day (BID) | ORAL | 0 refills | Status: AC
Start: 2019-10-26 — End: 2019-11-02

## 2019-10-26 NOTE — Plan of Care (Signed)
Continuing with plan of care. 

## 2019-10-26 NOTE — Discharge Instructions (Signed)
Pyelonephritis, Adult °Pyelonephritis is an infection that occurs in the kidney. The kidneys are the organs that filter a person's blood and move waste out of the bloodstream and into the urine. Urine passes from the kidneys, through tubes called ureters, and into the bladder. There are two main types of pyelonephritis: °· Infections that come on quickly without any warning (acute pyelonephritis). °· Infections that last for a long period of time (chronic pyelonephritis). °In most cases, the infection clears up with treatment and does not cause further problems. More severe infections or chronic infections can sometimes spread to the bloodstream or lead to other problems with the kidneys. °What are the causes? °This condition is usually caused by: °· Bacteria traveling from the bladder up to the kidney. This may occur after having a bladder infection (cystitis) or urinary tract infection (UTI). °· Bladder infections caused from bacteria traveling from the bloodstream to the kidney. °What increases the risk? °This condition is more likely to develop in: °· Pregnant women. °· Older people. °· People who have any of these conditions: °? Diabetes. °? Inflammation of the prostate gland (prostatitis), in males. °? Kidney stones or bladder stones. °? Other abnormalities of the kidney or ureter. °? Cancer. °· People who have a catheter placed in the bladder. °· People who are sexually active. °· Women who use spermicides. °· People who have had a prior UTI. °What are the signs or symptoms? °Symptoms of this condition include: °· Frequent urination. °· Strong or persistent urge to urinate. °· Burning or stinging when urinating. °· Abdominal pain. °· Back pain. °· Pain in the side or flank area. °· Fever or chills. °· Blood in the urine, or dark urine. °· Nausea or vomiting. °How is this diagnosed? °This condition may be diagnosed based on: °· Your medical history and a physical exam. °· Urine tests. °· Blood tests. °You may  also have imaging tests of the kidneys, such as an ultrasound or CT scan. °How is this treated? °Treatment for this condition may depend on the severity of the infection. °· If the infection is mild and is found early, you may be treated with antibiotic medicines taken by mouth (orally). You will need to drink fluids to remain hydrated. °· If the infection is more severe, you may need to stay in the hospital and receive antibiotics given directly into a vein through an IV. You may also need to receive fluids through an IV if you are not able to remain hydrated. After your hospital stay, you may need to take oral antibiotics for a period of time. °Other treatments may be required, depending on the cause of the infection. °Follow these instructions at home: °Medicines °· Take your antibiotic medicine as told by your health care provider. Do not stop taking the antibiotic even if you start to feel better. °· Take over-the-counter and prescription medicines only as told by your health care provider. °General instructions ° °· Drink enough fluid to keep your urine pale yellow. °· Avoid caffeine, tea, and carbonated beverages. They tend to irritate the bladder. °· Urinate often. Avoid holding in urine for long periods of time. °· Urinate before and after sex. °· After a bowel movement, women should cleanse from front to back. Use each tissue only once. °· Keep all follow-up visits as told by your health care provider. This is important. °Contact a health care provider if: °· Your symptoms do not get better after 2 days of treatment. °· Your symptoms get worse. °·   You have a fever. °Get help right away if you: °· Are unable to take your antibiotics or fluids. °· Have shaking chills. °· Vomit. °· Have severe flank or back pain. °· Have extreme weakness or fainting. °Summary °· Pyelonephritis is a urinary tract infection (UTI) that occurs in the kidney. °· Treatment for this condition may depend on the severity of the  infection. °· Take your antibiotic medicine as told by your health care provider. Do not stop taking the antibiotic even if you start to feel better. °· Drink enough fluid to keep your urine pale yellow. °· Keep all follow-up visits as told by your health care provider. This is important. °This information is not intended to replace advice given to you by your health care provider. Make sure you discuss any questions you have with your health care provider. °Document Revised: 03/12/2018 Document Reviewed: 03/12/2018 °Elsevier Patient Education © 2020 [employer] ° °

## 2019-10-26 NOTE — Plan of Care (Signed)
Discharge teaching completed with patient who verbalized understanding.  Patient is in stable condition and has all belongings. 

## 2019-10-26 NOTE — Discharge Summary (Signed)
Physician Discharge Summary  Michelle [name] HYI:[id number] DOB: 1951-01-26 DOA: 10/24/2019  PCP: [name], MD  Admit date: 10/24/2019 Discharge date: 10/26/2019  Admitted From: Home Disposition: Home  Recommendations for Outpatient Follow-up:  1. Follow up with PCP in 1-2 weeks 2. Follow-up with urology as directed  Home Health: No Equipment/Devices: None  Discharge Condition: Stable CODE STATUS: Partial Diet recommendation: Heart Healthy / Carb Modified   Brief/Interim Summary: NOM:VEHMCNO M Michelle [name] a 69 y.o.femalewith medical history significant ofhypertension, hypothyroidism, anxiety, kidney stone, who presents with bilateral flank pain and dysuria.  Patient states that her symptoms started last night, including bilateral flank pain, which is constant, dull, moderate, radiating to the groin area. She also has mild dysuria and pressure feeling in the lower abdomen on urination. No hematuria. Patient has nausea, but no vomiting, diarrhea or abdominal pain. Denies chest pain, shortness breath, cough, fever or chills. No unilateral weakness.  6/5: Patient seen and examined.  Symptoms improved however still endorsing mild bilateral back pain.  No fevers noted over interval.  Remains on IV Zosyn.  6/6: Patient seen and examined on the day of discharge.  Symptoms resolved.  No fevers noted.  Back pain improved.  Urine culture results reviewed.  E. coli with near pan sensitivity.  Resistant to who Valda Lamb.  Prescribed cefdinir 300 mg p.o. twice daily for total course of 7days.  Patient discharged home in stable condition.  Patient will follow up with PCP within 1 week and she has an established appointment with urology to discuss recurrent UTIs in connection with prolapsed bladder.   Discharge Diagnoses:  Principal Problem:   Acute pyelonephritis Active Problems:   Essential hypertension   Hypothyroidism   Generalized anxiety disorder   Hypokalemia    Pyelonephritis  Acute pyelonephritis CT scan showed no obstructive stone.  Patient has leukocytosis, but no fever.  Clinically not septic.  Hemodynamic stable. -Improving over interval- -urine culture demonstrates E. coli with near pan sensitivity Discharge plan: Stop intravenous Zosyn Start cefdinir 3 mg p.o. twice daily x7 days Follow-up outpatient PCP 1 week Follow-up outpatient urology   HTN:  -Continue home medications:Amlodipine  Hypothyroidism -Synthroid  Generalized anxiety disorder -Continue home Xanax  Discharge Instructions  Discharge Instructions    Diet - low sodium heart healthy   Complete by: As directed    Increase activity slowly   Complete by: As directed      Allergies as of 10/26/2019      Reactions   Sulfa Antibiotics Hives, Nausea And Vomiting      Medication List    TAKE these medications   ALPRAZolam 0.25 MG tablet Commonly known as: XANAX Take 1 tablet (0.25 mg total) by mouth at bedtime as needed. What changed:   how much to take  reasons to take this   amLODipine 5 MG tablet Commonly known as: NORVASC Take 1 tablet (5 mg total) by mouth daily.   aspirin EC 81 MG tablet Take 81 mg by mouth daily.   cefdinir 300 MG capsule Commonly known as: OMNICEF Take 1 capsule (300 mg total) by mouth 2 (two) times daily for 7 days.   estradiol 0.5 MG tablet Commonly known as: ESTRACE Take 1 tablet (0.5 mg total) by mouth daily. What changed: when to take this   fluticasone 50 MCG/ACT nasal spray Commonly known as: FLONASE Place 1 spray into both nostrils daily as needed. What changed: reasons to take this   ibuprofen 800 MG tablet Commonly known as: ADVIL  Take 1 tablet (800 mg total) by mouth 2 (two) times daily as needed. What changed: reasons to take this   levothyroxine 88 MCG tablet Commonly known as: SYNTHROID Take 1 tablet (88 mcg total) by mouth daily before breakfast.      Follow-up Information    [name], MD. Schedule an appointment as soon as possible for a visit in 1 week(s).   Specialty: Internal Medicine Contact information: [address] [place] [address] [phone]          Allergies  Allergen Reactions  . Sulfa Antibiotics Hives and Nausea And Vomiting    Consultations:  None   Procedures/Studies: CT Renal Stone Study  Result Date: 10/24/2019 CLINICAL DATA:  Right flank pain beginning last night. History of kidney stones. EXAM: CT ABDOMEN AND PELVIS WITHOUT CONTRAST TECHNIQUE: Multidetector CT imaging of the abdomen and pelvis was performed following the standard protocol without IV contrast. COMPARISON:  02/03/2013 FINDINGS: Lower chest: Normal Hepatobiliary: No calcified gallstones. Few scattered 1 cm low densities are unchanged since 2014 and consistent with small cysts. Pancreas: Normal Spleen: Normal Adrenals/Urinary Tract: Adrenal glands are normal. Left kidney is normal. Right kidney contains a 2 mm nonobstructing stone in the upper pole. Areas of focal renal atrophy are noted. Fullness of the renal collecting system and ureter all the way to the distal ureter. Similar to the previous study. No sign of obstructing stone. No stone in the bladder. There is a degree of bladder prolapse. Stomach/Bowel: No acute bowel finding. No ileus or obstruction. No sign of inflammatory disease or mass. Vascular/Lymphatic: Aortic atherosclerosis. No aneurysm. IVC is normal. No retroperitoneal adenopathy. Reproductive: Previous hysterectomy.  No pelvic mass. Other: No free fluid or air. Musculoskeletal: No significant bone finding. IMPRESSION: No acute finding by CT. 2 mm nonobstructing stone in the upper pole the right kidney. Chronic hydroureteronephrosis on the right without evidence of an obstructing stone. Findings appear quite similar to the study of 2014 and presumably relate to a distal ureteral structure. Patient has a degree of bladder prolapse which could possibly be  affecting the UVJ. Aortic Atherosclerosis (ICD10-I70.0). Electronically Signed   By: Paulina Fusi M.D.   On: 10/24/2019 13:26    (Echo, Carotid, EGD, Colonoscopy, ERCP)    Subjective: Seen and examined on day of discharge.  No distress.  Feels well.  Stable for discharge home.  Discharge Exam: Vitals:   10/25/19 1913 10/26/19 0412  BP: (!) 144/74 134/83  Pulse: 71 75  Resp: 16 16  Temp: 98.2 F (36.8 C) 98.5 F (36.9 C)  SpO2: 97% 97%   Vitals:   10/25/19 0827 10/25/19 1510 10/25/19 1913 10/26/19 0412  BP: 123/76 123/66 (!) 144/74 134/83  Pulse: 70 73 71 75  Resp: 18 18 16 16   Temp: (!) 97.1 F (36.2 C) (!) 97.5 F (36.4 C) 98.2 F (36.8 C) 98.5 F (36.9 C)  TempSrc: Axillary Oral Oral Oral  SpO2: 99% 99% 97% 97%  Weight:      Height:        General: Pt is alert, awake, not in acute distress Cardiovascular: RRR, S1/S2 +, no rubs, no gallops Respiratory: CTA bilaterally, no wheezing, no rhonchi Abdominal: Soft, NT, ND, bowel sounds + Extremities: no edema, no cyanosis    The results of significant diagnostics from this hospitalization (including imaging, microbiology, ancillary and laboratory) are listed below for reference.     Microbiology: Recent Results (from the past 240 hour(s))  Urine culture  Status: Abnormal   Collection Time: 10/24/19 10:19 AM   Specimen: Urine, Random  Result Value Ref Range Status   Specimen Description   Final    URINE, RANDOM Performed at [facility], [address] [place], [place] [address]    Special Requests   Final    NONE Performed at [facility], [address] [place], [place] [address]    Culture >=100,000 COLONIES/mL ESCHERICHIA COLI (A)  Final   Report Status 10/26/2019 FINAL  Final   Organism ID, Bacteria ESCHERICHIA COLI (A)  Final      Susceptibility   Escherichia coli - MIC*    AMPICILLIN <=2 SENSITIVE Sensitive     CEFAZOLIN <=4 SENSITIVE Sensitive     CEFTRIAXONE <=1 SENSITIVE  Sensitive     CIPROFLOXACIN >=4 RESISTANT Resistant     GENTAMICIN <=1 SENSITIVE Sensitive     IMIPENEM <=0.25 SENSITIVE Sensitive     NITROFURANTOIN <=16 SENSITIVE Sensitive     TRIMETH/SULFA >=320 RESISTANT Resistant     AMPICILLIN/SULBACTAM <=2 SENSITIVE Sensitive     PIP/TAZO <=4 SENSITIVE Sensitive     * >=100,000 COLONIES/mL ESCHERICHIA COLI  SARS Coronavirus 2 by RT PCR (hospital order, performed in [facility] hospital lab) Nasopharyngeal Nasopharyngeal Swab     Status: None   Collection Time: 10/24/19  2:28 PM   Specimen: Nasopharyngeal Swab  Result Value Ref Range Status   SARS Coronavirus 2 NEGATIVE NEGATIVE Final    Comment: (NOTE) SARS-CoV-2 target nucleic acids are NOT DETECTED. The SARS-CoV-2 RNA is generally detectable in upper and lower respiratory specimens during the acute phase of infection. The lowest concentration of SARS-CoV-2 viral copies this assay can detect is 250 copies / mL. A negative result does not preclude SARS-CoV-2 infection and should not be used as the sole basis for treatment or other patient management decisions.  A negative result may occur with improper specimen collection / handling, submission of specimen other than nasopharyngeal swab, presence of viral mutation(s) within the areas targeted by this assay, and inadequate number of viral copies (<250 copies / mL). A negative result must be combined with clinical observations, patient history, and epidemiological information. Fact Sheet for Patients:   BoilerBrush.com.cy Fact Sheet for Healthcare Providers: https://pope.com/ This test is not yet approved or cleared  by the [place] FDA and has been authorized for detection and/or diagnosis of SARS-CoV-2 by FDA under an Emergency Use Authorization (EUA).  This EUA will remain in effect (meaning this test can be used) for the duration of the COVID-19 declaration under Section 564(b)(1) of the  Act, 21 U.S.C. section 360bbb-3(b)(1), unless the authorization is terminated or revoked sooner. Performed at [facility], [address] [place], [place] [address]   CULTURE, BLOOD (ROUTINE X 2) w Reflex to ID Panel     Status: None (Preliminary result)   Collection Time: 10/24/19  2:29 PM   Specimen: BLOOD  Result Value Ref Range Status   Specimen Description BLOOD RIGHT RADIAL  Final   Special Requests   Final    BOTTLES DRAWN AEROBIC AND ANAEROBIC Blood Culture adequate volume   Culture   Final    NO GROWTH 2 DAYS Performed at [facility], [address] [place], [place] [address]    Report Status PENDING  Incomplete  CULTURE, BLOOD (ROUTINE X 2) w Reflex to ID Panel     Status: None (Preliminary result)   Collection Time: 10/24/19  3:26 PM   Specimen: BLOOD  Result Value Ref Range Status   Specimen Description BLOOD RIGHT ANTECUBITAL  Final   Special Requests   Final    BOTTLES DRAWN AEROBIC AND ANAEROBIC Blood Culture adequate volume   Culture   Final    NO GROWTH 2 DAYS Performed at [facility], [address], [place], [place] [address]    Report Status PENDING  Incomplete     Labs: BNP (last 3 results) No results for input(s): BNP in the last 8760 hours. Basic Metabolic Panel: Recent Labs  Lab 10/24/19 1019 10/25/19 0431 10/26/19 0658  NA 137 139 139  K 3.3* 3.8 3.7  CL 101 107 103  CO2 26 28 27   GLUCOSE 155* 102* 108*  BUN 16 13 14   CREATININE 0.74 0.70 0.79  CALCIUM 9.3 8.8* 9.0  MG  --  2.2  --    Liver Function Tests: No results for input(s): AST, ALT, ALKPHOS, BILITOT, PROT, ALBUMIN in the last 168 hours. No results for input(s): LIPASE, AMYLASE in the last 168 hours. No results for input(s): AMMONIA in the last 168 hours. CBC: Recent Labs  Lab 10/24/19 1019 10/25/19 0431  WBC 13.6* 9.8  HGB 14.5 13.1  HCT 42.2 40.3  MCV 84.2 87.8  PLT 191 177   Cardiac Enzymes: No results for input(s): CKTOTAL, CKMB,  CKMBINDEX, TROPONINI in the last 168 hours. BNP: Invalid input(s): POCBNP CBG: No results for input(s): GLUCAP in the last 168 hours. D-Dimer No results for input(s): DDIMER in the last 72 hours. Hgb A1c No results for input(s): HGBA1C in the last 72 hours. Lipid Profile No results for input(s): CHOL, HDL, LDLCALC, TRIG, CHOLHDL, LDLDIRECT in the last 72 hours. Thyroid function studies No results for input(s): TSH, T4TOTAL, T3FREE, THYROIDAB in the last 72 hours.  Invalid input(s): FREET3 Anemia work up No results for input(s): VITAMINB12, FOLATE, FERRITIN, TIBC, IRON, RETICCTPCT in the last 72 hours. Urinalysis    Component Value Date/Time   COLORURINE AMBER (A) 10/24/2019 1019   APPEARANCEUR TURBID (A) 10/24/2019 1019   APPEARANCEUR Clear 02/19/2018 0955   LABSPEC 1.012 10/24/2019 1019   PHURINE 6.0 10/24/2019 1019   GLUCOSEU NEGATIVE 10/24/2019 1019   HGBUR LARGE (A) 10/24/2019 1019   BILIRUBINUR NEGATIVE 10/24/2019 1019   BILIRUBINUR negative 03/20/2019 0928   BILIRUBINUR Negative 02/19/2018 0955   KETONESUR NEGATIVE 10/24/2019 1019   PROTEINUR 100 (A) 10/24/2019 1019   UROBILINOGEN 0.2 03/20/2019 0928   NITRITE POSITIVE (A) 10/24/2019 1019   LEUKOCYTESUR LARGE (A) 10/24/2019 1019   Sepsis Labs Invalid input(s): PROCALCITONIN,  WBC,  LACTICIDVEN Microbiology Recent Results (from the past 240 hour(s))  Urine culture     Status: Abnormal   Collection Time: 10/24/19 10:19 AM   Specimen: Urine, Random  Result Value Ref Range Status   Specimen Description   Final    URINE, RANDOM Performed at [facility], [address], [place], [place] [address]    Special Requests   Final    NONE Performed at [facility], [address] [place], [place] [address]    Culture >=100,000 COLONIES/mL ESCHERICHIA COLI (A)  Final   Report Status 10/26/2019 FINAL  Final   Organism ID, Bacteria ESCHERICHIA COLI (A)  Final      Susceptibility   Escherichia coli -  MIC*    AMPICILLIN <=2 SENSITIVE Sensitive     CEFAZOLIN <=4 SENSITIVE Sensitive     CEFTRIAXONE <=1 SENSITIVE Sensitive     CIPROFLOXACIN >=4 RESISTANT Resistant  GENTAMICIN <=1 SENSITIVE Sensitive     IMIPENEM <=0.25 SENSITIVE Sensitive     NITROFURANTOIN <=16 SENSITIVE Sensitive     TRIMETH/SULFA >=320 RESISTANT Resistant     AMPICILLIN/SULBACTAM <=2 SENSITIVE Sensitive     PIP/TAZO <=4 SENSITIVE Sensitive     * >=100,000 COLONIES/mL ESCHERICHIA COLI  SARS Coronavirus 2 by RT PCR (hospital order, performed in [facility] lab) Nasopharyngeal Nasopharyngeal Swab     Status: None   Collection Time: 10/24/19  2:28 PM   Specimen: Nasopharyngeal Swab  Result Value Ref Range Status   SARS Coronavirus 2 NEGATIVE NEGATIVE Final    Comment: (NOTE) SARS-CoV-2 target nucleic acids are NOT DETECTED. The SARS-CoV-2 RNA is generally detectable in upper and lower respiratory specimens during the acute phase of infection. The lowest concentration of SARS-CoV-2 viral copies this assay can detect is 250 copies / mL. A negative result does not preclude SARS-CoV-2 infection and should not be used as the sole basis for treatment or other patient management decisions.  A negative result may occur with improper specimen collection / handling, submission of specimen other than nasopharyngeal swab, presence of viral mutation(s) within the areas targeted by this assay, and inadequate number of viral copies (<250 copies / mL). A negative result must be combined with clinical observations, patient history, and epidemiological information. Fact Sheet for Patients:   BoilerBrush.com.cyhttps://www.fda.gov/media/136312/download Fact Sheet for Healthcare Providers: https://pope.com/https://www.fda.gov/media/136313/download This test is not yet approved or cleared  by the [place] FDA and has been authorized for detection and/or diagnosis of SARS-CoV-2 by FDA under an Emergency Use Authorization (EUA).  This EUA will remain in  effect (meaning this test can be used) for the duration of the COVID-19 declaration under Section 564(b)(1) of the Act, 21 U.S.C. section 360bbb-3(b)(1), unless the authorization is terminated or revoked sooner. Performed at [facility], [address] [place], [place] [address]   CULTURE, BLOOD (ROUTINE X 2) w Reflex to ID Panel     Status: None (Preliminary result)   Collection Time: 10/24/19  2:29 PM   Specimen: BLOOD  Result Value Ref Range Status   Specimen Description BLOOD RIGHT RADIAL  Final   Special Requests   Final    BOTTLES DRAWN AEROBIC AND ANAEROBIC Blood Culture adequate volume   Culture   Final    NO GROWTH 2 DAYS Performed at [facility], [address] [place], [place] [address]    Report Status PENDING  Incomplete  CULTURE, BLOOD (ROUTINE X 2) w Reflex to ID Panel     Status: None (Preliminary result)   Collection Time: 10/24/19  3:26 PM   Specimen: BLOOD  Result Value Ref Range Status   Specimen Description BLOOD RIGHT ANTECUBITAL  Final   Special Requests   Final    BOTTLES DRAWN AEROBIC AND ANAEROBIC Blood Culture adequate volume   Culture   Final    NO GROWTH 2 DAYS Performed at [facility], [address] [place], [place] [address]    Report Status PENDING  Incomplete     Time coordinating discharge: Over 30 minutes  SIGNED:   Tresa MooreSudheer B Kerria Sapien, MD  [facility] 10/26/2019, 11:49 AM Pager   If 7PM-7AM, please contact night-coverage

## 2019-10-29 LAB — CULTURE, BLOOD (ROUTINE X 2)
Culture: NO GROWTH
Culture: NO GROWTH
Special Requests: ADEQUATE
Special Requests: ADEQUATE

## 2019-11-03 ENCOUNTER — Ambulatory Visit (INDEPENDENT_AMBULATORY_CARE_PROVIDER_SITE_OTHER): Payer: PPO | Admitting: Internal Medicine

## 2019-11-03 ENCOUNTER — Encounter: Payer: Self-pay | Admitting: Internal Medicine

## 2019-11-03 ENCOUNTER — Other Ambulatory Visit: Payer: Self-pay

## 2019-11-03 DIAGNOSIS — F411 Generalized anxiety disorder: Secondary | ICD-10-CM | POA: Diagnosis not present

## 2019-11-03 DIAGNOSIS — E039 Hypothyroidism, unspecified: Secondary | ICD-10-CM

## 2019-11-03 DIAGNOSIS — I10 Essential (primary) hypertension: Secondary | ICD-10-CM

## 2019-11-03 DIAGNOSIS — N10 Acute tubulo-interstitial nephritis: Secondary | ICD-10-CM

## 2019-11-03 MED ORDER — LEVOTHYROXINE SODIUM 88 MCG PO TABS: 88.0000 ug | ORAL_TABLET | Freq: Every day | ORAL | 3 refills | Status: DC

## 2019-11-03 NOTE — Progress Notes (Signed)
[facility] [place], [place] [address]  Internal MEDICINE  Office Visit Note  Patient Name: Michelle [name]  [id number]  [id number]  Date of Service: 11/04/2019  Transitional care management   Admit date: 10/24/2019 Discharge date: 10/26/2019  Chief Complaint  Patient presents with  . Telephone Assessment    [phone]  . Telephone Screen  . Hospitalization Follow-up    kidney infections   . Hypertension    HPI Pt is here for recent hospital follow up. Pt was hospitalized with dx of acute pyelonephritis, was given zosyn initially, urine cx was positive for E-coli but blood cx is negative    Current Medication: Outpatient Encounter Medications as of 11/03/2019  Medication Sig  . ALPRAZolam (XANAX) 0.25 MG tablet Take 1 tablet (0.25 mg total) by mouth at bedtime as needed. (Patient taking differently: Take 0.125-0.25 mg by mouth at bedtime as needed for sleep. )  . amLODipine (NORVASC) 5 MG tablet Take 1 tablet (5 mg total) by mouth daily.  Marland Kitchen aspirin EC 81 MG tablet Take 81 mg by mouth daily.  . fluticasone (FLONASE) 50 MCG/ACT nasal spray Place 1 spray into both nostrils daily as needed. (Patient taking differently: Place 1 spray into both nostrils daily as needed for allergies or rhinitis. )  . [DISCONTINUED] estradiol (ESTRACE) 0.5 MG tablet Take 1 tablet (0.5 mg total) by mouth daily. (Patient taking differently: Take 0.5 mg by mouth every evening. )  . [DISCONTINUED] ibuprofen (ADVIL) 800 MG tablet Take 1 tablet (800 mg total) by mouth 2 (two) times daily as needed. (Patient taking differently: Take 800 mg by mouth 2 (two) times daily as needed for mild pain or moderate pain. )  . [DISCONTINUED] levothyroxine (SYNTHROID) 88 MCG tablet Take 1 tablet (88 mcg total) by mouth daily before breakfast.   No facility-administered encounter medications on file as of 11/03/2019.    Surgical History: Past Surgical History:  Procedure Laterality Date  . child  birth natural    . diatation      Medical History: Past Medical History:  Diagnosis Date  . Bladder incontinence   . Hypertension   . Hypothyroidism     Family History: Family History  Problem Relation Age of Onset  . Cancer Father   . Hyperlipidemia Father   . Hypertension Father     Social History   Socioeconomic History  . Marital status: Married    Spouse name: Not on file  . Number of children: Not on file  . Years of education: Not on file  . Highest education level: Not on file  Occupational History  . Not on file  Tobacco Use  . Smoking status: Former Smoker    Types: Cigarettes  . Smokeless tobacco: Never Used  Substance and Sexual Activity  . Alcohol use: No  . Drug use: No  . Sexual activity: Not on file  Other Topics Concern  . Not on file  Social History Narrative  . Not on file   Social Determinants of Health   Financial Resource Strain:   . Difficulty of Paying Living Expenses:   Food Insecurity:   . Worried About Charity fundraiser in the Last Year:   . Arboriculturist in the Last Year:   Transportation Needs:   . Film/video editor (Medical):   Marland Kitchen Lack of Transportation (Non-Medical):   Physical Activity:   . Days of Exercise per Week:   . Minutes of Exercise per Session:  Stress:   . Feeling of Stress :   Social Connections:   . Frequency of Communication with Friends and Family:   . Frequency of Social Gatherings with Friends and Family:   . Attends Religious Services:   . Active Member of Clubs or Organizations:   . Attends Banker Meetings:   Marland Kitchen Marital Status:   Intimate Partner Violence:   . Fear of Current or Ex-Partner:   . Emotionally Abused:   Marland Kitchen Physically Abused:   . Sexually Abused:     Review of Systems  Constitutional: Negative for chills, diaphoresis and fatigue.  HENT: Negative for ear pain, postnasal drip and sinus pressure.   Eyes: Negative for photophobia, discharge, redness, itching and  visual disturbance.  Respiratory: Negative for cough, shortness of breath and wheezing.   Cardiovascular: Negative for chest pain, palpitations and leg swelling.  Gastrointestinal: Negative for abdominal pain, constipation, diarrhea, nausea and vomiting.  Genitourinary: Positive for dysuria. Negative for flank pain.  Musculoskeletal: Negative for arthralgias, back pain, gait problem and neck pain.  Skin: Negative for color change.  Allergic/Immunologic: Negative for environmental allergies and food allergies.  Neurological: Negative for dizziness and headaches.  Hematological: Does not bruise/bleed easily.  Psychiatric/Behavioral: Negative for agitation, behavioral problems (depression) and hallucinations.   Vital Signs: BP 138/82   Pulse 73   Ht 5\' 2"  (1.575 m)   Wt 140 lb (63.5 kg)   BMI 25.61 kg/m    Physical Exam No exam is done, pt is in no NAD  Assessment/Plan: 1. Acute pyelonephritis - Finish abx, will follow  - Urinalysis - CULTURE, URINE COMPREHENSIVE, has app with urology   2. Essential hypertension - Monitor bp at home   3. Generalized anxiety disorder - Controlled   General Counseling: thyra yinger understanding of the findings of todays visit and agrees with plan of treatment. I have discussed any further diagnostic evaluation that may be needed or ordered today. We also reviewed her medications today. she has been encouraged to call the office with any questions or concerns that should arise related to todays visit. Counseling: Hypertension Counseling:   The following hypertensive lifestyle modification were recommended and discussed:  1. Limiting alcohol intake to less than 1 oz/day of ethanol:(24 oz of beer or 8 oz of wine or 2 oz of 100-proof whiskey). 2. Take baby ASA 81 mg daily. 3. Importance of regular aerobic exercise and losing weight. 4. Reduce dietary saturated fat and cholesterol intake for overall cardiovascular health. 5. Maintaining  adequate dietary potassium, calcium, and magnesium intake. 6. Regular monitoring of the blood pressure. 7. Reduce sodium intake to less than 100 mmol/day (less than 2.3 gm of sodium or less than 6 gm of sodium choride)    Orders Placed This Encounter  Procedures  . CULTURE, URINE COMPREHENSIVE  . CULTURE, URINE COMPREHENSIVE  . Urinalysis    I have reviewed all medical records from hospital follow up including radiology reports and consults from other physicians. Appropriate follow up diagnostics will be scheduled as needed. Patient/ Family understands the plan of treatment. Time spent 30 minutes.   Dr Alvia Grove, MD Internal Medicine

## 2019-11-05 ENCOUNTER — Other Ambulatory Visit: Payer: Self-pay

## 2019-11-05 ENCOUNTER — Ambulatory Visit: Payer: PPO | Admitting: Urology

## 2019-11-05 ENCOUNTER — Encounter: Payer: Self-pay | Admitting: Urology

## 2019-11-05 VITALS — BP 143/83 | HR 68 | Ht 62.0 in | Wt 140.0 lb

## 2019-11-05 DIAGNOSIS — N390 Urinary tract infection, site not specified: Secondary | ICD-10-CM | POA: Diagnosis not present

## 2019-11-05 DIAGNOSIS — N131 Hydronephrosis with ureteral stricture, not elsewhere classified: Secondary | ICD-10-CM

## 2019-11-05 NOTE — Progress Notes (Signed)
Renal   11/05/19 11:28 AM   Michelle [name] 01-17-1951 [id number]  CC: Right hydroureteronephrosis  HPI: I saw Ms. Rumball in urology clinic today for evaluation of right hydroureteronephrosis.  She is a healthy 69 year old female who was recently hospitalized in early June for right-sided pyelonephritis and urine culture grew > 100 K E. Coli and she had mild leukocytosis to 13.6k, but no fevers.  This was her only episode of pyelonephritis.  She reports she has 1-2 UTIs per year.  A CT scan was performed that showed moderate right hydroureteronephrosis down to the level of the bladder with no obstructing stones.  This was unchanged from her last CT in 2014.  A renal ultrasound in November 2020 also showed right-sided hydronephrosis.  She has not seen a urologist recently.  She reports her only stone history was a right-sided distal ureteral stone treated with shockwave lithotripsy by Dr. Madelin Headings around 2012.  Unfortunately none of these records are available to me.  She reports a dull right sided ache that has been intermittent over the last year, but she feels may be more related to walking more during Covid.  She denies any prior episodes of pyelonephritis except for the one in early June.  She denies any gross hematuria.  She is a non-smoker and has a 5-pack-year smoking history over 30 years ago.  Her surgical history is also notable for hysterectomy and anterior colporrhaphy with Dr. Kenton Kingfisher in 2015.   PMH: Past Medical History:  Diagnosis Date  . Bladder incontinence   . Hypertension   . Hypothyroidism     Surgical History: Past Surgical History:  Procedure Laterality Date  . child birth natural    . diatation      Family History: Family History  Problem Relation Age of Onset  . Cancer Father   . Hyperlipidemia Father   . Hypertension Father     Social History:  reports that she has quit smoking. Her smoking use included cigarettes. She has never used smokeless tobacco. She  reports that she does not drink alcohol and does not use drugs.  Physical Exam: BP (!) 143/83   Pulse 68   Ht 5\' 2"  (1.575 m)   Wt 140 lb (63.5 kg)   BMI 25.61 kg/m    Constitutional:  Alert and oriented, No acute distress. Cardiovascular: No clubbing, cyanosis, or edema. Respiratory: Normal respiratory effort, no increased work of breathing. GI: Abdomen is soft, nontender, nondistended, no abdominal masses GU: No CVA tenderness  Laboratory Data: Reviewed, see HPI  Pertinent Imaging: I have personally reviewed the CT dated 10/24/2019.  Moderate right hydroureteronephrosis down to the level of the UVJ with no ureteral stones noted.  This is stable from CT in 2014 per report.  Small nonobstructing right upper pole stone 2 mm in size.  Assessment & Plan:   In summary, she is a very interesting 69 year old female who had a severe episode of right-sided pyelonephritis on 10/24/2019, and CT showed moderate right hydroureteronephrosis down to the level of the bladder that was unchanged from prior CT in 2014.  Urology was not consulted during this hospitalization.  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  She overall has done very well over the last 5 to 10 years despite this chronic right hydroureteronephrosis, however was hospitalized for pyelonephritis  in June.  We discussed the risk of renal damage, recurrent UTIs, pyelonephritis if we performed observation/surveillance alone.  I recommended a renal scan to evaluate split function and drainage, with likely follow-up right retrograde pyelogram, diagnostic ureteroscopy, possible dilation, possible stent placement.  We also discussed that typically distal ureteral strictures are managed with ureteral reimplant.  We discussed the risks and benefits of  the strategies at length.  -NM Renal scan, follow-up in 2 weeks to discuss results -Discussed basic UTI prevention strategies including cranberry tablet prophylaxis while work-up of right hydronephrosis completed  I spent 60 total minutes on the day of the encounter including pre-visit review of the medical record, face-to-face time with the patient, and post visit ordering of labs/imaging/tests.  Legrand Rams, MD 11/05/2019  [facility] [address] [place], [place] [address] [phone]

## 2019-11-06 LAB — URINALYSIS
Bilirubin, UA: NEGATIVE
Glucose, UA: NEGATIVE
Ketones, UA: NEGATIVE
Leukocytes,UA: NEGATIVE
Nitrite, UA: NEGATIVE
Protein,UA: NEGATIVE
RBC, UA: NEGATIVE
Specific Gravity, UA: 1.017 (ref 1.005–1.030)
Urobilinogen, Ur: 0.2 mg/dL (ref 0.2–1.0)
pH, UA: 6 (ref 5.0–7.5)

## 2019-11-06 LAB — CULTURE, URINE COMPREHENSIVE

## 2019-11-11 ENCOUNTER — Telehealth: Payer: Self-pay

## 2019-11-11 NOTE — Telephone Encounter (Signed)
Confirmed and screened for 11-13-19 ov. 

## 2019-11-12 ENCOUNTER — Other Ambulatory Visit: Payer: Self-pay | Admitting: Nurse Practitioner

## 2019-11-12 DIAGNOSIS — E559 Vitamin D deficiency, unspecified: Secondary | ICD-10-CM | POA: Diagnosis not present

## 2019-11-12 DIAGNOSIS — Z0001 Encounter for general adult medical examination with abnormal findings: Secondary | ICD-10-CM | POA: Diagnosis not present

## 2019-11-12 DIAGNOSIS — Z1159 Encounter for screening for other viral diseases: Secondary | ICD-10-CM | POA: Diagnosis not present

## 2019-11-12 DIAGNOSIS — I10 Essential (primary) hypertension: Secondary | ICD-10-CM | POA: Diagnosis not present

## 2019-11-12 DIAGNOSIS — E039 Hypothyroidism, unspecified: Secondary | ICD-10-CM | POA: Diagnosis not present

## 2019-11-13 ENCOUNTER — Other Ambulatory Visit: Payer: Self-pay

## 2019-11-13 ENCOUNTER — Ambulatory Visit (INDEPENDENT_AMBULATORY_CARE_PROVIDER_SITE_OTHER): Payer: PPO | Admitting: Nurse Practitioner

## 2019-11-13 ENCOUNTER — Encounter: Payer: Self-pay | Admitting: Nurse Practitioner

## 2019-11-13 VITALS — BP 125/73 | HR 86 | Temp 97.5°F | Resp 16 | Ht 62.0 in | Wt 143.0 lb

## 2019-11-13 DIAGNOSIS — J0140 Acute pansinusitis, unspecified: Secondary | ICD-10-CM

## 2019-11-13 DIAGNOSIS — E039 Hypothyroidism, unspecified: Secondary | ICD-10-CM | POA: Diagnosis not present

## 2019-11-13 DIAGNOSIS — F411 Generalized anxiety disorder: Secondary | ICD-10-CM | POA: Diagnosis not present

## 2019-11-13 DIAGNOSIS — N10 Acute tubulo-interstitial nephritis: Secondary | ICD-10-CM

## 2019-11-13 DIAGNOSIS — I10 Essential (primary) hypertension: Secondary | ICD-10-CM

## 2019-11-13 LAB — LIPID PANEL WITH LDL/HDL RATIO
Cholesterol, Total: 195 mg/dL (ref 100–199)
HDL: 46 mg/dL (ref >39)
LDL Chol Calc (NIH): 125 mg/dL — ABNORMAL HIGH (ref 0–99)
LDL/HDL Ratio: 2.7 ratio (ref 0.0–3.2)
Triglycerides: 136 mg/dL (ref 0–149)
VLDL Cholesterol Cal: 24 mg/dL (ref 5–40)

## 2019-11-13 LAB — CBC
Hematocrit: 43.1 % (ref 34.0–46.6)
Hemoglobin: 14.4 g/dL (ref 11.1–15.9)
MCH: 28.7 pg (ref 26.6–33.0)
MCHC: 33.4 g/dL (ref 31.5–35.7)
MCV: 86 fL (ref 79–97)
Platelets: 235 10*3/uL (ref 150–450)
RBC: 5.01 x10E6/uL (ref 3.77–5.28)
RDW: 13.6 % (ref 11.7–15.4)
WBC: 7.3 10*3/uL (ref 3.4–10.8)

## 2019-11-13 LAB — T4, FREE: Free T4: 1.37 ng/dL (ref 0.82–1.77)

## 2019-11-13 LAB — COMPREHENSIVE METABOLIC PANEL
ALT: 18 IU/L (ref 0–32)
AST: 17 IU/L (ref 0–40)
Albumin/Globulin Ratio: 1.4 (ref 1.2–2.2)
Albumin: 4.3 g/dL (ref 3.7–4.7)
Alkaline Phosphatase: 102 IU/L (ref 48–121)
BUN/Creatinine Ratio: 15 (ref 12–28)
BUN: 11 mg/dL (ref 8–27)
Bilirubin Total: 0.5 mg/dL (ref 0.0–1.2)
CO2: 25 mmol/L (ref 20–29)
Calcium: 9.6 mg/dL (ref 8.7–10.3)
Chloride: 101 mmol/L (ref 96–106)
Creatinine, Ser: 0.72 mg/dL (ref 0.57–1.00)
GFR calc Af Amer: 97 mL/min/{1.73_m2} (ref >59)
GFR calc non Af Amer: 85 mL/min/{1.73_m2} (ref >59)
Globulin, Total: 3 g/dL (ref 1.5–4.5)
Glucose: 88 mg/dL (ref 65–99)
Potassium: 3.5 mmol/L (ref 3.5–5.2)
Sodium: 139 mmol/L (ref 134–144)
Total Protein: 7.3 g/dL (ref 6.0–8.5)

## 2019-11-13 LAB — TSH: TSH: 2.35 u[IU]/mL (ref 0.450–4.500)

## 2019-11-13 LAB — VITAMIN D 25 HYDROXY (VIT D DEFICIENCY, FRACTURES): Vit D, 25-Hydroxy: 26.8 ng/mL — ABNORMAL LOW (ref 30.0–100.0)

## 2019-11-13 LAB — HEPATITIS C ANTIBODY (REFLEX): HCV Ab: <0.1 s/co ratio (ref 0.0–0.9)

## 2019-11-13 LAB — HCV COMMENT:

## 2019-11-13 MED ORDER — AZITHROMYCIN 250 MG PO TABS: ORAL_TABLET | ORAL | 0 refills | Status: DC

## 2019-11-13 MED ORDER — LEVOTHYROXINE SODIUM 88 MCG PO TABS: 88.0000 ug | ORAL_TABLET | Freq: Every day | ORAL | 3 refills | Status: DC

## 2019-11-13 NOTE — Progress Notes (Signed)
[facility] [address] [place], [place] [address]  Internal MEDICINE  Office Visit Note  Patient Name: Michelle [name]  [id number]  [id number]  Date of Service: 11/23/2019  Chief Complaint  Patient presents with   Follow-up    Repeated urinalysis due to presence of hematuria   Hypertension    The patient is here for routine follow up.  - blood pressure well managed. -hospitalized earlier this month due to bilateral pyelonephritis. Was referred to urology prior to hospitalization due to chronic hematuria. Does have imaging study to evaluate flow through the kidneys. She continues to have dull ache in the lower right side of the back. The pain is constant and she can never feel completely comfortable since she had this infection - does have scratchy throat today. This started yesterday. She has sinus congestion and pressure.  -had routine, fasting labs done yesterday. Results show very mild elevation of LDL with normal lipid panel. She also has mild vitamin d deficiency. All other labs were good.       Current Medication: Outpatient Encounter Medications as of 11/13/2019  Medication Sig   ALPRAZolam (XANAX) 0.25 MG tablet Take 1 tablet (0.25 mg total) by mouth at bedtime as needed.   amLODipine (NORVASC) 5 MG tablet Take 1 tablet (5 mg total) by mouth daily.   aspirin EC 81 MG tablet Take 81 mg by mouth daily.   fluticasone (FLONASE) 50 MCG/ACT nasal spray Place 1 spray into both nostrils daily as needed.   levothyroxine (SYNTHROID) 88 MCG tablet Take 1 tablet (88 mcg total) by mouth daily before breakfast.   [DISCONTINUED] levothyroxine (SYNTHROID) 88 MCG tablet Take 1 tablet (88 mcg total) by mouth daily before breakfast.   [DISCONTINUED] azithromycin (ZITHROMAX) 250 MG tablet z-pack - take as directed for 5 days   No facility-administered encounter medications on file as of 11/13/2019.    Surgical History: Past Surgical History:  Procedure Laterality  Date   child birth natural     diatation      Medical History: Past Medical History:  Diagnosis Date   Bladder incontinence    Hypertension    Hypothyroidism     Family History: Family History  Problem Relation Age of Onset   Cancer Father    Hyperlipidemia Father    Hypertension Father     Social History   Socioeconomic History   Marital status: Married    Spouse name: Not on file   Number of children: Not on file   Years of education: Not on file   Highest education level: Not on file  Occupational History   Not on file  Tobacco Use   Smoking status: Former Smoker    Types: Cigarettes   Smokeless tobacco: Never Used  Substance and Sexual Activity   Alcohol use: No   Drug use: No   Sexual activity: Not on file  Other Topics Concern   Not on file  Social History Narrative   Not on file   Social Determinants of Health   Financial Resource Strain:    Difficulty of Paying Living Expenses:   Food Insecurity:    Worried About Programme researcher, broadcasting/film/video in the Last Year:    Barista in the Last Year:   Transportation Needs:    Freight forwarder (Medical):    Lack of Transportation (Non-Medical):   Physical Activity:    Days of Exercise per Week:    Minutes of Exercise per Session:  Stress:    Feeling of Stress :   Social Connections:    Frequency of Communication with Friends and Family:    Frequency of Social Gatherings with Friends and Family:    Attends Religious Services:    Active Member of Clubs or Organizations:    Attends Engineer, structural:    Marital Status:   Intimate Partner Violence:    Fear of Current or Ex-Partner:    Emotionally Abused:    Physically Abused:    Sexually Abused:       Review of Systems  Constitutional: Positive for fatigue. Negative for activity change, chills and unexpected weight change.  HENT: Positive for congestion. Negative for postnasal drip, rhinorrhea,  sneezing and sore throat.        Scratchy throat.   Respiratory: Negative for cough, chest tightness, shortness of breath and wheezing.   Cardiovascular: Negative for chest pain and palpitations.       Blood pressure stable.   Gastrointestinal: Negative for abdominal pain, constipation, diarrhea, nausea and vomiting.  Endocrine: Negative for cold intolerance, heat intolerance, polydipsia and polyuria.  Genitourinary: Negative for frequency, hematuria and urgency.       Recently hospitalized for bilateral pyelonephritis. Now seeing urology for this.   Musculoskeletal: Negative for arthralgias, back pain, joint swelling and neck pain.  Skin: Negative for rash.  Allergic/Immunologic: Negative for environmental allergies.  Neurological: Negative for dizziness, tremors, numbness and headaches.  Hematological: Negative for adenopathy. Does not bruise/bleed easily.  Psychiatric/Behavioral: Positive for sleep disturbance. Negative for behavioral problems (Depression) and suicidal ideas. The patient is nervous/anxious.    Today's Vitals   11/13/19 1435  BP: 125/73  Pulse: 86  Resp: 16  Temp: (!) 97.5 F (36.4 C)  SpO2: 96%  Weight: 143 lb (64.9 kg)  Height: 5\' 2"  (1.575 m)   Body mass index is 26.16 kg/m.  Physical Exam Vitals and nursing note reviewed.  Constitutional:      General: She is not in acute distress.    Appearance: Normal appearance. She is well-developed. She is not diaphoretic.  HENT:     Head: Normocephalic and atraumatic.     Right Ear: Tympanic membrane is erythematous. Tympanic membrane is not bulging.     Left Ear: Tympanic membrane is erythematous. Tympanic membrane is not bulging.     Nose: Congestion present.     Right Sinus: Maxillary sinus tenderness and frontal sinus tenderness present.     Left Sinus: Maxillary sinus tenderness and frontal sinus tenderness present.     Mouth/Throat:     Pharynx: Posterior oropharyngeal erythema present. No oropharyngeal  exudate.  Eyes:     Pupils: Pupils are equal, round, and reactive to light.  Neck:     Thyroid: No thyromegaly.     Vascular: No carotid bruit or JVD.     Trachea: No tracheal deviation.  Cardiovascular:     Rate and Rhythm: Normal rate and regular rhythm.     Heart sounds: Normal heart sounds. No murmur heard.  No friction rub. No gallop.   Pulmonary:     Effort: Pulmonary effort is normal. No respiratory distress.     Breath sounds: Normal breath sounds. No wheezing or rales.  Chest:     Chest wall: No tenderness.  Abdominal:     General: Bowel sounds are normal.     Palpations: Abdomen is soft.     Tenderness: There is no abdominal tenderness.  Musculoskeletal:  General: Normal range of motion.     Cervical back: Normal range of motion and neck supple.  Lymphadenopathy:     Cervical: No cervical adenopathy.  Skin:    General: Skin is warm and dry.  Neurological:     Mental Status: She is alert and oriented to person, place, and time.     Cranial Nerves: No cranial nerve deficit.  Psychiatric:        Mood and Affect: Mood normal.        Behavior: Behavior normal.        Thought Content: Thought content normal.        Judgment: Judgment normal.    Assessment/Plan: 1. Acute non-recurrent pansinusitis Start z-pack. Take as directed for 5 days. Rest and increase fluids. Recommend she gargle with warm salt water as needed to improve sore throat.   2. Acquired hypothyroidism Thyroid panel stable. Continue levothyroxine as prescribed. Refills provided today.  - levothyroxine (SYNTHROID) 88 MCG tablet; Take 1 tablet (88 mcg total) by mouth daily before breakfast.  Dispense: 30 tablet; Refill: 3  3. Acute pyelonephritis Essentially resolved. Patient now seeing urology for continued evaluation and treatment.   4. Essential hypertension Stable. Continue bp medication as prescribed   5. Generalized anxiety disorder May take alprazolam 0.25mg  at bedtime as needed.    General Counseling: ellenie salome understanding of the findings of todays visit and agrees with plan of treatment. I have discussed any further diagnostic evaluation that may be needed or ordered today. We also reviewed her medications today. she has been encouraged to call the office with any questions or concerns that should arise related to todays visit.  This patient was seen by Leretha Pol FNP Collaboration with Dr Lavera Guise as a part of collaborative care agreement  Meds ordered this encounter  Medications   DISCONTD: azithromycin (ZITHROMAX) 250 MG tablet    Sig: z-pack - take as directed for 5 days    Dispense:  6 tablet    Refill:  0    Order Specific Question:   Supervising Provider    Answer:   Lavera Guise [[id number]]   levothyroxine (SYNTHROID) 88 MCG tablet    Sig: Take 1 tablet (88 mcg total) by mouth daily before breakfast.    Dispense:  30 tablet    Refill:  3    Please note reduced dose.    Order Specific Question:   Supervising Provider    Answer:   Lavera Guise [[id number]]    Total time spent: 30 Minutes   Time spent includes review of chart, medications, test results, and follow up plan with the patient.      Dr Lavera Guise Internal medicine

## 2019-11-17 ENCOUNTER — Other Ambulatory Visit: Payer: Self-pay | Admitting: Nurse Practitioner

## 2019-11-17 ENCOUNTER — Telehealth: Payer: Self-pay

## 2019-11-17 DIAGNOSIS — R059 Cough, unspecified: Secondary | ICD-10-CM

## 2019-11-17 DIAGNOSIS — J0140 Acute pansinusitis, unspecified: Secondary | ICD-10-CM

## 2019-11-17 MED ORDER — CEFUROXIME AXETIL 500 MG PO TABS: 500.0000 mg | ORAL_TABLET | Freq: Two times a day (BID) | ORAL | 0 refills | Status: DC

## 2019-11-17 MED ORDER — PROMETHAZINE-CODEINE 6.25-10 MG/5ML PO SYRP: 5.0000 mL | ORAL_SOLUTION | Freq: Four times a day (QID) | ORAL | 0 refills | Status: DC | PRN

## 2019-11-17 NOTE — Progress Notes (Signed)
Patient still having sinusitis symptoms after treatment with z-pack. Start ceftin. Take twice daily for 10 days. Rest and increase fluids. Take over-the-counter medications as needed and as indicated to improve symptoms.

## 2019-11-17 NOTE — Progress Notes (Signed)
Sent promethazine/codeine cough syrup. She can take 5mls up to three times daily as needed for cough. She should not drive or do anything after taking this cough suppressant as it may make her tired and/or dizzy.

## 2019-11-17 NOTE — Telephone Encounter (Signed)
PT NOTIFIED  

## 2019-11-17 NOTE — Telephone Encounter (Signed)
Sent promethazine/codeine cough syrup. She can take up to three times daily as needed for cough. She should not drive or do anything after taking this cough suppressant as it may make her tired and/or dizzy.

## 2019-11-17 NOTE — Telephone Encounter (Signed)
Patient still having sinusitis symptoms after treatment with z-pack. Start ceftin. Take twice daily for 10 days. Rest and increase fluids. Take over-the-counter medications as needed and as indicated to improve symptoms.  

## 2019-11-17 NOTE — Progress Notes (Signed)
Only mild vitamin d deficiency. Other las good. Discuss with patient at next follow up visit .

## 2019-11-23 DIAGNOSIS — J0140 Acute pansinusitis, unspecified: Secondary | ICD-10-CM | POA: Insufficient documentation

## 2019-12-03 ENCOUNTER — Telehealth: Payer: Self-pay

## 2019-12-03 ENCOUNTER — Ambulatory Visit: Payer: Self-pay | Admitting: Urology

## 2019-12-03 NOTE — Telephone Encounter (Signed)
Called pt informed her that we are unable to see her for her results appt as she has not had imaging done. Advised pt I would reach out to imaging department today and ask them to call her. Pt gave verbal understanding.

## 2019-12-03 NOTE — Telephone Encounter (Signed)
They have been calling this patient since 11-05-19 to schedule and she has not returned their calls. The patient needs to call [phone] to schedule. They have tried to call her several times and left messages.  Thanks, Marcelino Duster

## 2019-12-04 ENCOUNTER — Ambulatory Visit (INDEPENDENT_AMBULATORY_CARE_PROVIDER_SITE_OTHER): Payer: PPO | Admitting: Nurse Practitioner

## 2019-12-04 ENCOUNTER — Other Ambulatory Visit: Payer: Self-pay

## 2019-12-04 VITALS — BP 129/67 | HR 85 | Temp 97.7°F | Resp 16 | Ht 62.0 in | Wt 145.4 lb

## 2019-12-04 DIAGNOSIS — R319 Hematuria, unspecified: Secondary | ICD-10-CM

## 2019-12-04 DIAGNOSIS — R300 Dysuria: Secondary | ICD-10-CM

## 2019-12-04 DIAGNOSIS — I10 Essential (primary) hypertension: Secondary | ICD-10-CM

## 2019-12-04 DIAGNOSIS — N390 Urinary tract infection, site not specified: Secondary | ICD-10-CM | POA: Diagnosis not present

## 2019-12-04 LAB — POCT URINALYSIS DIPSTICK
Glucose, UA: NEGATIVE
Nitrite, UA: POSITIVE
Protein, UA: POSITIVE — AB
Spec Grav, UA: 1.02 (ref 1.010–1.025)
Urobilinogen, UA: 0.2 E.U./dL
pH, UA: 5 (ref 5.0–8.0)

## 2019-12-04 MED ORDER — NITROFURANTOIN MONOHYD MACRO 100 MG PO CAPS: 100.0000 mg | ORAL_CAPSULE | Freq: Two times a day (BID) | ORAL | 0 refills | Status: DC

## 2019-12-04 MED ORDER — PHENAZOPYRIDINE HCL 200 MG PO TABS: 200.0000 mg | ORAL_TABLET | Freq: Three times a day (TID) | ORAL | 0 refills | Status: DC | PRN

## 2019-12-04 NOTE — Progress Notes (Signed)
[facility] [address] [place], [place] [address]  Internal MEDICINE  Office Visit Note  Patient Name: Michelle [name]  [id number]  [id number]  Date of Service: 12/17/2019   Pt is here for a sick visit.  Chief Complaint  Patient presents with  . Urinary Tract Infection    pain and burning; hard to walk     The patient presents for acute visit. She is having bilateral flank pain and burning with urination. Symptoms have been going on for a few days and gradually getting worse. She states that she has had some nausea without vomiting. She denies abdominal pain or fever. She has had no cramping or diarrhea. She does have history of chronic UTI with hematuria. She is seeing a urologist, but it is very difficult to get acute, same day appointments with that provider.        Current Medication:  Outpatient Encounter Medications as of 12/04/2019  Medication Sig  . ALPRAZolam (XANAX) 0.25 MG tablet Take 1 tablet (0.25 mg total) by mouth at bedtime as needed.  Marland Kitchen amLODipine (NORVASC) 5 MG tablet Take 1 tablet (5 mg total) by mouth daily.  Marland Kitchen aspirin EC 81 MG tablet Take 81 mg by mouth daily.  . cefUROXime (CEFTIN) 500 MG tablet Take 1 tablet (500 mg total) by mouth 2 (two) times daily with a meal.  . fluticasone (FLONASE) 50 MCG/ACT nasal spray Place 1 spray into both nostrils daily as needed.  Marland Kitchen levothyroxine (SYNTHROID) 88 MCG tablet Take 1 tablet (88 mcg total) by mouth daily before breakfast.  . promethazine-codeine (PHENERGAN WITH CODEINE) 6.25-10 MG/5ML syrup Take 5 mLs by mouth every 6 (six) hours as needed for cough.  . nitrofurantoin, macrocrystal-monohydrate, (MACROBID) 100 MG capsule Take 1 capsule (100 mg total) by mouth 2 (two) times daily.  . phenazopyridine (PYRIDIUM) 200 MG tablet Take 1 tablet (200 mg total) by mouth 3 (three) times daily as needed for pain.   No facility-administered encounter medications on file as of 12/04/2019.      Medical  History: Past Medical History:  Diagnosis Date  . Bladder incontinence   . Hypertension   . Hypothyroidism     Today's Vitals   12/04/19 1500  BP: 129/67  Pulse: 85  Resp: 16  Temp: 97.7 F (36.5 C)  SpO2: 97%  Weight: 145 lb 6.4 oz (66 kg)  Height: 5\' 2"  (1.575 m)   Body mass index is 26.59 kg/m.  Review of Systems  Constitutional: Positive for fatigue. Negative for activity change, chills and unexpected weight change.  HENT: Negative for congestion, postnasal drip, rhinorrhea, sneezing and sore throat.   Respiratory: Negative for cough, chest tightness, shortness of breath and wheezing.   Cardiovascular: Negative for chest pain and palpitations.  Gastrointestinal: Negative for abdominal pain, constipation, diarrhea, nausea and vomiting.  Genitourinary: Positive for dysuria, flank pain, hematuria and urgency. Negative for frequency.  Musculoskeletal: Positive for back pain. Negative for arthralgias, joint swelling and neck pain.  Skin: Negative for rash.  Neurological: Negative.  Negative for tremors and numbness.  Hematological: Negative for adenopathy. Does not bruise/bleed easily.  Psychiatric/Behavioral: Negative for behavioral problems (Depression), sleep disturbance and suicidal ideas. The patient is not nervous/anxious.     Physical Exam Vitals and nursing note reviewed.  Constitutional:      General: She is not in acute distress.    Appearance: Normal appearance. She is well-developed. She is not diaphoretic.  HENT:     Head: Normocephalic and atraumatic.  Nose: Nose normal.     Mouth/Throat:     Pharynx: No oropharyngeal exudate.  Eyes:     Pupils: Pupils are equal, round, and reactive to light.  Neck:     Thyroid: No thyromegaly.     Vascular: No JVD.     Trachea: No tracheal deviation.  Cardiovascular:     Rate and Rhythm: Normal rate and regular rhythm.     Heart sounds: Normal heart sounds. No murmur heard.  No friction rub. No gallop.    Pulmonary:     Effort: Pulmonary effort is normal. No respiratory distress.     Breath sounds: Normal breath sounds. No wheezing or rales.  Chest:     Chest wall: No tenderness.  Abdominal:     General: Bowel sounds are normal.     Palpations: Abdomen is soft.     Tenderness: There is no abdominal tenderness.  Genitourinary:    Comments: Urine sample is positive for large WBC and large blood. There is protein and nitrites present in the urine.  Musculoskeletal:        General: Normal range of motion.     Cervical back: Normal range of motion and neck supple.  Lymphadenopathy:     Cervical: No cervical adenopathy.  Skin:    General: Skin is warm and dry.  Neurological:     Mental Status: She is alert and oriented to person, place, and time.     Cranial Nerves: No cranial nerve deficit.  Psychiatric:        Behavior: Behavior normal.        Thought Content: Thought content normal.        Judgment: Judgment normal.    Assessment/Plan: 1. Urinary tract infection with hematuria, site unspecified Start macrobid 100mg  twice daily for next 10 days. Send urine for culture and sensitivity and adjust antibiotics as indicataed.  - nitrofurantoin, macrocrystal-monohydrate, (MACROBID) 100 MG capsule; Take 1 capsule (100 mg total) by mouth 2 (two) times daily.  Dispense: 20 capsule; Refill: 0  2. Dysuria May take pyridium up to three times daily as needed for bladder pain and spasms.  - POCT Urinalysis Dipstick - CULTURE, URINE COMPREHENSIVE - phenazopyridine (PYRIDIUM) 200 MG tablet; Take 1 tablet (200 mg total) by mouth 3 (three) times daily as needed for pain.  Dispense: 10 tablet; Refill: 0  3. Essential hypertension Stable. Continue bp medication as prescribed.   General Counseling: Michelle [name] understanding of the findings of todays visit and agrees with plan of treatment. I have discussed any further diagnostic evaluation that may be needed or ordered today. We also reviewed  her medications today. she has been encouraged to call the office with any questions or concerns that should arise related to todays visit.    Counseling:  This patient was seen by Alvia Grove FNP Collaboration with Dr Vincent Gros as a part of collaborative care agreement  Orders Placed This Encounter  Procedures  . CULTURE, URINE COMPREHENSIVE  . POCT Urinalysis Dipstick    Meds ordered this encounter  Medications  . nitrofurantoin, macrocrystal-monohydrate, (MACROBID) 100 MG capsule    Sig: Take 1 capsule (100 mg total) by mouth 2 (two) times daily.    Dispense:  20 capsule    Refill:  0    Order Specific Question:   Supervising Provider    Answer:   [name] [[id number]]  . phenazopyridine (PYRIDIUM) 200 MG tablet    Sig: Take 1 tablet (200 mg total) by  mouth 3 (three) times daily as needed for pain.    Dispense:  10 tablet    Refill:  0    Order Specific Question:   Supervising Provider    Answer:   [name] [[id number]]    Time spent: 25 Minutes

## 2019-12-04 NOTE — Telephone Encounter (Signed)
Called pt no answer. LM for pt informing her of the need to all imaging in order to schedule.

## 2019-12-05 NOTE — Telephone Encounter (Signed)
Patient returned call, informed patient of message. Voiced understanding.

## 2019-12-07 NOTE — Progress Notes (Signed)
Started patient on macrobid 100mg  twice daily.

## 2019-12-08 LAB — CULTURE, URINE COMPREHENSIVE

## 2019-12-15 ENCOUNTER — Encounter
Admission: RE | Admit: 2019-12-15 | Discharge: 2019-12-15 | Disposition: A | Discharge status: Home / Self Care | Payer: PPO | Source: Ambulatory Visit | Attending: Urology | Admitting: Urology

## 2019-12-15 ENCOUNTER — Other Ambulatory Visit: Payer: Self-pay

## 2019-12-15 DIAGNOSIS — N131 Hydronephrosis with ureteral stricture, not elsewhere classified: Secondary | ICD-10-CM | POA: Diagnosis not present

## 2019-12-15 DIAGNOSIS — N1330 Unspecified hydronephrosis: Secondary | ICD-10-CM | POA: Diagnosis not present

## 2019-12-15 DIAGNOSIS — N2889 Other specified disorders of kidney and ureter: Secondary | ICD-10-CM | POA: Diagnosis not present

## 2019-12-15 DIAGNOSIS — N2882 Megaloureter: Secondary | ICD-10-CM | POA: Diagnosis not present

## 2019-12-15 MED ORDER — TECHNETIUM TC 99M MERTIATIDE
5.0020 | Freq: Once | INTRAVENOUS | Status: AC | PRN
  Administered 2019-12-15: 5.002 via INTRAVENOUS

## 2019-12-15 MED ORDER — FUROSEMIDE 10 MG/ML IJ SOLN
33.0000 mg | Freq: Once | INTRAMUSCULAR | Status: AC
  Administered 2019-12-15: 33 mg via INTRAVENOUS
  Filled 2019-12-15 (×2): qty 3.3

## 2019-12-17 ENCOUNTER — Encounter: Payer: Self-pay | Admitting: Nurse Practitioner

## 2019-12-22 ENCOUNTER — Other Ambulatory Visit: Payer: Self-pay

## 2019-12-22 DIAGNOSIS — I10 Essential (primary) hypertension: Secondary | ICD-10-CM

## 2019-12-22 MED ORDER — AMLODIPINE BESYLATE 5 MG PO TABS: 5.0000 mg | ORAL_TABLET | Freq: Every day | ORAL | 3 refills | Status: DC

## 2019-12-23 ENCOUNTER — Other Ambulatory Visit: Payer: Self-pay

## 2019-12-24 ENCOUNTER — Ambulatory Visit (INDEPENDENT_AMBULATORY_CARE_PROVIDER_SITE_OTHER): Payer: PPO | Admitting: Urology

## 2019-12-24 ENCOUNTER — Encounter: Payer: Self-pay | Admitting: Urology

## 2019-12-24 ENCOUNTER — Other Ambulatory Visit: Payer: Self-pay

## 2019-12-24 VITALS — BP 130/72 | HR 69 | Ht 62.0 in | Wt 140.0 lb

## 2019-12-24 DIAGNOSIS — N131 Hydronephrosis with ureteral stricture, not elsewhere classified: Secondary | ICD-10-CM

## 2019-12-24 NOTE — Patient Instructions (Signed)
Call if you are having increased UTIs, or severe right sided back pain and fevers

## 2019-12-24 NOTE — Progress Notes (Signed)
   12/24/2019 1:15 PM   Delmy Holdren Loyola Jul 14, 1950 [id number]  Reason for visit: Follow up chronic right hydronephrosis  HPI: I saw Ms. Rumball back in urology clinic for follow-up of chronic right hydronephrosis.  Briefly, she is a 69 year old female who has had moderate right hydroureteronephrosis down to the bladder since at least 2012, likely secondary to a stone episode at that time.  She has 1-2 UTIs per year, and has had one episode of right-sided pyelonephritis.  Her renal function is stable and normal with creatinine of 0.72, EGFR greater than 60.  She denies any significant right-sided flank pain.  She underwent a NM renal scan for better evaluation of her chronic right-sided hydronephrosis, and this showed 61% function on the left, and 39% function on the right, with no evidence of high-grade obstructive uropathy on either side.  We discussed options at length including observation with close follow-up in 6 months, or diagnostic right ureteroscopy, possible balloon dilation and ureteral stent placement, or even ureteral reimplant.  She would like to avoid any intervention if possible.  In the setting of the stability of the hydronephrosis for over 10 years and only one episode of pyelonephritis, I think ongoing observation is not unreasonable.  We discussed return precautions at length including right-sided flank pain or recurrent episodes of right-sided pyelonephritis or recurrent UTIs.  Follow-up in 6 months for symptom check, sooner if recurrent episodes of right-sided pyelonephritis  I spent 30 total minutes on the day of the encounter including pre-visit review of the medical record, face-to-face time with the patient, and post visit ordering of labs/imaging/tests.  Billey Co, [facility] [address], [place], [place] [address] [phone]

## 2020-02-23 ENCOUNTER — Other Ambulatory Visit: Payer: Self-pay

## 2020-02-23 DIAGNOSIS — I10 Essential (primary) hypertension: Secondary | ICD-10-CM

## 2020-02-23 MED ORDER — AMLODIPINE BESYLATE 5 MG PO TABS: 5.0000 mg | ORAL_TABLET | Freq: Every day | ORAL | 3 refills | Status: DC

## 2020-02-26 ENCOUNTER — Ambulatory Visit (INDEPENDENT_AMBULATORY_CARE_PROVIDER_SITE_OTHER): Payer: PPO | Admitting: Nurse Practitioner

## 2020-02-26 VITALS — BP 132/78 | HR 75 | Temp 97.5°F | Resp 16 | Ht 62.0 in | Wt 143.0 lb

## 2020-02-26 DIAGNOSIS — I700 Atherosclerosis of aorta: Secondary | ICD-10-CM

## 2020-02-26 DIAGNOSIS — Z1231 Encounter for screening mammogram for malignant neoplasm of breast: Secondary | ICD-10-CM

## 2020-02-26 DIAGNOSIS — Z0001 Encounter for general adult medical examination with abnormal findings: Secondary | ICD-10-CM

## 2020-02-26 DIAGNOSIS — F411 Generalized anxiety disorder: Secondary | ICD-10-CM | POA: Diagnosis not present

## 2020-02-26 DIAGNOSIS — J0140 Acute pansinusitis, unspecified: Secondary | ICD-10-CM

## 2020-02-26 DIAGNOSIS — R300 Dysuria: Secondary | ICD-10-CM | POA: Diagnosis not present

## 2020-02-26 DIAGNOSIS — Z79899 Other long term (current) drug therapy: Secondary | ICD-10-CM

## 2020-02-26 DIAGNOSIS — N8110 Cystocele, unspecified: Secondary | ICD-10-CM

## 2020-02-26 DIAGNOSIS — E039 Hypothyroidism, unspecified: Secondary | ICD-10-CM | POA: Diagnosis not present

## 2020-02-26 LAB — POCT URINE DRUG SCREEN
POC Amphetamine UR: NOT DETECTED
POC BENZODIAZEPINES UR: POSITIVE — AB
POC Barbiturate UR: NOT DETECTED
POC Cocaine UR: NOT DETECTED
POC Ecstasy UR: NOT DETECTED
POC Marijuana UR: NOT DETECTED
POC Methadone UR: NOT DETECTED
POC Methamphetamine UR: NOT DETECTED
POC Opiate Ur: NOT DETECTED
POC Oxycodone UR: NOT DETECTED
POC PHENCYCLIDINE UR: NOT DETECTED
POC TRICYCLICS UR: NOT DETECTED

## 2020-02-26 MED ORDER — AZITHROMYCIN 250 MG PO TABS: ORAL_TABLET | ORAL | 0 refills | Status: DC

## 2020-02-26 MED ORDER — LEVOTHYROXINE SODIUM 88 MCG PO TABS: 88.0000 ug | ORAL_TABLET | Freq: Every day | ORAL | 3 refills | Status: DC

## 2020-02-26 NOTE — Addendum Note (Signed)
Addended by: Vincent Gros on: 02/26/2020 11:06 AM   Modules accepted: Orders

## 2020-02-26 NOTE — Progress Notes (Addendum)
[facility] [address] [place], [place] [address]  Internal MEDICINE  Office Visit Note  Patient Name: Michelle [name]  [id number]  [id number]  Date of Service: 03/17/2020   Pt is here for routine health maintenance examination  Chief Complaint  Patient presents with  . Follow-up  . Sore Throat  . Hypertension  . other    controlled substance form given to PT  . Quality Metric Gaps    tetnaus,flu     The patient is here for health maintenance exam. States that she is having problems with her bladder. States that her bladder has "dropped" again. She did have surgery to repair the bladder about 10 years ago. Was repaired by Dr. Tiburcio Pea. Feels like her bladder fell right back after about a year. She states that this is getting worse. She would like to see a female GYN provider for further evaluation and discussion of options to repair this.  The patient is c/o nasal congestion and sore throat. She denies fever. She denies body aches or chills. Symptoms started yesterday and have gradually gotten worse.  The patient does take a low dose alprazolam some evenings to help her relax, settle her mind, and help her rest well. She will go several evenings without taking it, then take it for several nights. She states that she is due to have a refill for this today.    Current Medication: Outpatient Encounter Medications as of 02/26/2020  Medication Sig  . ALPRAZolam (XANAX) 0.25 MG tablet Take 1 tablet (0.25 mg total) by mouth at bedtime as needed.  Marland Kitchen amLODipine (NORVASC) 5 MG tablet Take 1 tablet (5 mg total) by mouth daily.  Marland Kitchen aspirin EC 81 MG tablet Take 81 mg by mouth daily.  . fluticasone (FLONASE) 50 MCG/ACT nasal spray Place 1 spray into both nostrils daily as needed.  Marland Kitchen levothyroxine (SYNTHROID) 88 MCG tablet Take 1 tablet (88 mcg total) by mouth daily before breakfast.  . [DISCONTINUED] levothyroxine (SYNTHROID) 88 MCG tablet Take 1 tablet (88 mcg total) by mouth  daily before breakfast.  . azithromycin (ZITHROMAX) 250 MG tablet z-pack - take as directed for 5 days   No facility-administered encounter medications on file as of 02/26/2020.    Surgical History: Past Surgical History:  Procedure Laterality Date  . child birth natural    . diatation      Medical History: Past Medical History:  Diagnosis Date  . Bladder incontinence   . Hypertension   . Hypothyroidism     Family History: Family History  Problem Relation Age of Onset  . Cancer Father   . Hyperlipidemia Father   . Hypertension Father       Review of Systems  Constitutional: Negative for chills, fatigue and unexpected weight change.  HENT: Positive for congestion, postnasal drip, rhinorrhea, sinus pressure and sore throat. Negative for sneezing.   Respiratory: Positive for cough. Negative for chest tightness, shortness of breath and wheezing.   Cardiovascular: Negative for chest pain and palpitations.  Gastrointestinal: Negative for abdominal pain, constipation, diarrhea, nausea and vomiting.  Endocrine: Negative for cold intolerance, heat intolerance, polydipsia and polyuria.  Genitourinary: Positive for frequency and urgency. Negative for dysuria, flank pain and hematuria.  Musculoskeletal: Positive for back pain. Negative for arthralgias, joint swelling and neck pain.  Skin: Negative for rash.  Allergic/Immunologic: Positive for environmental allergies.  Neurological: Negative for dizziness, tremors, numbness and headaches.  Hematological: Negative for adenopathy. Does not bruise/bleed easily.  Psychiatric/Behavioral: Positive for sleep  disturbance. Negative for behavioral problems (Depression) and suicidal ideas. The patient is nervous/anxious.      Today's Vitals   02/26/20 0956  BP: 132/78  Pulse: 75  Resp: 16  Temp: (!) 97.5 F (36.4 C)  SpO2: 99%  Weight: 143 lb (64.9 kg)  Height: 5\' 2"  (1.575 m)   Body mass index is 26.16 kg/m.  Physical  Exam Vitals and nursing note reviewed.  Constitutional:      General: She is not in acute distress.    Appearance: Normal appearance. She is well-developed. She is not diaphoretic.  HENT:     Head: Normocephalic and atraumatic.     Right Ear: Tympanic membrane is erythematous and bulging.     Left Ear: Tympanic membrane is erythematous and bulging.     Nose: Congestion present.     Right Turbinates: Swollen.     Left Turbinates: Swollen.     Right Sinus: Frontal sinus tenderness present.     Left Sinus: Frontal sinus tenderness present.     Mouth/Throat:     Pharynx: Posterior oropharyngeal erythema present. No oropharyngeal exudate.  Eyes:     Pupils: Pupils are equal, round, and reactive to light.  Neck:     Thyroid: No thyromegaly.     Vascular: No JVD.     Trachea: No tracheal deviation.  Cardiovascular:     Rate and Rhythm: Normal rate and regular rhythm.     Heart sounds: Normal heart sounds. No murmur heard.  No friction rub. No gallop.   Pulmonary:     Effort: Pulmonary effort is normal. No respiratory distress.     Breath sounds: Normal breath sounds. No wheezing or rales.     Comments: The patient has mild, non-productive cough present.  Chest:     Chest wall: No tenderness.  Abdominal:     General: Bowel sounds are normal.     Palpations: Abdomen is soft.     Tenderness: There is no abdominal tenderness.  Musculoskeletal:        General: Normal range of motion.     Cervical back: Normal range of motion and neck supple.  Lymphadenopathy:     Cervical: No cervical adenopathy.  Skin:    General: Skin is warm and dry.     Capillary Refill: Capillary refill takes less than 2 seconds.  Neurological:     General: No focal deficit present.     Mental Status: She is alert and oriented to person, place, and time.     Cranial Nerves: No cranial nerve deficit.  Psychiatric:        Mood and Affect: Mood normal.        Behavior: Behavior normal.        Thought Content:  Thought content normal.        Judgment: Judgment normal.     Depression screen [facility] 2/9 02/26/2020 10/02/2019 02/24/2019 11/25/2018 08/22/2018  Decreased Interest 0 0 0 0 0  Down, Depressed, Hopeless 0 0 0 0 0  PHQ - 2 Score 0 0 0 0 0    Functional Status Survey: Is the patient deaf or have difficulty hearing?: No Does the patient have difficulty seeing, even when wearing glasses/contacts?: No Does the patient have difficulty concentrating, remembering, or making decisions?: No Does the patient have difficulty walking or climbing stairs?: No Does the patient have difficulty dressing or bathing?: No Does the patient have difficulty doing errands alone such as visiting a doctor's office or shopping?: No  MMSE - Mini Mental State Exam 02/26/2020 02/24/2019 02/19/2018  Orientation to time 5 5 5   Orientation to Place 5 5 5   Registration 3 3 3   Attention/ Calculation 5 5 5   Recall 3 3 3   Language- name 2 objects 2 2 2   Language- repeat 1 1 1   Language- follow 3 step command 3 3 3   Language- read & follow direction 1 1 1   Write a sentence 1 1 1   Copy design 1 1 1   Total score 30 30 30     Fall Risk  02/26/2020 10/02/2019 02/24/2019 11/25/2018 08/22/2018  Falls in the past year? 0 0 0 0 0  Number falls in past yr: - - - - 0  Injury with Fall? - - - - 0      LABS: Recent Results (from the past 2160 hour(s))  POCT Urine Drug Screen     Status: Abnormal   Collection Time: 02/26/20 10:27 AM  Result Value Ref Range   POC Methamphetamine UR None Detected None Detected   POC Opiate Ur None Detected None Detected   POC Barbiturate UR None Detected None Detected   POC Amphetamine UR None Detected None Detected   POC Oxycodone UR None Detected None Detected   POC Cocaine UR None Detected None Detected   POC Ecstasy UR None Detected None Detected   POC TRICYCLICS UR None Detected None Detected   POC PHENCYCLIDINE UR None Detected None Detected   POC Marijuana UR None Detected None Detected   POC  Methadone UR None Detected None Detected   POC BENZODIAZEPINES UR Positive (A) None Detected   URINE TEMPERATURE     POC DRUG SCREEN OXIDANTS URINE     POC SPECIFIC GRAVITY URINE     POC PH URINE     Methylenedioxyamphetamine    UA/M w/rflx Culture, Routine     Status: Abnormal   Collection Time: 02/26/20 11:22 AM   Specimen: Urine   Urine  Result Value Ref Range   Specific Gravity, UA 1.020 1.005 - 1.030   pH, UA 6.0 5.0 - 7.5   Color, UA Yellow Yellow   Appearance Ur Clear Clear   Leukocytes,UA Trace (A) Negative   Protein,UA Trace Negative/Trace   Glucose, UA Negative Negative   Ketones, UA Negative Negative   RBC, UA Negative Negative   Bilirubin, UA Negative Negative   Urobilinogen, Ur 0.2 0.2 - 1.0 mg/dL   Nitrite, UA Negative Negative   Microscopic Examination See below:     Comment: Microscopic was indicated and was performed.   Urinalysis Reflex Comment     Comment: This specimen has reflexed to a Urine Culture.  Microscopic Examination     Status: Abnormal   Collection Time: 02/26/20 11:22 AM   Urine  Result Value Ref Range   WBC, UA 0-5 0 - 5 /hpf   RBC 3-10 (A) 0 - 2 /hpf   Epithelial Cells (non renal) >10 (A) 0 - 10 /hpf   Casts None seen None seen /lpf   Crystals Present (A) N/A   Crystal Type Calcium Oxalate N/A   Bacteria, UA None seen None seen/Few  Urine Culture, Reflex     Status: Abnormal   Collection Time: 02/26/20 11:22 AM   Urine  Result Value Ref Range   Urine Culture, Routine Final report (A)    Organism ID, Bacteria Escherichia coli (A)     Comment: Cefazolin <=4 ug/mL Cefazolin with an MIC <=16 predicts susceptibility to the oral agents  cefaclor, cefdinir, cefpodoxime, cefprozil, cefuroxime, cephalexin, and loracarbef when used for therapy of uncomplicated urinary tract infections due to E. coli, Klebsiella pneumoniae, and Proteus mirabilis. 10,000-25,000 colony forming units per mL    Antimicrobial Susceptibility Comment     Comment:        ** S = Susceptible; I = Intermediate; R = Resistant **                    P = Positive; N = Negative             MICS are expressed in micrograms per mL    Antibiotic                 RSLT#1    RSLT#2    RSLT#3    RSLT#4 Amoxicillin/Clavulanic Acid    S Ampicillin                     S Cefepime                       S Ceftriaxone                    S Cefuroxime                     S Ciprofloxacin                  R Ertapenem                      S Gentamicin                     S Imipenem                       S Levofloxacin                   R Meropenem                      S Nitrofurantoin                 S Piperacillin/Tazobactam        S Tetracycline                   R Tobramycin                     S Trimethoprim/Sulfa             R     Assessment/Plan: 1. Encounter for general adult medical examination with abnormal findings Annual health maintenance exam today.   2. Acute non-recurrent pansinusitis Start x-pack. Take as directed for 5 days. Rest and increase fluids. Take OTC medications as needed and as indicated for symptom relief.  - azithromycin (ZITHROMAX) 250 MG tablet; z-pack - take as directed for 5 days  Dispense: 6 tablet; Refill: 0  3. Aortic atherosclerosis (HCC) Patient currently on daily aspirin regimen. Will monitor.   4. Acquired hypothyroidism Continue levothyroxine at daily.  -- levothyroxine (SYNTHROID) 88 MCG tablet; Take 1 tablet (88 mcg total) by mouth daily before breakfast.  Dispense: 30 tablet; Refill: 3  5. Bladder prolapse, female, acquired Refer to Uro/GYN for further evaluation. - Ambulatory referral to Gynecology  6. Generalized anxiety disorder Patient takes alprazolam 0.25mg  at bedtime when needed.   7. Encounter for screening  mammogram for malignant neoplasm of breast - MM DIGITAL SCREENING BILATERAL; Future  8. Encounter for long-term (current) use of high-risk medication - POCT Urine Drug Screen appropriately positive  for BZO  9. Dysuria - UA/M w/rflx Culture, Routine - Microscopic Examination - Urine Culture, Reflex  General Counseling: Michelle [name] verbalizes understanding of the findings of todays visit and agrees with plan of treatment. I have discussed any further diagnostic evaluation that may be needed or ordered today. We also reviewed her medications today. she has been encouraged to call the office with any questions or concerns that should arise related to todays visit.    Counseling:  This patient was seen by Vincent GrosHeather Mamoudou Mulvehill FNP Collaboration with Dr Lyndon CodeFozia M Khan as a part of collaborative care agreement  Orders Placed This Encounter  Procedures  . Microscopic Examination  . Urine Culture, Reflex  . MM DIGITAL SCREENING BILATERAL  . UA/M w/rflx Culture, Routine  . Ambulatory referral to Gynecology  . POCT Urine Drug Screen    Meds ordered this encounter  Medications  . levothyroxine (SYNTHROID) 88 MCG tablet    Sig: Take 1 tablet (88 mcg total) by mouth daily before breakfast.    Dispense:  30 tablet    Refill:  3    Please note reduced dose.    Order Specific Question:   Supervising Provider    Answer:   Lyndon CodeKHAN, FOZIA M [[id number]]  . azithromycin (ZITHROMAX) 250 MG tablet    Sig: z-pack - take as directed for 5 days    Dispense:  6 tablet    Refill:  0    Order Specific Question:   Supervising Provider    Answer:   Lyndon CodeKHAN, FOZIA M [[id number]]    Total time spent: 45 Minutes  Time spent includes review of chart, medications, test results, and follow up plan with the patient.     Lyndon CodeFozia M Khan, MD  Internal Medicine

## 2020-02-27 NOTE — Progress Notes (Signed)
Waiting for results of culture and sensitivity

## 2020-02-29 NOTE — Progress Notes (Signed)
Suspect contamination.

## 2020-03-01 ENCOUNTER — Other Ambulatory Visit: Payer: Self-pay

## 2020-03-04 DIAGNOSIS — J40 Bronchitis, not specified as acute or chronic: Secondary | ICD-10-CM | POA: Diagnosis not present

## 2020-03-04 DIAGNOSIS — R059 Cough, unspecified: Secondary | ICD-10-CM | POA: Diagnosis not present

## 2020-03-04 LAB — UA/M W/RFLX CULTURE, ROUTINE
Bilirubin, UA: NEGATIVE
Glucose, UA: NEGATIVE
Ketones, UA: NEGATIVE
Nitrite, UA: NEGATIVE
RBC, UA: NEGATIVE
Specific Gravity, UA: 1.02 (ref 1.005–1.030)
Urobilinogen, Ur: 0.2 mg/dL (ref 0.2–1.0)
pH, UA: 6 (ref 5.0–7.5)

## 2020-03-04 LAB — URINE CULTURE, REFLEX

## 2020-03-04 LAB — MICROSCOPIC EXAMINATION
Bacteria, UA: NONE SEEN
Casts: NONE SEEN /lpf
Epithelial Cells (non renal): >10 /hpf — AB (ref 0–10)

## 2020-03-05 ENCOUNTER — Telehealth: Payer: Self-pay

## 2020-03-05 NOTE — Telephone Encounter (Signed)
lmom that pt that we returning her call regarding cough she can take OTC mucinex or we can see her Monday if she not feeling better we can send her chest xray

## 2020-03-17 DIAGNOSIS — N8110 Cystocele, unspecified: Secondary | ICD-10-CM | POA: Insufficient documentation

## 2020-03-17 DIAGNOSIS — I700 Atherosclerosis of aorta: Secondary | ICD-10-CM | POA: Insufficient documentation

## 2020-03-17 DIAGNOSIS — Z1231 Encounter for screening mammogram for malignant neoplasm of breast: Secondary | ICD-10-CM | POA: Insufficient documentation

## 2020-03-17 NOTE — Addendum Note (Signed)
Addended by: Vincent Gros on: 03/17/2020 11:01 AM   Modules accepted: Level of Service

## 2020-03-19 ENCOUNTER — Other Ambulatory Visit: Payer: Self-pay

## 2020-03-19 DIAGNOSIS — I10 Essential (primary) hypertension: Secondary | ICD-10-CM

## 2020-03-19 MED ORDER — AMLODIPINE BESYLATE 5 MG PO TABS: 5.0000 mg | ORAL_TABLET | Freq: Every day | ORAL | 3 refills | Status: DC

## 2020-03-23 ENCOUNTER — Ambulatory Visit (INDEPENDENT_AMBULATORY_CARE_PROVIDER_SITE_OTHER): Payer: PPO | Admitting: Hospice and Palliative Medicine

## 2020-03-23 ENCOUNTER — Encounter: Payer: Self-pay | Admitting: Hospice and Palliative Medicine

## 2020-03-23 VITALS — Ht 62.0 in | Wt 140.0 lb

## 2020-03-23 DIAGNOSIS — J0140 Acute pansinusitis, unspecified: Secondary | ICD-10-CM

## 2020-03-23 DIAGNOSIS — J209 Acute bronchitis, unspecified: Secondary | ICD-10-CM

## 2020-03-23 MED ORDER — PREDNISONE 10 MG PO TABS: ORAL_TABLET | ORAL | 0 refills | Status: DC

## 2020-03-23 MED ORDER — HYDROCOD POLST-CPM POLST ER 10-8 MG/5ML PO SUER: 5.0000 mL | Freq: Two times a day (BID) | ORAL | 0 refills | Status: DC | PRN

## 2020-03-23 MED ORDER — AMOXICILLIN-POT CLAVULANATE 875-125 MG PO TABS: 1.0000 | ORAL_TABLET | Freq: Two times a day (BID) | ORAL | 0 refills | Status: DC

## 2020-03-23 NOTE — Progress Notes (Signed)
[facility] [address] [place], [place] [address]  Internal MEDICINE  Telephone Visit  Patient Name: Michelle [name]  [id number]  [id number]  Date of Service: 03/25/2020  I connected with the patient at 1148 by telephone and verified the patients identity using two identifiers.   I discussed the limitations, risks, security and privacy concerns of performing an evaluation and management service by telephone and the availability of in person appointments. I also discussed with the patient that there may be a patient responsible charge related to the service.  The patient expressed understanding and agrees to proceed.    Chief Complaint  Patient presents with  . Telephone Assessment    [id number]  . Telephone Screen    Pt went to the walkin clinic they did xray its showed bronchitis   . Cough  . Laryngitis    HPI Patient is being seen today for sick visit She has been experiencing symptoms of congestion, productive cough and wheezing She has completed a course of azithromycin without resolution of symptoms, reports since stopping medication symptoms have worsened Was seen by urgent care 10/14 for worsening symptoms--COVID testing was negative at that time, CXR revealed peribronchial thickening--was given benzonatate which has not improved symptoms Has remained afebrile but reports fatigue as well as body aches   Current Medication: Outpatient Encounter Medications as of 03/23/2020  Medication Sig  . ALPRAZolam (XANAX) 0.25 MG tablet Take 1 tablet (0.25 mg total) by mouth at bedtime as needed.  Marland Kitchen amLODipine (NORVASC) 5 MG tablet Take 1 tablet (5 mg total) by mouth daily.  Marland Kitchen aspirin EC 81 MG tablet Take 81 mg by mouth daily.  . fluticasone (FLONASE) 50 MCG/ACT nasal spray Place 1 spray into both nostrils daily as needed.  Marland Kitchen levothyroxine (SYNTHROID) 88 MCG tablet Take 1 tablet (88 mcg total) by mouth daily before breakfast.  . [DISCONTINUED] azithromycin (ZITHROMAX)  250 MG tablet z-pack - take as directed for 5 days  . amoxicillin-clavulanate (AUGMENTIN) 875-125 MG tablet Take 1 tablet by mouth 2 (two) times daily.  . chlorpheniramine-HYDROcodone (TUSSIONEX PENNKINETIC ER) 10-8 MG/5ML SUER Take 5 mLs by mouth every 12 (twelve) hours as needed for cough.  . predniSONE (DELTASONE) 10 MG tablet Take 1 tablet three times a day with a meal for 2 days, take 1 tablet  twice daily with a meal for 2 days, take 1 tablet once daily with a meal for 2 days   No facility-administered encounter medications on file as of 03/23/2020.    Surgical History: Past Surgical History:  Procedure Laterality Date  . child birth natural    . diatation      Medical History: Past Medical History:  Diagnosis Date  . Bladder incontinence   . Hypertension   . Hypothyroidism     Family History: Family History  Problem Relation Age of Onset  . Cancer Father   . Hyperlipidemia Father   . Hypertension Father     Social History   Socioeconomic History  . Marital status: Married    Spouse name: Not on file  . Number of children: Not on file  . Years of education: Not on file  . Highest education level: Not on file  Occupational History  . Not on file  Tobacco Use  . Smoking status: Former Smoker    Types: Cigarettes  . Smokeless tobacco: Never Used  Substance and Sexual Activity  . Alcohol use: No  . Drug use: No  . Sexual activity: Not on  file  Other Topics Concern  . Not on file  Social History Narrative  . Not on file   Social Determinants of Health   Financial Resource Strain:   . Difficulty of Paying Living Expenses: Not on file  Food Insecurity:   . Worried About Programme researcher, broadcasting/film/video in the Last Year: Not on file  . Ran Out of Food in the Last Year: Not on file  Transportation Needs:   . Lack of Transportation (Medical): Not on file  . Lack of Transportation (Non-Medical): Not on file  Physical Activity:   . Days of Exercise per Week: Not on file   . Minutes of Exercise per Session: Not on file  Stress:   . Feeling of Stress : Not on file  Social Connections:   . Frequency of Communication with Friends and Family: Not on file  . Frequency of Social Gatherings with Friends and Family: Not on file  . Attends Religious Services: Not on file  . Active Member of Clubs or Organizations: Not on file  . Attends Banker Meetings: Not on file  . Marital Status: Not on file  Intimate Partner Violence:   . Fear of Current or Ex-Partner: Not on file  . Emotionally Abused: Not on file  . Physically Abused: Not on file  . Sexually Abused: Not on file    Review of Systems  Constitutional: Negative for chills, diaphoresis and fatigue.  HENT: Positive for congestion, sinus pressure and sinus pain. Negative for ear pain and postnasal drip.   Eyes: Negative for photophobia, discharge, redness, itching and visual disturbance.  Respiratory: Negative for cough, shortness of breath and wheezing.   Cardiovascular: Negative for chest pain, palpitations and leg swelling.  Gastrointestinal: Negative for abdominal pain, constipation, diarrhea, nausea and vomiting.  Genitourinary: Negative for dysuria and flank pain.  Musculoskeletal: Negative for arthralgias, back pain, gait problem and neck pain.  Skin: Negative for color change.  Allergic/Immunologic: Negative for environmental allergies and food allergies.  Neurological: Negative for dizziness and headaches.  Hematological: Does not bruise/bleed easily.  Psychiatric/Behavioral: Negative for agitation, behavioral problems (depression) and hallucinations.    Vital Signs: Ht 5\' 2"  (1.575 m)   Wt 140 lb (63.5 kg)   BMI 25.61 kg/m    Observation/Objective: Congestion in voice, hoarseness and coughing. Appears acutely ill, no signs of respiratory distress.  Assessment/Plan: 1. Acute non-recurrent pansinusitis Due to recurring and worsening symptoms will treat with 10 day course of  Augmentin, advised to take with food to avoid GI upset Will need to follow-up in 10 days to assess symptoms - amoxicillin-clavulanate (AUGMENTIN) 875-125 MG tablet; Take 1 tablet by mouth 2 (two) times daily.  Dispense: 20 tablet; Refill: 0  2. Acute bronchitis, unspecified organism Will treat lingering and persistent cough with low dose short course prednisone taper as well as Tussionex to help with cough suppressant while she is sleeping - predniSONE (DELTASONE) 10 MG tablet; Take 1 tablet three times a day with a meal for 2 days, take 1 tablet  twice daily with a meal for 2 days, take 1 tablet once daily with a meal for 2 days  Dispense: 12 tablet; Refill: 0 - chlorpheniramine-HYDROcodone (TUSSIONEX PENNKINETIC ER) 10-8 MG/5ML SUER; Take 5 mLs by mouth every 12 (twelve) hours as needed for cough.  Dispense: 70 mL; Refill: 0  General Counseling: Michelle [name] understanding of the findings of today's phone visit and agrees with plan of treatment. I have discussed any further diagnostic evaluation  that may be needed or ordered today. We also reviewed her medications today. she has been encouraged to call the office with any questions or concerns that should arise related to todays visit.      Meds ordered this encounter  Medications  . amoxicillin-clavulanate (AUGMENTIN) 875-125 MG tablet    Sig: Take 1 tablet by mouth 2 (two) times daily.    Dispense:  20 tablet    Refill:  0  . predniSONE (DELTASONE) 10 MG tablet    Sig: Take 1 tablet three times a day with a meal for 2 days, take 1 tablet  twice daily with a meal for 2 days, take 1 tablet once daily with a meal for 2 days    Dispense:  12 tablet    Refill:  0  . chlorpheniramine-HYDROcodone (TUSSIONEX PENNKINETIC ER) 10-8 MG/5ML SUER    Sig: Take 5 mLs by mouth every 12 (twelve) hours as needed for cough.    Dispense:  70 mL    Refill:  0     Time spent: 30 Minutes Time spent includes review of chart, medications, test  results and follow-up plan with the patient.  Lubertha Basque Deyon Chizek AGNP-C Internal medicine

## 2020-03-25 ENCOUNTER — Encounter: Payer: Self-pay | Admitting: Hospice and Palliative Medicine

## 2020-04-01 ENCOUNTER — Encounter: Payer: Self-pay | Admitting: Hospice and Palliative Medicine

## 2020-04-01 ENCOUNTER — Other Ambulatory Visit: Payer: Self-pay

## 2020-04-01 ENCOUNTER — Ambulatory Visit: Payer: PPO | Admitting: Hospice and Palliative Medicine

## 2020-04-01 DIAGNOSIS — J209 Acute bronchitis, unspecified: Secondary | ICD-10-CM

## 2020-04-01 DIAGNOSIS — M1990 Unspecified osteoarthritis, unspecified site: Secondary | ICD-10-CM | POA: Diagnosis not present

## 2020-04-01 DIAGNOSIS — G4700 Insomnia, unspecified: Secondary | ICD-10-CM

## 2020-04-01 DIAGNOSIS — F411 Generalized anxiety disorder: Secondary | ICD-10-CM

## 2020-04-01 MED ORDER — ALPRAZOLAM 0.25 MG PO TABS: 0.2500 mg | ORAL_TABLET | Freq: Every evening | ORAL | 0 refills | Status: DC | PRN

## 2020-04-01 MED ORDER — IBUPROFEN 800 MG PO TABS: 800.0000 mg | ORAL_TABLET | Freq: Three times a day (TID) | ORAL | 0 refills | Status: DC | PRN

## 2020-04-01 NOTE — Progress Notes (Signed)
[facility] [address] [place], [place] [address]  Internal MEDICINE  Office Visit Note  Patient Name: Michelle [name]  [id number]  [id number]  Date of Service: 04/05/2020  Chief Complaint  Patient presents with  . Follow-up    pt says meds feel like they're working, still has no energy  . Hypertension  . 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 ALPRAZolam (XANAX) 0.25 MG tablet Take 1 tablet (0.25 mg total) by mouth at bedtime as needed.  Marland Kitchen amLODipine (NORVASC) 5 MG tablet Take 1 tablet (5 mg total) by mouth daily.  Marland Kitchen amoxicillin-clavulanate (AUGMENTIN) 875-125 MG tablet Take 1 tablet by mouth 2 (two) times daily.  Marland Kitchen aspirin EC 81 MG tablet Take 81 mg by mouth daily.  . fluticasone (FLONASE) 50 MCG/ACT nasal spray Place 1 spray into both nostrils daily as needed.  Marland Kitchen levothyroxine (SYNTHROID) 88 MCG tablet Take 1 tablet (88 mcg total) by mouth daily before breakfast.  . [DISCONTINUED] ALPRAZolam (XANAX) 0.25 MG tablet Take 1 tablet (0.25 mg total) by mouth at bedtime as needed.  . [DISCONTINUED] predniSONE (DELTASONE) 10 MG tablet Take 1 tablet three times a day with a meal for 2 days, take 1 tablet  twice daily with a meal for 2 days, take 1 tablet once daily with a meal for 2 days  . ibuprofen (ADVIL) 800 MG tablet Take 1 tablet (800 mg total) by mouth every 8 (eight) hours as needed.   . [DISCONTINUED] chlorpheniramine-HYDROcodone (TUSSIONEX PENNKINETIC ER) 10-8 MG/5ML SUER Take 5 mLs by mouth every 12 (twelve) hours as needed for cough. (Patient not taking: Reported on 04/01/2020)   No facility-administered encounter medications on file as of 04/01/2020.    Surgical History: Past Surgical History:  Procedure Laterality Date  . child birth natural    . diatation      Medical History: Past Medical History:  Diagnosis Date  . Bladder incontinence   . Hypertension   . Hypothyroidism     Family History: Family History  Problem Relation Age of Onset  . Cancer Father   . Hyperlipidemia Father   . Hypertension Father     Social History   Socioeconomic History  . Marital status: Married    Spouse name: Not on file  . Number of children: Not on file  . Years of education: Not on file  . Highest education level: Not on file  Occupational History  . Not on file  Tobacco Use  . Smoking status: Former Smoker    Types: Cigarettes  . Smokeless tobacco: Never Used  Substance and Sexual Activity  . Alcohol use: No  . Drug use: No  . Sexual activity: Not on file  Other Topics Concern  . Not on file  Social History Narrative  . Not on file   Social Determinants of Health   Financial Resource Strain:   . Difficulty of Paying Living Expenses: Not on file  Food Insecurity:   .  Worried About Programme researcher, broadcasting/film/video in the Last Year: Not on file  . Ran Out of Food in the Last Year: Not on file  Transportation Needs:   . Lack of Transportation (Medical): Not on file  . Lack of Transportation (Non-Medical): Not on file  Physical Activity:   . Days of Exercise per Week: Not on file  . Minutes of Exercise per Session: Not on file  Stress:   . Feeling of Stress : Not on file  Social Connections:   . Frequency of Communication with Friends and Family: Not on file  . Frequency of Social Gatherings with Friends and Family: Not on file  . Attends Religious  Services: Not on file  . Active Member of Clubs or Organizations: Not on file  . Attends Banker Meetings: Not on file  . Marital Status: Not on file  Intimate Partner Violence:   . Fear of Current or Ex-Partner: Not on file  . Emotionally Abused: Not on file  . Physically Abused: Not on file  . Sexually Abused: Not on file      Review of Systems  Constitutional: Negative for chills, diaphoresis and fatigue.  HENT: Negative for ear pain, postnasal drip and sinus pressure.   Eyes: Negative for photophobia, discharge, redness, itching and visual disturbance.  Respiratory: Negative for cough, shortness of breath and wheezing.   Cardiovascular: Negative for chest pain, palpitations and leg swelling.  Gastrointestinal: Negative for abdominal pain, constipation, diarrhea, nausea and vomiting.  Genitourinary: Negative for dysuria and flank pain.  Musculoskeletal: Positive for arthralgias. Negative for back pain, gait problem and neck pain.  Skin: Negative for color change.  Allergic/Immunologic: Negative for environmental allergies and food allergies.  Neurological: Negative for dizziness and headaches.  Hematological: Does not bruise/bleed easily.  Psychiatric/Behavioral: Negative for agitation, behavioral problems (depression) and hallucinations.    Vital Signs: BP 140/86   Pulse 66   Temp 97.6 F (36.4 C)   Resp 16   Ht 5\' 2"  (1.575 m)   Wt 142 lb (64.4 kg)   SpO2 98%   BMI 25.97 kg/m    Physical Exam Vitals reviewed.  Constitutional:      Appearance: Normal appearance. She is normal weight.  HENT:     Nose: Nose normal.     Mouth/Throat:     Mouth: Mucous membranes are moist.     Pharynx: Oropharynx is clear.  Cardiovascular:     Rate and Rhythm: Normal rate and regular rhythm.     Pulses: Normal pulses.     Heart sounds: Normal heart sounds.  Pulmonary:     Effort: Pulmonary effort is normal.     Breath sounds: Normal breath sounds.   Musculoskeletal:        General: Normal range of motion.     Cervical back: Normal range of motion.  Skin:    General: Skin is warm.  Neurological:     General: No focal deficit present.     Mental Status: She is alert and oriented to person, place, and time. Mental status is at baseline.  Psychiatric:        Mood and Affect: Mood normal.        Behavior: Behavior normal.        Thought Content: Thought content normal.    Assessment/Plan: 1. Acute bronchitis, unspecified organism Symptoms have resolved at this time  2. Arthritis Mostly affecting fingers and hands due to profession Refilled ibuprofen but discussed using alternative therapy such  as tylenol to avoid potential side effects of ibuprofen use - ibuprofen (ADVIL) 800 MG tablet; Take 1 tablet (800 mg total) by mouth every 8 (eight) hours as needed.  Dispense: 30 tablet; Refill: 0  3. Insomnia, unspecified type Continue with alprazolam as need for insomnia - ALPRAZolam (XANAX) 0.25 MG tablet; Take 1 tablet (0.25 mg total) by mouth at bedtime as needed.  Dispense: 30 tablet; Refill: 0  PDMP reviewed: narcotic score 210, sedative score 251, stimulant score 0, overdose risk 360  General Counseling: Halah verbalizes understanding of the findings of todays visit and agrees with plan of treatment. I have discussed any further diagnostic evaluation that may be needed or ordered today. We also reviewed her medications today. she has been encouraged to call the office with any questions or concerns that should arise related to todays visit.   Meds ordered this encounter  Medications  . ALPRAZolam (XANAX) 0.25 MG tablet    Sig: Take 1 tablet (0.25 mg total) by mouth at bedtime as needed.    Dispense:  30 tablet    Refill:  0  . ibuprofen (ADVIL) 800 MG tablet    Sig: Take 1 tablet (800 mg total) by mouth every 8 (eight) hours as needed.    Dispense:  30 tablet    Refill:  0    Time spent: 30 Minutes Time spent includes  review of chart, medications, test results and follow-up plan with the patient.  This patient was seen by Leeanne Deed AGNP-C in Collaboration with Dr [name] as a part of collaborative care agreement     Lubertha Basque. Dustie Brittle AGNP-C Internal medicine

## 2020-04-05 ENCOUNTER — Encounter: Payer: Self-pay | Admitting: Obstetrics and Gynecology

## 2020-04-05 ENCOUNTER — Encounter: Payer: Self-pay | Admitting: Hospice and Palliative Medicine

## 2020-04-07 ENCOUNTER — Other Ambulatory Visit: Payer: Self-pay

## 2020-04-07 ENCOUNTER — Encounter: Payer: Self-pay | Admitting: Obstetrics and Gynecology

## 2020-04-07 ENCOUNTER — Ambulatory Visit: Payer: PPO | Admitting: Obstetrics and Gynecology

## 2020-04-07 VITALS — BP 128/70 | Ht 62.0 in | Wt 143.2 lb

## 2020-04-07 DIAGNOSIS — N8110 Cystocele, unspecified: Secondary | ICD-10-CM

## 2020-04-07 NOTE — Progress Notes (Signed)
Pt has been referred for bladder / vaginal prolapse.

## 2020-04-07 NOTE — Progress Notes (Signed)
   Patient ID: Michelle [name], female   DOB: [birth year], 69 y.o.   MRN: [id number]  Reason for Consult: Gynecologic Exam   Referred by Michelle Code, MD  Subjective:     HPI:  [name] is a 69 y.o. female. She is here with complaints of a vaginal bulge   Past Medical History:  Diagnosis Date  . Bladder incontinence   . Hypertension   . Hypothyroidism    Family History  Problem Relation Age of Onset  . Cancer Father   . Hyperlipidemia Father   . Hypertension Father    Past Surgical History:  Procedure Laterality Date  . child birth natural    . diatation      Short Social History:  Social History   Tobacco Use  . Smoking status: Former Smoker    Types: Cigarettes  . Smokeless tobacco: Never Used  Substance Use Topics  . Alcohol use: No    Allergies  Allergen Reactions  . Sulfa Antibiotics Hives and Nausea And Vomiting    Current Outpatient Medications  Medication Sig Dispense Refill  . ALPRAZolam (XANAX) 0.25 MG tablet Take 1 tablet (0.25 mg total) by mouth at bedtime as needed. 30 tablet 0  . amLODipine (NORVASC) 5 MG tablet Take 1 tablet (5 mg total) by mouth daily. 30 tablet 3  . aspirin EC 81 MG tablet Take 81 mg by mouth daily.    . fluticasone (FLONASE) 50 MCG/ACT nasal spray Place 1 spray into both nostrils daily as needed. 16 g 3  . ibuprofen (ADVIL) 800 MG tablet Take 1 tablet (800 mg total) by mouth every 8 (eight) hours as needed. 30 tablet 0  . levothyroxine (SYNTHROID) 88 MCG tablet Take 1 tablet (88 mcg total) by mouth daily before breakfast. 30 tablet 3   No current facility-administered medications for this visit.    REVIEW OF SYSTEMS      Objective:  Objective   Vitals:   04/07/20 1327  BP: 128/70  Weight: 143 lb 3.2 oz (65 kg)  Height: 5\' 2"  (1.575 m)   Body mass index is 26.19 kg/m.  Physical Exam  POP-Q exam:      Aa = 0 Ba = +2 C =    gH = 4 pb = 4 TVL = 7  Ap =   BP =   D =      Assessment/Plan:     Michelle [name] is a 69 y.o. [phone] with Stage 3 prolapse We discussed how her diagnosis of prolapse and she was show illustrations of her prolapse. We discussed that prolapse is generally not life threatening but can cause difficulty with urination and bowel movements. This includes urinary retension and constipation. We discussed that if prolapse is bothersome a finger can be used to gently replace any prolapsing tissue into the vagina. We discussed treatment option for prolapse including pelvic floor physical therapy, pessary, and pelvic surgery. At this time the patient opts for pessary management of her prolapse. She will plan to follow up. She was provided with handouts from [facility] regarding prolapse.   More than 20 minutes were spent face to face with the patient in the room, reviewing the medical record, labs and images, and coordinating care for the patient. The plan of management was discussed in detail and counseling was provided.   [id number] MD [facility], [facility] 04/07/2020 2:19 PM

## 2020-04-22 ENCOUNTER — Telehealth: Payer: Self-pay

## 2020-04-22 NOTE — Telephone Encounter (Signed)
Pt called that she feel pressure when she used bathroom advised pt call gyn due to recently she was seen for her bladder

## 2020-05-13 ENCOUNTER — Ambulatory Visit (INDEPENDENT_AMBULATORY_CARE_PROVIDER_SITE_OTHER): Payer: PPO | Admitting: Obstetrics and Gynecology

## 2020-05-13 ENCOUNTER — Encounter: Payer: Self-pay | Admitting: Obstetrics and Gynecology

## 2020-05-13 ENCOUNTER — Other Ambulatory Visit: Payer: Self-pay

## 2020-05-13 VITALS — BP 122/70 | Ht 62.0 in | Wt 145.0 lb

## 2020-05-13 DIAGNOSIS — N8110 Cystocele, unspecified: Secondary | ICD-10-CM

## 2020-05-13 DIAGNOSIS — N952 Postmenopausal atrophic vaginitis: Secondary | ICD-10-CM | POA: Diagnosis not present

## 2020-05-13 MED ORDER — ESTROGENS, CONJUGATED 0.625 MG/GM VA CREA: TOPICAL_CREAM | VAGINAL | 6 refills | Status: DC

## 2020-05-13 NOTE — Progress Notes (Signed)
Stage 3 apical prolapse  Fitted with a size 3 donut pessary- note sent to order device. Patient will return for placement,   Pessary Fitting Patient presents for a pessary fitting. She desires a pessary as her means of controlling her symptoms of prolapse and/or urinary incontinence. She understands the care needed for a pessary and desires to proceed. Alternative treatment options have been discussed at length and the patient voices an understanding of each option.   PROCEDURE: The patient was placed in dorsal lithotomy position. Examination confirmed prolapse. She tried size 4-6 ring pessaries. The 6 was not expelled but caused rectal pressure.  She tried a size 5 dish with support and knob, which was more comfortable but with effort she expelled.  She tried a size 3 donut which fit well.  The patient subsequently ambulated, voided and performed valsalva maneuvers without dislodging the pessary and without discomfort. Care instructions were provided. Patient was discharged to home in stable condition.   More than 20 minutes were spent face to face with the patient in the room, reviewing the medical record, labs and images, and coordinating care for the patient. The plan of management was discussed in detail and counseling was provided.    Adelene Idler MD, [facility], [facility] 05/13/2020 11:39 AM

## 2020-05-13 NOTE — Progress Notes (Signed)
Pessary consult

## 2020-05-17 ENCOUNTER — Other Ambulatory Visit: Payer: Self-pay | Admitting: Hospice and Palliative Medicine

## 2020-05-17 ENCOUNTER — Telehealth: Payer: Self-pay

## 2020-05-17 DIAGNOSIS — G4700 Insomnia, unspecified: Secondary | ICD-10-CM

## 2020-05-17 DIAGNOSIS — M1990 Unspecified osteoarthritis, unspecified site: Secondary | ICD-10-CM

## 2020-05-17 MED ORDER — ALPRAZOLAM 0.25 MG PO TABS: 0.2500 mg | ORAL_TABLET | Freq: Every evening | ORAL | 0 refills | Status: DC | PRN

## 2020-05-17 NOTE — Telephone Encounter (Signed)
Sent 30 day supply to [facility]. Will need visit for next round of refills.

## 2020-05-19 ENCOUNTER — Other Ambulatory Visit: Payer: Self-pay

## 2020-05-19 ENCOUNTER — Telehealth: Payer: Self-pay

## 2020-05-19 ENCOUNTER — Ambulatory Visit (INDEPENDENT_AMBULATORY_CARE_PROVIDER_SITE_OTHER): Payer: PPO

## 2020-05-19 DIAGNOSIS — R300 Dysuria: Secondary | ICD-10-CM

## 2020-05-19 LAB — POCT URINALYSIS DIPSTICK
Blood, UA: POSITIVE
Glucose, UA: NEGATIVE
Ketones, UA: NEGATIVE
Leukocytes, UA: NEGATIVE
Nitrite, UA: NEGATIVE
Protein, UA: NEGATIVE
Spec Grav, UA: 1.025 (ref 1.010–1.025)
Urobilinogen, UA: NEGATIVE E.U./dL — AB
pH, UA: 6 (ref 5.0–8.0)

## 2020-05-19 NOTE — Telephone Encounter (Signed)
[place]- I noticed this note from [place] at 8;30 PM tonight. I do not know this pateint, who dropped off some urine at the office.Can you see if the urine has been sent for culture? I am unclear why she did not get an appointment. It is late at night for me to call her. Bev's note says the urine dip showed some blood. She needs closer eval than dropping off her urine.   I do not feel comfortable calling her so late at night, so we may need to have an office provider contact her.   You can always call me at home Thursday when I am post call... Claris Che

## 2020-05-19 NOTE — Telephone Encounter (Signed)
Pt came in for UA drop off d/y dysuria and "bad pressure when urinating" some blood was found in the urine. Pt states she is not bleeding. Advised to send to provider on call for possible medication. Please advise . I am off tomorrow, so if its after 5:00, please send to [place] [facility] so she can contact pt.

## 2020-05-20 DIAGNOSIS — R300 Dysuria: Secondary | ICD-10-CM | POA: Diagnosis not present

## 2020-05-20 NOTE — Progress Notes (Signed)
Urine specimen was found on nurses' counter at 2pm today.  Therefore, it was not sent for culture yesterday.  I will send it today.

## 2020-05-24 ENCOUNTER — Other Ambulatory Visit: Payer: Self-pay | Admitting: Obstetrics and Gynecology

## 2020-05-24 DIAGNOSIS — N3001 Acute cystitis with hematuria: Secondary | ICD-10-CM

## 2020-05-24 LAB — URINE CULTURE

## 2020-05-24 MED ORDER — NITROFURANTOIN MONOHYD MACRO 100 MG PO CAPS: 100.0000 mg | ORAL_CAPSULE | Freq: Two times a day (BID) | ORAL | 1 refills | Status: DC

## 2020-05-28 ENCOUNTER — Other Ambulatory Visit: Payer: Self-pay | Admitting: Nurse Practitioner

## 2020-05-28 ENCOUNTER — Encounter: Payer: Self-pay | Admitting: Nurse Practitioner

## 2020-05-28 ENCOUNTER — Other Ambulatory Visit: Payer: Self-pay

## 2020-05-28 ENCOUNTER — Ambulatory Visit (INDEPENDENT_AMBULATORY_CARE_PROVIDER_SITE_OTHER): Payer: PPO | Admitting: Nurse Practitioner

## 2020-05-28 VITALS — BP 121/72 | HR 78 | Temp 97.5°F | Resp 16 | Ht 62.0 in | Wt 144.2 lb

## 2020-05-28 DIAGNOSIS — J0140 Acute pansinusitis, unspecified: Secondary | ICD-10-CM

## 2020-05-28 DIAGNOSIS — E039 Hypothyroidism, unspecified: Secondary | ICD-10-CM | POA: Diagnosis not present

## 2020-05-28 DIAGNOSIS — N8110 Cystocele, unspecified: Secondary | ICD-10-CM

## 2020-05-28 DIAGNOSIS — Z0001 Encounter for general adult medical examination with abnormal findings: Secondary | ICD-10-CM

## 2020-05-28 DIAGNOSIS — F411 Generalized anxiety disorder: Secondary | ICD-10-CM

## 2020-05-28 DIAGNOSIS — Z79899 Other long term (current) drug therapy: Secondary | ICD-10-CM

## 2020-05-28 DIAGNOSIS — I700 Atherosclerosis of aorta: Secondary | ICD-10-CM

## 2020-05-28 DIAGNOSIS — R300 Dysuria: Secondary | ICD-10-CM

## 2020-05-28 DIAGNOSIS — G4700 Insomnia, unspecified: Secondary | ICD-10-CM | POA: Diagnosis not present

## 2020-05-28 DIAGNOSIS — I10 Essential (primary) hypertension: Secondary | ICD-10-CM

## 2020-05-28 DIAGNOSIS — Z1231 Encounter for screening mammogram for malignant neoplasm of breast: Secondary | ICD-10-CM

## 2020-05-28 MED ORDER — ESTRADIOL 0.5 MG PO TABS: 0.5000 mg | ORAL_TABLET | Freq: Every day | ORAL | 1 refills | Status: DC

## 2020-05-28 MED ORDER — LEVOTHYROXINE SODIUM 88 MCG PO TABS
88.0000 ug | ORAL_TABLET | Freq: Every day | ORAL | 1 refills | Status: DC
Start: 2020-05-28 — End: 2020-06-23

## 2020-05-28 MED ORDER — AMLODIPINE BESYLATE 5 MG PO TABS
5.0000 mg | ORAL_TABLET | Freq: Every day | ORAL | 1 refills | Status: DC
Start: 2020-05-28 — End: 2021-05-13

## 2020-05-28 MED ORDER — ALPRAZOLAM 0.25 MG PO TABS: 0.2500 mg | ORAL_TABLET | Freq: Every evening | ORAL | 2 refills | Status: DC | PRN

## 2020-05-28 NOTE — Progress Notes (Signed)
[facility] [place], [place] [address]  Internal MEDICINE  Office Visit Note  Patient Name: Michelle [name]  [id number]  [id number]  Date of Service: 06/12/2020  Chief Complaint  Patient presents with  . Follow-up  . Hypertension    The patient is here for follow up visit.  -doing well.  -blood pressure well controlled.  -does take alprazolam 0.25mg  at bedtime as needed for acute anxiety which leads to insomnia. She does need to have a refill for this today.  -thyroid panel has been stable. currently on 82mcg daily. Needs to have refills for this today.       Current Medication: Outpatient Encounter Medications as of 05/28/2020  Medication Sig  . aspirin EC 81 MG tablet Take 81 mg by mouth daily.  Marland Kitchen conjugated estrogens (PREMARIN) vaginal cream 1 gram vaginally nightly at bedtime for 2 weeks, 1 gram vaginally every other night at bedtime for 2 weeks, then 1 gram vaginally twice weekly  . fluticasone (FLONASE) 50 MCG/ACT nasal spray Place 1 spray into both nostrils daily as needed.  Marland Kitchen ibuprofen (ADVIL) 800 MG tablet TAKE 1 TABLET BY MOUTH EVERY 8 HOURS AS NEEDED  . nitrofurantoin, macrocrystal-monohydrate, (MACROBID) 100 MG capsule Take 1 capsule (100 mg total) by mouth 2 (two) times daily.  . [DISCONTINUED] ALPRAZolam (XANAX) 0.25 MG tablet Take 1 tablet (0.25 mg total) by mouth at bedtime as needed.  . [DISCONTINUED] amLODipine (NORVASC) 5 MG tablet Take 1 tablet (5 mg total) by mouth daily.  . [DISCONTINUED] estradiol (ESTRACE) 0.5 MG tablet Take 1 tablet (0.5 mg total) by mouth daily.  . [DISCONTINUED] levothyroxine (SYNTHROID) 88 MCG tablet Take 1 tablet (88 mcg total) by mouth daily before breakfast.  . ALPRAZolam (XANAX) 0.25 MG tablet Take 1 tablet (0.25 mg total) by mouth at bedtime as needed.  Marland Kitchen amLODipine (NORVASC) 5 MG tablet Take 1 tablet (5 mg total) by mouth daily.  Marland Kitchen levothyroxine (SYNTHROID) 88 MCG tablet Take 1 tablet (88 mcg total) by  mouth daily before breakfast.   No facility-administered encounter medications on file as of 05/28/2020.    Surgical History: Past Surgical History:  Procedure Laterality Date  . child birth natural    . diatation      Medical History: Past Medical History:  Diagnosis Date  . Bladder incontinence   . Hypertension   . Hypothyroidism     Family History: Family History  Problem Relation Age of Onset  . Cancer Father   . Hyperlipidemia Father   . Hypertension Father     Social History   Socioeconomic History  . Marital status: Married    Spouse name: Not on file  . Number of children: Not on file  . Years of education: Not on file  . Highest education level: Not on file  Occupational History  . Not on file  Tobacco Use  . Smoking status: Former Smoker    Types: Cigarettes  . Smokeless tobacco: Never Used  Substance and Sexual Activity  . Alcohol use: No  . Drug use: No  . Sexual activity: Not on file  Other Topics Concern  . Not on file  Social History Narrative  . Not on file   Social Determinants of Health   Financial Resource Strain: Not on file  Food Insecurity: Not on file  Transportation Needs: Not on file  Physical Activity: Not on file  Stress: Not on file  Social Connections: Not on file  Intimate Partner Violence: Not  on file      Review of Systems  Constitutional: Negative for chills, fatigue and unexpected weight change.  HENT: Negative for congestion, postnasal drip, rhinorrhea, sneezing and sore throat.   Respiratory: Negative for cough, chest tightness, shortness of breath and wheezing.   Cardiovascular: Negative for chest pain and palpitations.  Gastrointestinal: Negative for abdominal pain, constipation, diarrhea, nausea and vomiting.  Endocrine: Negative for cold intolerance, heat intolerance, polydipsia and polyuria.  Musculoskeletal: Negative for arthralgias, back pain, joint swelling and neck pain.  Skin: Negative for rash.   Neurological: Negative.  Negative for tremors and numbness.  Hematological: Negative for adenopathy. Does not bruise/bleed easily.  Psychiatric/Behavioral: Negative for behavioral problems (Depression), sleep disturbance and suicidal ideas. The patient is nervous/anxious.        Intermittent anxiety which leads her to have insomnia on some nights.     Today's Vitals   05/28/20 1041  BP: 121/72  Pulse: 78  Resp: 16  Temp: (!) 97.5 F (36.4 C)  SpO2: 98%  Weight: 144 lb 3.2 oz (65.4 kg)  Height: 5\' 2"  (1.575 m)   Body mass index is 26.37 kg/m.  Physical Exam Vitals and nursing note reviewed.  Constitutional:      General: She is not in acute distress.    Appearance: Normal appearance. She is well-developed and well-nourished. She is not diaphoretic.  HENT:     Head: Normocephalic and atraumatic.     Nose: Nose normal.     Mouth/Throat:     Mouth: Oropharynx is clear and moist.     Pharynx: No oropharyngeal exudate.  Eyes:     Extraocular Movements: EOM normal.     Pupils: Pupils are equal, round, and reactive to light.  Neck:     Thyroid: No thyromegaly.     Vascular: No carotid bruit or JVD.     Trachea: No tracheal deviation.  Cardiovascular:     Rate and Rhythm: Normal rate and regular rhythm.     Heart sounds: Normal heart sounds. No murmur heard. No friction rub. No gallop.   Pulmonary:     Effort: Pulmonary effort is normal. No respiratory distress.     Breath sounds: Normal breath sounds. No wheezing or rales.  Chest:     Chest wall: No tenderness.  Abdominal:     Palpations: Abdomen is soft.  Musculoskeletal:        General: Normal range of motion.     Cervical back: Normal range of motion and neck supple.  Lymphadenopathy:     Cervical: No cervical adenopathy.  Skin:    General: Skin is warm and dry.     Capillary Refill: Capillary refill takes less than 2 seconds.  Neurological:     General: No focal deficit present.     Mental Status: She is alert  and oriented to person, place, and time.     Cranial Nerves: No cranial nerve deficit.  Psychiatric:        Mood and Affect: Mood and affect and mood normal.        Behavior: Behavior normal.        Thought Content: Thought content normal.        Judgment: Judgment normal.    Assessment/Plan:  1. Essential hypertension Blood pressure stable. Continue bp medication as prescribed  - amLODipine (NORVASC) 5 MG tablet; Take 1 tablet (5 mg total) by mouth daily.  Dispense: 90 tablet; Refill: 1  2. Acquired hypothyroidism Thyroid panel stable. Continue levothyroxine as  prescribed  - levothyroxine (SYNTHROID) 88 MCG tablet; Take 1 tablet (88 mcg total) by mouth daily before breakfast.  Dispense: 90 tablet; Refill: 1  3. Insomnia, unspecified type May take alprazolam 0.25mg  tablets at bedtime as needed for anxiety causing insomnia. New prescription sent to her pharmacy today.  - ALPRAZolam (XANAX) 0.25 MG tablet; Take 1 tablet (0.25 mg total) by mouth at bedtime as needed.  Dispense: 30 tablet; Refill: 2   General Counseling: bernardette waldron understanding of the findings of todays visit and agrees with plan of treatment. I have discussed any further diagnostic evaluation that may be needed or ordered today. We also reviewed her medications today. she has been encouraged to call the office with any questions or concerns that should arise related to todays visit.   This patient was seen by Vincent Gros FNP Collaboration with Dr [name] as a part of collaborative care agreement  Meds ordered this encounter  Medications  . DISCONTD: estradiol (ESTRACE) 0.5 MG tablet    Sig: Take 1 tablet (0.5 mg total) by mouth daily.    Dispense:  90 tablet    Refill:  1    Order Specific Question:   Supervising Provider    Answer:   [name] [[id number]]  . ALPRAZolam (XANAX) 0.25 MG tablet    Sig: Take 1 tablet (0.25 mg total) by mouth at bedtime as needed.    Dispense:  30 tablet    Refill:  2     Order Specific Question:   Supervising Provider    Answer:   [name] [[id number]]  . amLODipine (NORVASC) 5 MG tablet    Sig: Take 1 tablet (5 mg total) by mouth daily.    Dispense:  90 tablet    Refill:  1    Order Specific Question:   Supervising Provider    Answer:   [name] [[id number]]  . levothyroxine (SYNTHROID) 88 MCG tablet    Sig: Take 1 tablet (88 mcg total) by mouth daily before breakfast.    Dispense:  90 tablet    Refill:  1    Please note reduced dose.    Order Specific Question:   Supervising Provider    Answer:   [name] [[id number]]    Total time spent: 25 Minutes   Time spent includes review of chart, medications, test results, and follow up plan with the patient.      Dr [name] Internal medicine

## 2020-06-12 DIAGNOSIS — G4700 Insomnia, unspecified: Secondary | ICD-10-CM | POA: Insufficient documentation

## 2020-06-18 ENCOUNTER — Other Ambulatory Visit: Payer: Self-pay

## 2020-06-21 ENCOUNTER — Encounter: Payer: Self-pay | Admitting: Obstetrics and Gynecology

## 2020-06-21 ENCOUNTER — Other Ambulatory Visit: Payer: Self-pay

## 2020-06-21 ENCOUNTER — Ambulatory Visit (INDEPENDENT_AMBULATORY_CARE_PROVIDER_SITE_OTHER): Payer: PPO | Admitting: Obstetrics and Gynecology

## 2020-06-21 VITALS — BP 120/70 | Ht 62.0 in | Wt 143.2 lb

## 2020-06-21 DIAGNOSIS — N8110 Cystocele, unspecified: Secondary | ICD-10-CM | POA: Diagnosis not present

## 2020-06-21 NOTE — Progress Notes (Signed)
Pessary Fitting Patient presents for a pessary fitting. She desires a pessary as her means of controlling her symptoms of prolapse and/or urinary incontinence. She understands the care needed for a pessary and desires to proceed. Alternative treatment options have been discussed at length and the patient voices an understanding of each option.   PROCEDURE: The patient was placed in dorsal lithotomy position. Examination confirmed prolapse. A 3 donut pessary was fitted without difficulty. The patient subsequently ambulated, voided and performed valsalva maneuvers without dislodging the pessary and without discomfort. Care instructions were provided. Patient was discharged to home in stable condition.   More than 15 minutes were spent face to face with the patient in the room, reviewing the medical record, labs and images, and coordinating care for the patient. The plan of management was discussed in detail and counseling was provided.   Patient given samples of premarin and was able to fill premarin which was rx'd last visit.  Adelene Idler MD, [facility], [facility] 06/21/2020 2:53 PM

## 2020-06-21 NOTE — Progress Notes (Signed)
Pessary placement

## 2020-06-23 ENCOUNTER — Other Ambulatory Visit: Payer: Self-pay

## 2020-06-23 DIAGNOSIS — E039 Hypothyroidism, unspecified: Secondary | ICD-10-CM

## 2020-06-23 MED ORDER — LEVOTHYROXINE SODIUM 88 MCG PO TABS: 88.0000 ug | ORAL_TABLET | Freq: Every day | ORAL | 0 refills | Status: DC

## 2020-06-29 ENCOUNTER — Other Ambulatory Visit: Payer: Self-pay

## 2020-06-29 ENCOUNTER — Ambulatory Visit (INDEPENDENT_AMBULATORY_CARE_PROVIDER_SITE_OTHER): Payer: PPO | Admitting: Obstetrics and Gynecology

## 2020-06-29 ENCOUNTER — Encounter: Payer: Self-pay | Admitting: Obstetrics and Gynecology

## 2020-06-29 VITALS — BP 116/70 | Ht 62.0 in | Wt 145.0 lb

## 2020-06-29 DIAGNOSIS — N8110 Cystocele, unspecified: Secondary | ICD-10-CM

## 2020-06-29 NOTE — Progress Notes (Signed)
Patient ID: Michelle [name], female   DOB: 12/25/1950, 70 y.o.   MRN: [id number]  Reason for Consult: Gynecologic Exam   Referred by [name], MD  Subjective:     HPI:  Michelle [name] is a 70 y.o. female . Patient reports that she was straining for a BM and the pessary fell into the toilet. She was able to place the pessary herself today. She has chronic constipation. Miralax and colace have not helped relieve that. She has GI follow up planned for April. She has been very happy with the pessary otherwise.    Past Medical History:  Diagnosis Date  . Bladder incontinence   . Hypertension   . Hypothyroidism    Family History  Problem Relation Age of Onset  . Cancer Father   . Hyperlipidemia Father   . Hypertension Father    Past Surgical History:  Procedure Laterality Date  . child birth natural    . diatation      Short Social History:  Social History   Tobacco Use  . Smoking status: Former Smoker    Types: Cigarettes  . Smokeless tobacco: Never Used  Substance Use Topics  . Alcohol use: No    Allergies  Allergen Reactions  . Sulfa Antibiotics Hives and Nausea And Vomiting    Current Outpatient Medications  Medication Sig Dispense Refill  . ALPRAZolam (XANAX) 0.25 MG tablet Take 1 tablet (0.25 mg total) by mouth at bedtime as needed. 30 tablet 2  . amLODipine (NORVASC) 5 MG tablet Take 1 tablet (5 mg total) by mouth daily. 90 tablet 1  . aspirin EC 81 MG tablet Take 81 mg by mouth daily.    Marland Kitchen conjugated estrogens (PREMARIN) vaginal cream 1 gram vaginally nightly at bedtime for 2 weeks, 1 gram vaginally every other night at bedtime for 2 weeks, then 1 gram vaginally twice weekly 30 g 6  . estradiol (ESTRACE) 0.5 MG tablet TAKE 1 TABLET BY MOUTH ONCE DAILY 90 tablet 1  . fluticasone (FLONASE) 50 MCG/ACT nasal spray Place 1 spray into both nostrils daily as needed. 16 g 3  . ibuprofen (ADVIL) 800 MG tablet TAKE 1 TABLET BY MOUTH EVERY 8 HOURS AS NEEDED 30  tablet 0  . levothyroxine (SYNTHROID) 88 MCG tablet Take 1 tablet (88 mcg total) by mouth daily before breakfast. 90 tablet 0  . nitrofurantoin, macrocrystal-monohydrate, (MACROBID) 100 MG capsule Take 1 capsule (100 mg total) by mouth 2 (two) times daily. 14 capsule 1   No current facility-administered medications for this visit.    Review of Systems  Constitutional: Negative for chills, fatigue, fever and unexpected weight change.  HENT: Negative for trouble swallowing.  Eyes: Negative for loss of vision.  Respiratory: Negative for cough, shortness of breath and wheezing.  Cardiovascular: Negative for chest pain, leg swelling, palpitations and syncope.  GI: Negative for abdominal pain, blood in stool, diarrhea, nausea and vomiting.  GU: Negative for difficulty urinating, dysuria, frequency and hematuria.  Musculoskeletal: Negative for back pain, leg pain and joint pain.  Skin: Negative for rash.  Neurological: Negative for dizziness, headaches, light-headedness, numbness and seizures.  Psychiatric: Negative for behavioral problem, confusion, depressed mood and sleep disturbance.        Objective:  Objective   Vitals:   06/29/20 1128  BP: 116/70  Weight: 145 lb (65.8 kg)  Height: 5\' 2"  (1.575 m)   Body mass index is 26.52 kg/m.  Physical Exam Genitourinary:    Comments: Visual  inspection of perineum showed correctly placed pessary.     Assessment/Plan:     71 yo with donut pessary Was able to place pessary on her own today in the office. Will return as planned for regular cleaning.  Encouraged GI follow up for constipation.   More than 5 minutes were spent face to face with the patient in the room, reviewing the medical record, labs and images, and coordinating care for the patient. The plan of management was discussed in detail and counseling was provided.     Adelene Idler MD [facility], [facility] 06/29/2020 12:54 PM

## 2020-06-30 ENCOUNTER — Ambulatory Visit: Payer: Self-pay | Admitting: Urology

## 2020-07-29 ENCOUNTER — Other Ambulatory Visit: Payer: Self-pay | Admitting: Hospice and Palliative Medicine

## 2020-07-29 DIAGNOSIS — M1990 Unspecified osteoarthritis, unspecified site: Secondary | ICD-10-CM

## 2020-08-25 ENCOUNTER — Ambulatory Visit: Payer: PPO | Admitting: Hospice and Palliative Medicine

## 2020-08-26 ENCOUNTER — Other Ambulatory Visit: Payer: Self-pay

## 2020-08-26 ENCOUNTER — Encounter: Payer: Self-pay | Admitting: Physician Assistant

## 2020-08-26 ENCOUNTER — Ambulatory Visit (INDEPENDENT_AMBULATORY_CARE_PROVIDER_SITE_OTHER): Payer: PPO | Admitting: Physician Assistant

## 2020-08-26 DIAGNOSIS — E039 Hypothyroidism, unspecified: Secondary | ICD-10-CM | POA: Diagnosis not present

## 2020-08-26 DIAGNOSIS — G4700 Insomnia, unspecified: Secondary | ICD-10-CM | POA: Diagnosis not present

## 2020-08-26 DIAGNOSIS — I10 Essential (primary) hypertension: Secondary | ICD-10-CM

## 2020-08-26 DIAGNOSIS — F411 Generalized anxiety disorder: Secondary | ICD-10-CM | POA: Diagnosis not present

## 2020-08-26 DIAGNOSIS — Z79899 Other long term (current) drug therapy: Secondary | ICD-10-CM

## 2020-08-26 DIAGNOSIS — N8110 Cystocele, unspecified: Secondary | ICD-10-CM | POA: Diagnosis not present

## 2020-08-26 LAB — POCT URINE DRUG SCREEN
POC Amphetamine UR: NOT DETECTED
POC BENZODIAZEPINES UR: NOT DETECTED
POC Barbiturate UR: NOT DETECTED
POC Cocaine UR: NOT DETECTED
POC Ecstasy UR: NOT DETECTED
POC Marijuana UR: NOT DETECTED
POC Methadone UR: NOT DETECTED
POC Methamphetamine UR: NOT DETECTED
POC Opiate Ur: NOT DETECTED
POC Oxycodone UR: NOT DETECTED
POC PHENCYCLIDINE UR: NOT DETECTED
POC TRICYCLICS UR: NOT DETECTED

## 2020-08-26 MED ORDER — TRAZODONE HCL 50 MG PO TABS: ORAL_TABLET | ORAL | 2 refills | Status: DC

## 2020-08-26 MED ORDER — ALPRAZOLAM 0.25 MG PO TABS: 0.2500 mg | ORAL_TABLET | Freq: Every evening | ORAL | 0 refills | Status: DC | PRN

## 2020-08-26 NOTE — Progress Notes (Signed)
[facility] [address] [place], [place] [address]  Internal MEDICINE  Office Visit Note  Patient Name: Michelle [name]  [id number]  [id number]  Date of Service: 09/01/2020  Chief Complaint  Patient presents with  . Hypertension  . Hypothyroidism    HPI Pt is here for f/u with no complaints today. -BP high today--pt has been running around all morning. Recheck 140/80. Will monitor at home. -Xanax 1/2 tab at bedtime and it helps her sleep, rarely takes an additional tab for acute anxiety. Tried melatonin gummies and that doesn't do anything. Was originally started on the xanax years ago for family crisis, but that resolved and just really needs help with sleeping and the occasional dose during day. Discussed ttrying an alternative to help with anxiety at night and help her sleep. -She does have hx of bladder problems, referred to Dr. Jerene Pitch and put in pessary donut and it has helped a lot. -Always had hx of constipation and will strain and sometimes push out donut. She was shown how to reinsert, but wants to discuss constipation. Has tried metamucil and other OTC pill to help her go but takes days still. Tried increasing fiber and altering diet without much success. Asking about linzess.  Current Medication: Outpatient Encounter Medications as of 08/26/2020  Medication Sig  . amLODipine (NORVASC) 5 MG tablet Take 1 tablet (5 mg total) by mouth daily.  Marland Kitchen aspirin EC 81 MG tablet Take 81 mg by mouth daily.  Marland Kitchen conjugated estrogens (PREMARIN) vaginal cream 1 gram vaginally nightly at bedtime for 2 weeks, 1 gram vaginally every other night at bedtime for 2 weeks, then 1 gram vaginally twice weekly  . estradiol (ESTRACE) 0.5 MG tablet TAKE 1 TABLET BY MOUTH ONCE DAILY  . fluticasone (FLONASE) 50 MCG/ACT nasal spray Place 1 spray into both nostrils daily as needed.  Marland Kitchen ibuprofen (ADVIL) 800 MG tablet TAKE 1 TABLET BY MOUTH EVERY 8 HOURS AS NEEDED  . levothyroxine (SYNTHROID) 88 MCG  tablet Take 1 tablet (88 mcg total) by mouth daily before breakfast.  . traZODone (DESYREL) 50 MG tablet Take 0.5-1 tablet (25mg -50mg ) by mouth at bedtime  . [DISCONTINUED] ALPRAZolam (XANAX) 0.25 MG tablet Take 1 tablet (0.25 mg total) by mouth at bedtime as needed.  . [DISCONTINUED] nitrofurantoin, macrocrystal-monohydrate, (MACROBID) 100 MG capsule Take 1 capsule (100 mg total) by mouth 2 (two) times daily.  ALPRAZolam (XANAX) 0.25 MG tablet Take 1 tablet (0.25 mg total) by mouth at bedtime as needed.   No facility-administered encounter medications on file as of 08/26/2020.    Surgical History: Past Surgical History:  Procedure Laterality Date  . child birth natural    . diatation      Medical History: Past Medical History:  Diagnosis Date  . Bladder incontinence   . Hypertension   . Hypothyroidism     Family History: Family History  Problem Relation Age of Onset  . Cancer Father   . Hyperlipidemia Father   . Hypertension Father     Social History   Socioeconomic History  . Marital status: Married    Spouse name: Not on file  . Number of children: Not on file  . Years of education: Not on file  . Highest education level: Not on file  Occupational History  . Not on file  Tobacco Use  . Smoking status: Former Smoker    Types: Cigarettes  . Smokeless tobacco: Never Used  Substance and Sexual Activity  . Alcohol use: No  .  Drug use: No  . Sexual activity: Not on file  Other Topics Concern  . Not on file  Social History Narrative  . Not on file   Social Determinants of Health   Financial Resource Strain: Not on file  Food Insecurity: Not on file  Transportation Needs: Not on file  Physical Activity: Not on file  Stress: Not on file  Social Connections: Not on file  Intimate Partner Violence: Not on file      Review of Systems  Constitutional: Negative for chills, fatigue and unexpected weight change.  HENT: Negative for congestion, postnasal drip,  rhinorrhea, sneezing and sore throat.   Eyes: Negative for redness.  Respiratory: Negative for cough, chest tightness and shortness of breath.   Cardiovascular: Negative for chest pain and palpitations.  Gastrointestinal: Positive for constipation. Negative for abdominal pain, blood in stool, diarrhea, nausea and vomiting.  Genitourinary: Negative for dysuria and frequency.       Hx of cystocele with donut placed and helping  Musculoskeletal: Negative for arthralgias, back pain, joint swelling and neck pain.  Skin: Negative for rash.  Neurological: Negative.  Negative for tremors and numbness.  Hematological: Negative for adenopathy. Does not bruise/bleed easily.  Psychiatric/Behavioral: Negative for behavioral problems (Depression), sleep disturbance and suicidal ideas. The patient is not nervous/anxious.     Vital Signs: BP (!) 150/80   Pulse 80   Temp 97.8 F (36.6 C)   Resp 16   Ht 5\' 2"  (1.575 m)   Wt 144 lb (65.3 kg)   SpO2 95%   BMI 26.34 kg/m    Physical Exam Vitals and nursing note reviewed.  Constitutional:      General: She is not in acute distress.    Appearance: She is well-developed. She is not diaphoretic.  HENT:     Head: Normocephalic and atraumatic.     Mouth/Throat:     Pharynx: No oropharyngeal exudate.  Eyes:     Pupils: Pupils are equal, round, and reactive to light.  Neck:     Thyroid: No thyromegaly.     Vascular: No JVD.     Trachea: No tracheal deviation.  Cardiovascular:     Rate and Rhythm: Normal rate and regular rhythm.     Heart sounds: Normal heart sounds. No murmur heard. No friction rub. No gallop.   Pulmonary:     Effort: Pulmonary effort is normal. No respiratory distress.     Breath sounds: No wheezing or rales.  Chest:     Chest wall: No tenderness.  Abdominal:     General: Bowel sounds are normal.     Palpations: Abdomen is soft.  Musculoskeletal:        General: Normal range of motion.     Cervical back: Normal range of  motion and neck supple.  Lymphadenopathy:     Cervical: No cervical adenopathy.  Skin:    General: Skin is warm and dry.  Neurological:     Mental Status: She is alert and oriented to person, place, and time.     Cranial Nerves: No cranial nerve deficit.  Psychiatric:        Behavior: Behavior normal.        Thought Content: Thought content normal.        Judgment: Judgment normal.        Assessment/Plan: 1. Insomnia, unspecified type Will start trazodone nightly, may take 1/2 tab for 1-2 weeks and then can increase to a full tab as needed. Will taper down  xanax--pt already only taking a 1/2 tab and will try to just do trazodone at night and only use the xanax prn for acute anxiety. - ALPRAZolam (XANAX) 0.25 MG tablet; Take 1 tablet (0.25 mg total) by mouth at bedtime as needed.  Dispense: 30 tablet; Refill: 0 - traZODone (DESYREL) 50 MG tablet; Take 0.5-1 tablet (25mg -50mg ) by mouth at bedtime  Dispense: 30 tablet; Refill: 2  2. Generalized anxiety disorder Start trazodone at night to help anxiety and sleep, may take 1/2 tab xanax prn for acute anxiety.  3. Essential hypertension Elevated initially, but improved on recheck--continue current medications and monitor BP at home.  4. Acquired hypothyroidism Continue synthroid  5. Bladder prolapse, female, acquired Followed by OBGYN--pessary donut in place  6. Encounter for long-term (current) use of medications - POCT Urine Drug Screen   General Counseling: Hadleigh verbalizes understanding of the findings of todays visit and agrees with plan of treatment. I have discussed any further diagnostic evaluation that may be needed or ordered today. We also reviewed her medications today. she has been encouraged to call the office with any questions or concerns that should arise related to todays visit.    Orders Placed This Encounter  Procedures  . POCT Urine Drug Screen    Meds ordered this encounter  Medications  .  ALPRAZolam (XANAX) 0.25 MG tablet    Sig: Take 1 tablet (0.25 mg total) by mouth at bedtime as needed.    Dispense:  30 tablet    Refill:  0  . traZODone (DESYREL) 50 MG tablet    Sig: Take 0.5-1 tablet (25mg -50mg ) by mouth at bedtime    Dispense:  30 tablet    Refill:  2    This patient was seen by , PA-C in collaboration with Dr. Scarlette Calico as a part of collaborative care agreement.   Total time spent:30 Minutes Time spent includes review of chart, medications, test results, and follow up plan with the patient.      Dr Internal medicine

## 2020-09-02 ENCOUNTER — Other Ambulatory Visit: Payer: Self-pay | Admitting: Hospice and Palliative Medicine

## 2020-09-02 DIAGNOSIS — M1990 Unspecified osteoarthritis, unspecified site: Secondary | ICD-10-CM

## 2020-09-02 DIAGNOSIS — G4700 Insomnia, unspecified: Secondary | ICD-10-CM

## 2020-09-02 NOTE — Telephone Encounter (Signed)
Please review and send to pharmacy if needed LOV: 08/26/20 NOV: 10/08/20

## 2020-09-15 ENCOUNTER — Encounter: Payer: Self-pay | Admitting: Obstetrics and Gynecology

## 2020-09-17 ENCOUNTER — Ambulatory Visit (INDEPENDENT_AMBULATORY_CARE_PROVIDER_SITE_OTHER): Payer: PPO | Admitting: Obstetrics and Gynecology

## 2020-09-17 ENCOUNTER — Other Ambulatory Visit: Payer: Self-pay

## 2020-09-17 ENCOUNTER — Encounter: Payer: Self-pay | Admitting: Obstetrics and Gynecology

## 2020-09-17 VITALS — BP 122/66 | HR 68 | Ht 62.0 in | Wt 141.0 lb

## 2020-09-17 DIAGNOSIS — N3945 Continuous leakage: Secondary | ICD-10-CM

## 2020-09-17 DIAGNOSIS — N2889 Other specified disorders of kidney and ureter: Secondary | ICD-10-CM | POA: Diagnosis not present

## 2020-09-17 NOTE — Progress Notes (Signed)
HPI:      [name] is a 70 y.o. [phone] who presents today for her pessary follow up and examination related to her pelvic floor weakening.  Pt reports tolerating the pessary well with  no vaginal bleeding and  no vaginal discharge.  Symptoms of pelvic floor weakening have greatly improved. She is voiding and defecating without difficulty. She currently has a 3 donut pessary. She reports she is having spontaneous leakage of urine with sifting of positions or with blowing her nose.  PMHx: She  has a past medical history of Bladder incontinence, Hypertension, and Hypothyroidism. Also,  has a past surgical history that includes child birth natural and diatation., family history includes Cancer in her father; Hyperlipidemia in her father; Hypertension in her father.,  reports that she has quit smoking. Her smoking use included cigarettes. She has never used smokeless tobacco. She reports that she does not drink alcohol and does not use drugs.  She has a current medication list which includes the following prescription(s): alprazolam, amlodipine, aspirin ec, conjugated estrogens, estradiol, fluticasone, ibuprofen, levothyroxine, and trazodone. Also, is allergic to sulfa antibiotics.  Review of Systems  Constitutional: Negative for chills, fever, malaise/fatigue and weight loss.  HENT: Negative for congestion, hearing loss and sinus pain.   Eyes: Negative for blurred vision and double vision.  Respiratory: Negative for cough, sputum production, shortness of breath and wheezing.   Cardiovascular: Negative for chest pain, palpitations, orthopnea and leg swelling.  Gastrointestinal: Negative for abdominal pain, constipation, diarrhea, nausea and vomiting.  Genitourinary: Negative for dysuria, flank pain, frequency, hematuria and urgency.  Musculoskeletal: Negative for back pain, falls and joint pain.  Skin: Negative for itching and rash.  Neurological: Negative for dizziness and headaches.   Psychiatric/Behavioral: Negative for depression, substance abuse and suicidal ideas. The patient is not nervous/anxious.     Objective: BP 122/66 (Cuff Size: Normal)   Pulse 68   Ht 5\' 2"  (1.575 m)   Wt 141 lb (64 kg)   BMI 25.79 kg/m  Physical Exam Constitutional:      Appearance: She is well-developed.  Genitourinary:     Genitourinary Comments: External: Normal appearing vulva. No lesions noted.  Speculum examination:Normal vaginal tissues, no evidence of erosions.    HENT:     Head: Normocephalic and atraumatic.  Neck:     Thyroid: No thyromegaly.  Cardiovascular:     Rate and Rhythm: Normal rate and regular rhythm.     Heart sounds: Normal heart sounds.  Pulmonary:     Effort: Pulmonary effort is normal.     Breath sounds: Normal breath sounds.  Abdominal:     General: Bowel sounds are normal. There is no distension.     Palpations: Abdomen is soft. There is no mass.  Musculoskeletal:     Cervical back: Neck supple.  Neurological:     Mental Status: She is alert and oriented to person, place, and time.  Skin:    General: Skin is warm and dry.  Psychiatric:        Behavior: Behavior normal.        Thought Content: Thought content normal.        Judgment: Judgment normal.  Vitals reviewed.    Pessary Care Pessary removed and cleaned.  Vagina checked - without erosions - pessary replaced.  A/P:  Pessary was cleaned and replaced today. Instructions given for care. Concerning symptoms to observe for are counseled to patient. Follow up scheduled for 3 months.  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 She would like to consider if this is something she would liek to do. Check urine culture today  A total of 20 minutes were spent face-to-face with the patient as well as preparation, review, communication, and  documentation during this encounter.   Adelene Idler MD, [facility], [facility] 09/17/2020 11:36 AM

## 2020-09-17 NOTE — Progress Notes (Signed)
3 month pessary check. Patient complains of more urinary leakage.

## 2020-09-20 ENCOUNTER — Other Ambulatory Visit: Payer: Self-pay | Admitting: Hospice and Palliative Medicine

## 2020-09-20 DIAGNOSIS — M1990 Unspecified osteoarthritis, unspecified site: Secondary | ICD-10-CM

## 2020-09-20 DIAGNOSIS — G4700 Insomnia, unspecified: Secondary | ICD-10-CM

## 2020-09-20 LAB — URINE CULTURE

## 2020-09-20 NOTE — Telephone Encounter (Signed)
Please review and fill if appropriate LOV: 08/26/20 NOV: 10/08/20

## 2020-09-21 ENCOUNTER — Other Ambulatory Visit: Payer: Self-pay | Admitting: Physician Assistant

## 2020-09-23 ENCOUNTER — Ambulatory Visit (INDEPENDENT_AMBULATORY_CARE_PROVIDER_SITE_OTHER): Payer: PPO | Admitting: Physician Assistant

## 2020-09-23 ENCOUNTER — Encounter: Payer: Self-pay | Admitting: Physician Assistant

## 2020-09-23 DIAGNOSIS — R059 Cough, unspecified: Secondary | ICD-10-CM | POA: Diagnosis not present

## 2020-09-23 DIAGNOSIS — J069 Acute upper respiratory infection, unspecified: Secondary | ICD-10-CM

## 2020-09-23 NOTE — Progress Notes (Signed)
[facility] [address] [place], [place] [address]  Internal MEDICINE  Telephone Visit  Patient Name: Michelle [name]  [id number]  [id number]  Date of Service: 09/26/2020  I connected with the patient at 3:18 by telephone and verified the patients identity using two identifiers.   I discussed the limitations, risks, security and privacy concerns of performing an evaluation and management service by telephone and the availability of in person appointments. I also discussed with the patient that there may be a patient responsible charge related to the service.  The patient expressed understanding and agrees to proceed.    Chief Complaint  Patient presents with  . Cough    Dry cough, sound of wheezing in throat and chest with some tightness, had fever last night 101, no nasal congestion, chills early this morning about 5 am.  Symptoms started wed 09/22/20.  Took covid test this morning and was negative  . Telephone Assessment    Line 1...[phone] telephone visit  . Telephone Screen    HPI Pt is here for a virtual acute visit -Started tues or wed with dry cough, cough got worse and is now constant. No drainage or congestion. Chest feels heavy. Has fever and chills. Took a home test this AM and was negative. She has had her 2 COVID vaccinations but not her booster. No sick contacts other than grandbaby who had a cold 2-3 weeks ago.  She also has body aches and headaches. Does have a little wheezing, but no SOB and no sore throat. Staying the same in the past day or two, not getting any better. Tried prednisone to help (had 3-4 tablets at the house from awhile ago). Had some tussionex at home and is taking that at night and is helping with cough and to let her sleep.   Current Medication: Outpatient Encounter Medications as of 09/23/2020  Medication Sig  . ALPRAZolam (XANAX) 0.25 MG tablet TAKE 1 TABLET BY MOUTH AT BEDTIME AS NEEDED  . amLODipine (NORVASC) 5 MG tablet Take 1  tablet (5 mg total) by mouth daily.  Marland Kitchen aspirin EC 81 MG tablet Take 81 mg by mouth daily.  Marland Kitchen conjugated estrogens (PREMARIN) vaginal cream 1 gram vaginally nightly at bedtime for 2 weeks, 1 gram vaginally every other night at bedtime for 2 weeks, then 1 gram vaginally twice weekly  . estradiol (ESTRACE) 0.5 MG tablet TAKE 1 TABLET BY MOUTH ONCE DAILY  . fluticasone (FLONASE) 50 MCG/ACT nasal spray Place 1 spray into both nostrils daily as needed.  Marland Kitchen ibuprofen (ADVIL) 800 MG tablet TAKE 1 TABLET BY MOUTH EVERY 8 HOURS AS NEEDED  . levothyroxine (SYNTHROID) 88 MCG tablet Take 1 tablet (88 mcg total) by mouth daily before breakfast.  . traZODone (DESYREL) 50 MG tablet Take 0.5-1 tablet (25mg -50mg ) by mouth at bedtime   No facility-administered encounter medications on file as of 09/23/2020.    Surgical History: Past Surgical History:  Procedure Laterality Date  . child birth natural    . diatation      Medical History: Past Medical History:  Diagnosis Date  . Bladder incontinence   . Hypertension   . Hypothyroidism     Family History: Family History  Problem Relation Age of Onset  . Cancer Father   . Hyperlipidemia Father   . Hypertension Father     Social History   Socioeconomic History  . Marital status: Married    Spouse name: Not on file  . Number of children: Not on  file  . Years of education: Not on file  . Highest education level: Not on file  Occupational History  . Not on file  Tobacco Use  . Smoking status: Former Smoker    Types: Cigarettes  . Smokeless tobacco: Never Used  Substance and Sexual Activity  . Alcohol use: No  . Drug use: No  . Sexual activity: Not on file  Other Topics Concern  . Not on file  Social History Narrative  . Not on file   Social Determinants of Health   Financial Resource Strain: Not on file  Food Insecurity: Not on file  Transportation Needs: Not on file  Physical Activity: Not on file  Stress: Not on file  Social  Connections: Not on file  Intimate Partner Violence: Not on file      Review of Systems  Constitutional: Positive for chills, fatigue and fever.  HENT: Negative for congestion, mouth sores, postnasal drip and sore throat.   Respiratory: Positive for cough and wheezing. Negative for shortness of breath.   Cardiovascular: Negative for chest pain.  Genitourinary: Negative for flank pain.  Musculoskeletal: Positive for myalgias.  Neurological: Positive for headaches.  Psychiatric/Behavioral: Negative.     Vital Signs: BP 116/78   Temp (!) 97 F (36.1 C)   Resp 16   Ht 5\' 2"  (1.575 m)   Wt 141 lb (64 kg)   BMI 25.79 kg/m    Observation/Objective:  Pt is able to carry out conversation.  Assessment/Plan: 1. Upper respiratory tract infection, unspecified type Recommended she continue Tylenol as needed for headache and body aches.  May also continue test next at night for cough.  If congestion presents patient recommended to utilize Mucinex and Flonase.  Patient also recommended to redo COVID test as original test may have been too early to detect.  Patient advised to isolate at home and to stay well-hydrated.  Patient will call if not improving tomorrow and will consider adding an antibiotic at that time.  2. Cough May continue Tussionex for cough.   General Counseling: Michelle [name] understanding of the findings of today's phone visit and agrees with plan of treatment. I have discussed any further diagnostic evaluation that may be needed or ordered today. We also reviewed her medications today. she has been encouraged to call the office with any questions or concerns that should arise related to todays visit.    No orders of the defined types were placed in this encounter.   No orders of the defined types were placed in this encounter.   Time spent:30 Minutes    Dr Alvia Grove Internal medicine

## 2020-09-24 ENCOUNTER — Other Ambulatory Visit: Payer: Self-pay | Admitting: Physician Assistant

## 2020-09-24 ENCOUNTER — Telehealth: Payer: Self-pay

## 2020-09-24 DIAGNOSIS — J069 Acute upper respiratory infection, unspecified: Secondary | ICD-10-CM

## 2020-09-24 MED ORDER — AZITHROMYCIN 250 MG PO TABS: ORAL_TABLET | ORAL | 0 refills | Status: DC

## 2020-09-24 NOTE — Telephone Encounter (Signed)
Pt called that she not feeling better but her wheezing is better as per lauren advised her we send antibiotic and used flonase and also she can take delsym for cough and drink plenty of water and rest

## 2020-09-29 ENCOUNTER — Ambulatory Visit (INDEPENDENT_AMBULATORY_CARE_PROVIDER_SITE_OTHER): Payer: PPO | Admitting: Obstetrics and Gynecology

## 2020-09-29 ENCOUNTER — Telehealth: Payer: Self-pay

## 2020-09-29 ENCOUNTER — Other Ambulatory Visit: Payer: Self-pay

## 2020-09-29 ENCOUNTER — Other Ambulatory Visit: Payer: Self-pay | Admitting: Obstetrics and Gynecology

## 2020-09-29 ENCOUNTER — Encounter: Payer: Self-pay | Admitting: Obstetrics and Gynecology

## 2020-09-29 VITALS — BP 132/80 | Ht 62.0 in | Wt 141.0 lb

## 2020-09-29 DIAGNOSIS — N3001 Acute cystitis with hematuria: Secondary | ICD-10-CM | POA: Diagnosis not present

## 2020-09-29 DIAGNOSIS — M545 Low back pain, unspecified: Secondary | ICD-10-CM

## 2020-09-29 LAB — POCT URINALYSIS DIPSTICK
Bilirubin, UA: NEGATIVE
Glucose, UA: NEGATIVE
Ketones, UA: NEGATIVE
Nitrite, UA: NEGATIVE
Protein, UA: NEGATIVE
Spec Grav, UA: 1.02 (ref 1.010–1.025)
Urobilinogen, UA: 0.2 E.U./dL
pH, UA: 7 (ref 5.0–8.0)

## 2020-09-29 MED ORDER — CEPHALEXIN 500 MG PO CAPS: 500.0000 mg | ORAL_CAPSULE | Freq: Two times a day (BID) | ORAL | 0 refills | Status: AC

## 2020-09-29 NOTE — Patient Instructions (Signed)
I value your feedback and you entrusting us with your care. If you get a [facility] patient survey, I would appreciate you taking the time to let us know about your experience today. Thank you! ? ? ?

## 2020-09-29 NOTE — Telephone Encounter (Signed)
Pt calling; thinks she has a UTI; has burning and LBP; rx or come in?  [phone]

## 2020-09-29 NOTE — Telephone Encounter (Signed)
Patient is scheduled for 09/29/20 at 3:10 with ABC

## 2020-09-29 NOTE — Progress Notes (Signed)
[name], MD   Chief Complaint  Patient presents with  . Urinary Tract Infection    Urgency, low back pain started today. RM 14    HPI:      [name] is a 70 y.o. [id number] whose LMP was No LMP recorded. Patient has had a hysterectomy., presents today for UTI sx for urinary urgency, frequency, dysuria, LBP, pelvic discomfort since this AM. No hematuria, vag sx, PMB. No fevers/chills. Hx of citrobacter koseri on C&S 12/21. Pt currently on azithro for URI with PCP.    Past Medical History:  Diagnosis Date  . Bladder incontinence   . Hypertension   . Hypothyroidism     Past Surgical History:  Procedure Laterality Date  . child birth natural    . diatation      Family History  Problem Relation Age of Onset  . Cancer Father   . Hyperlipidemia Father   . Hypertension Father     Social History   Socioeconomic History  . Marital status: Married    Spouse name: Not on file  . Number of children: Not on file  . Years of education: Not on file  . Highest education level: Not on file  Occupational History  . Not on file  Tobacco Use  . Smoking status: Former Smoker    Types: Cigarettes  . Smokeless tobacco: Never Used  Substance and Sexual Activity  . Alcohol use: No  . Drug use: No  . Sexual activity: Not Currently    Birth control/protection: Surgical    Comment: Hysterectomy  Other Topics Concern  . Not on file  Social History Narrative  . Not on file   Social Determinants of Health   Financial Resource Strain: Not on file  Food Insecurity: Not on file  Transportation Needs: Not on file  Physical Activity: Not on file  Stress: Not on file  Social Connections: Not on file  Intimate Partner Violence: Not on file    Outpatient Medications Prior to Visit  Medication Sig Dispense Refill  . amLODipine (NORVASC) 5 MG tablet Take 1 tablet (5 mg total) by mouth daily. 90 tablet 1  . aspirin EC 81 MG tablet Take 81 mg by mouth daily.    Marland Kitchen  azithromycin (ZITHROMAX) 250 MG tablet Take one tab a day for 10 days for uri 10 tablet 0  . conjugated estrogens (PREMARIN) vaginal cream 1 gram vaginally nightly at bedtime for 2 weeks, 1 gram vaginally every other night at bedtime for 2 weeks, then 1 gram vaginally twice weekly 30 g 6  . estradiol (ESTRACE) 0.5 MG tablet TAKE 1 TABLET BY MOUTH ONCE DAILY 90 tablet 1  . levothyroxine (SYNTHROID) 88 MCG tablet Take 1 tablet (88 mcg total) by mouth daily before breakfast. 90 tablet 0  . traZODone (DESYREL) 50 MG tablet Take 0.5-1 tablet (25mg -50mg ) by mouth at bedtime 30 tablet 2  . ALPRAZolam (XANAX) 0.25 MG tablet TAKE 1 TABLET BY MOUTH AT BEDTIME AS NEEDED (Patient not taking: Reported on 09/29/2020) 30 tablet 0  . fluticasone (FLONASE) 50 MCG/ACT nasal spray Place 1 spray into both nostrils daily as needed. 16 g 3  . ibuprofen (ADVIL) 800 MG tablet TAKE 1 TABLET BY MOUTH EVERY 8 HOURS AS NEEDED 30 tablet 0   No facility-administered medications prior to visit.     ROS:  Review of Systems  Constitutional: Negative for fever.  Gastrointestinal: Negative for blood in stool, constipation, diarrhea, nausea and vomiting.  Genitourinary: Positive for dysuria, frequency and urgency. Negative for dyspareunia, flank pain, hematuria, vaginal bleeding, vaginal discharge and vaginal pain.  Musculoskeletal: Positive for back pain.  Skin: Negative for rash.    OBJECTIVE:   Vitals:  BP 132/80   Ht 5\' 2"  (1.575 m)   Wt 141 lb (64 kg)   BMI 25.79 kg/m   Physical Exam Vitals reviewed.  Constitutional:      Appearance: She is well-developed. She is not ill-appearing or toxic-appearing.  Pulmonary:     Effort: Pulmonary effort is normal.  Abdominal:     Tenderness: There is right CVA tenderness. There is no left CVA tenderness.  Musculoskeletal:        General: Normal range of motion.     Cervical back: Normal range of motion.  Neurological:     General: No focal deficit present.      Mental Status: She is alert and oriented to person, place, and time.     Cranial Nerves: No cranial nerve deficit.  Psychiatric:        Behavior: Behavior normal.        Thought Content: Thought content normal.        Judgment: Judgment normal.     Results: Results for orders placed or performed in visit on 09/29/20 (from the past 24 hour(s))  POCT Urinalysis Dipstick     Status: Abnormal   Collection Time: 09/29/20  3:14 PM  Result Value Ref Range   Color, UA     Clarity, UA     Glucose, UA Negative Negative   Bilirubin, UA Negative    Ketones, UA Negative    Spec Grav, UA 1.020 1.010 - 1.025   Blood, UA +    pH, UA 7.0 5.0 - 8.0   Protein, UA Negative Negative   Urobilinogen, UA 0.2 0.2 or 1.0 E.U./dL   Nitrite, UA Negative    Leukocytes, UA Large (3+) (A) Negative   Appearance     Odor       Assessment/Plan: Acute cystitis with hematuria - Plan: POCT Urinalysis Dipstick, cephALEXin (KEFLEX) 500 MG capsule, Urine Culture; pos sx and UA. Rx keflex. Check C&S. F/u prn/sooner prn fevers, severe back pain. Minimal RT CVAT today, no fevers.  Low back pain, unspecified back pain laterality, unspecified chronicity, unspecified whether sciatica present - Plan: POCT Urinalysis Dipstick   Meds ordered this encounter  Medications  . cephALEXin (KEFLEX) 500 MG capsule    Sig: Take 1 capsule (500 mg total) by mouth 2 (two) times daily for 7 days.    Dispense:  14 capsule    Refill:  0    Order Specific Question:   Supervising Provider    Answer:   11/29/20 Nadara Mustard      Return if symptoms worsen or fail to improve.  [name] B. Eldra Word, PA-C 09/29/2020 3:53 PM

## 2020-10-06 LAB — URINE CULTURE

## 2020-10-08 ENCOUNTER — Other Ambulatory Visit: Payer: Self-pay

## 2020-10-08 ENCOUNTER — Encounter: Payer: Self-pay | Admitting: Physician Assistant

## 2020-10-08 ENCOUNTER — Ambulatory Visit (INDEPENDENT_AMBULATORY_CARE_PROVIDER_SITE_OTHER): Payer: PPO | Admitting: Physician Assistant

## 2020-10-08 DIAGNOSIS — R300 Dysuria: Secondary | ICD-10-CM | POA: Diagnosis not present

## 2020-10-08 DIAGNOSIS — F411 Generalized anxiety disorder: Secondary | ICD-10-CM | POA: Diagnosis not present

## 2020-10-08 DIAGNOSIS — K5900 Constipation, unspecified: Secondary | ICD-10-CM

## 2020-10-08 DIAGNOSIS — I10 Essential (primary) hypertension: Secondary | ICD-10-CM

## 2020-10-08 DIAGNOSIS — G4700 Insomnia, unspecified: Secondary | ICD-10-CM | POA: Diagnosis not present

## 2020-10-08 DIAGNOSIS — E039 Hypothyroidism, unspecified: Secondary | ICD-10-CM | POA: Diagnosis not present

## 2020-10-08 LAB — POCT URINALYSIS DIPSTICK
Bilirubin, UA: NEGATIVE
Glucose, UA: NEGATIVE
Nitrite, UA: 2
Protein, UA: POSITIVE — AB
Spec Grav, UA: 1.015 (ref 1.010–1.025)
Urobilinogen, UA: 0.2 E.U./dL
pH, UA: 6 (ref 5.0–8.0)

## 2020-10-08 MED ORDER — MIRTAZAPINE 15 MG PO TBDP: 15.0000 mg | ORAL_TABLET | Freq: Every day | ORAL | 2 refills | Status: DC

## 2020-10-08 MED ORDER — NITROFURANTOIN MONOHYD MACRO 100 MG PO CAPS: ORAL_CAPSULE | ORAL | 0 refills | Status: DC

## 2020-10-08 NOTE — Progress Notes (Signed)
[facility] [address] [place], [place] [address]  Internal MEDICINE  Office Visit Note  Patient Name: Michelle [name]  [id number]  [id number]  Date of Service: 10/08/2020  Chief Complaint  Patient presents with  . Follow-up    UTI, burning when urinating, pain, urine frequency and urgency, not voiding a lot at a time, lower back pain, pressure in pelvic area  . Hypertension  . Quality Metric Gaps    mammogram    HPI Pt is here for routine follow up -Started having UTI symptoms and went to OBGYN and was started on Keflex and did well until she stopped it and then last night came back. Having burning with urination, urinary frequency and urgency, low back pain. -Recently recovered from covid-like symptoms but tested negative. -She is taking a 1/2 tab xanax to help her anxiety when she goes to bed, trazodone made her drowsy initially but then was wide awake. Tried doing a full tab and was even more awake so stopped. Open to trying an alternative.  -Tried lowest dose sample of linzess and didn't make a difference, will try samples of 145 (8tablets given) and will call if she wants script.  Current Medication: Outpatient Encounter Medications as of 10/08/2020  Medication Sig  . mirtazapine (REMERON SOL-TAB) 15 MG disintegrating tablet Take 1 tablet (15 mg total) by mouth at bedtime.  . nitrofurantoin, macrocrystal-monohydrate, (MACROBID) 100 MG capsule Take 1 cap twice per day for 10 days.  . ALPRAZolam (XANAX) 0.25 MG tablet TAKE 1 TABLET BY MOUTH AT BEDTIME AS NEEDED (Patient not taking: Reported on 09/29/2020)  . amLODipine (NORVASC) 5 MG tablet Take 1 tablet (5 mg total) by mouth daily.  Marland Kitchen aspirin EC 81 MG tablet Take 81 mg by mouth daily.  Marland Kitchen azithromycin (ZITHROMAX) 250 MG tablet Take one tab a day for 10 days for uri  . conjugated estrogens (PREMARIN) vaginal cream 1 gram vaginally nightly at bedtime for 2 weeks, 1 gram vaginally every other night at bedtime for 2  weeks, then 1 gram vaginally twice weekly  . estradiol (ESTRACE) 0.5 MG tablet TAKE 1 TABLET BY MOUTH ONCE DAILY  . fluticasone (FLONASE) 50 MCG/ACT nasal spray Place 1 spray into both nostrils daily as needed.  Marland Kitchen ibuprofen (ADVIL) 800 MG tablet TAKE 1 TABLET BY MOUTH EVERY 8 HOURS AS NEEDED  . levothyroxine (SYNTHROID) 88 MCG tablet Take 1 tablet (88 mcg total) by mouth daily before breakfast.  . traZODone (DESYREL) 50 MG tablet Take 0.5-1 tablet (25mg -50mg ) by mouth at bedtime   No facility-administered encounter medications on file as of 10/08/2020.    Surgical History: Past Surgical History:  Procedure Laterality Date  . child birth natural    . diatation      Medical History: Past Medical History:  Diagnosis Date  . Bladder incontinence   . Hypertension   . Hypothyroidism     Family History: Family History  Problem Relation Age of Onset  . Cancer Father   . Hyperlipidemia Father   . Hypertension Father     Social History   Socioeconomic History  . Marital status: Married    Spouse name: Not on file  . Number of children: Not on file  . Years of education: Not on file  . Highest education level: Not on file  Occupational History  . Not on file  Tobacco Use  . Smoking status: Former Smoker    Types: Cigarettes  . Smokeless tobacco: Never Used  Substance and  Sexual Activity  . Alcohol use: No  . Drug use: No  . Sexual activity: Not Currently    Birth control/protection: Surgical    Comment: Hysterectomy  Other Topics Concern  . Not on file  Social History Narrative  . Not on file   Social Determinants of Health   Financial Resource Strain: Not on file  Food Insecurity: Not on file  Transportation Needs: Not on file  Physical Activity: Not on file  Stress: Not on file  Social Connections: Not on file  Intimate Partner Violence: Not on file      Review of Systems  Constitutional: Negative for chills, fatigue and unexpected weight change.  HENT:  Negative for congestion, postnasal drip, rhinorrhea, sneezing and sore throat.   Eyes: Negative for redness.  Respiratory: Negative for cough, chest tightness and shortness of breath.   Cardiovascular: Negative for chest pain and palpitations.  Gastrointestinal: Positive for constipation. Negative for abdominal pain, diarrhea, nausea and vomiting.  Genitourinary: Positive for dysuria, flank pain, frequency and urgency.  Musculoskeletal: Negative for arthralgias, joint swelling and neck pain.  Skin: Negative for rash.  Neurological: Negative.  Negative for tremors and numbness.  Hematological: Negative for adenopathy. Does not bruise/bleed easily.  Psychiatric/Behavioral: Negative for behavioral problems (Depression), sleep disturbance and suicidal ideas. The patient is not nervous/anxious.     Vital Signs: BP 130/78   Pulse 85   Temp (!) 97.4 F (36.3 C)   Resp 16   Ht 5\' 2"  (1.575 m)   Wt 137 lb 9.6 oz (62.4 kg)   SpO2 97%   BMI 25.17 kg/m    Physical Exam Vitals and nursing note reviewed.  Constitutional:      General: She is not in acute distress.    Appearance: She is well-developed. She is not diaphoretic.  HENT:     Head: Normocephalic and atraumatic.     Mouth/Throat:     Pharynx: No oropharyngeal exudate.  Eyes:     Pupils: Pupils are equal, round, and reactive to light.  Neck:     Thyroid: No thyromegaly.     Vascular: No JVD.     Trachea: No tracheal deviation.  Cardiovascular:     Rate and Rhythm: Normal rate and regular rhythm.     Heart sounds: Normal heart sounds. No murmur heard. No friction rub. No gallop.   Pulmonary:     Effort: Pulmonary effort is normal. No respiratory distress.     Breath sounds: No wheezing or rales.  Chest:     Chest wall: No tenderness.  Abdominal:     General: Bowel sounds are normal.     Palpations: Abdomen is soft.     Tenderness: There is right CVA tenderness.  Musculoskeletal:        General: Normal range of motion.      Cervical back: Normal range of motion and neck supple.  Lymphadenopathy:     Cervical: No cervical adenopathy.  Skin:    General: Skin is warm and dry.  Neurological:     Mental Status: She is alert and oriented to person, place, and time.     Cranial Nerves: No cranial nerve deficit.  Psychiatric:        Behavior: Behavior normal.        Thought Content: Thought content normal.        Judgment: Judgment normal.        Assessment/Plan: 1. Generalized anxiety disorder Start mirtazapine at night May continue half tab of  Xanax as needed  2. Insomnia, unspecified type Stop trazodone. Will start mirtazapine at night to help with insomnia, patient may continue half tablet of Xanax as needed - mirtazapine (REMERON SOL-TAB) 15 MG disintegrating tablet; Take 1 tablet (15 mg total) by mouth at bedtime.  Dispense: 30 tablet; Refill: 2  3. Constipation, unspecified constipation type Is with Linzess 145 given in office today, patient will call if she would like a prescription if beneficial  4. Essential hypertension Stable continue current medications  5. Acquired hypothyroidism Stable continue Synthroid, will need to update labs next visit  6. Dysuria Based on recent culture performed at OB/GYN will begin on Macrobid, we will also send new urine culture and adjust as needed - POCT Urinalysis Dipstick - CULTURE, URINE COMPREHENSIVE - nitrofurantoin, macrocrystal-monohydrate, (MACROBID) 100 MG capsule; Take 1 cap twice per day for 10 days.  Dispense: 20 capsule; Refill: 0   General Counseling: ivorie uplinger understanding of the findings of todays visit and agrees with plan of treatment. I have discussed any further diagnostic evaluation that may be needed or ordered today. We also reviewed her medications today. she has been encouraged to call the office with any questions or concerns that should arise related to todays visit.    Orders Placed This Encounter  Procedures  .  CULTURE, URINE COMPREHENSIVE  . POCT Urinalysis Dipstick    Meds ordered this encounter  Medications  . nitrofurantoin, macrocrystal-monohydrate, (MACROBID) 100 MG capsule    Sig: Take 1 cap twice per day for 10 days.    Dispense:  20 capsule    Refill:  0  . mirtazapine (REMERON SOL-TAB) 15 MG disintegrating tablet    Sig: Take 1 tablet (15 mg total) by mouth at bedtime.    Dispense:  30 tablet    Refill:  2    This patient was seen by Lynn Ito, PA-C in collaboration with Dr. Beverely Risen as a part of collaborative care agreement.   Total time spent:30 Minutes Time spent includes review of chart, medications, test results, and follow up plan with the patient.      Dr [name] Internal medicine

## 2020-10-12 LAB — CULTURE, URINE COMPREHENSIVE

## 2020-10-27 ENCOUNTER — Other Ambulatory Visit: Payer: Self-pay

## 2020-10-27 ENCOUNTER — Telehealth: Payer: Self-pay

## 2020-10-27 DIAGNOSIS — R300 Dysuria: Secondary | ICD-10-CM

## 2020-10-27 MED ORDER — NITROFURANTOIN MONOHYD MACRO 100 MG PO CAPS: ORAL_CAPSULE | ORAL | 0 refills | Status: AC

## 2020-10-27 NOTE — Telephone Encounter (Signed)
As per dr Welton Flakes pt advised that we send 2 more week antibiotic to [facility] for UTI

## 2020-11-01 ENCOUNTER — Telehealth: Payer: Self-pay | Admitting: Internal Medicine

## 2020-11-01 NOTE — Chronic Care Management (AMB) (Signed)
  Chronic Care Management   Outreach Note  11/01/2020 Name: Michelle [name] MRN: [id number] DOB: 14-May-1951  Referred by: [name], MD Reason for referral : No chief complaint on file.   An unsuccessful telephone outreach was attempted today. The patient was referred to the pharmacist for assistance with care management and care coordination.   Follow Up Plan:   Tatjana Dellinger Upstream Scheduler

## 2020-11-10 ENCOUNTER — Ambulatory Visit (INDEPENDENT_AMBULATORY_CARE_PROVIDER_SITE_OTHER): Payer: PPO | Admitting: Urology

## 2020-11-10 ENCOUNTER — Encounter: Payer: Self-pay | Admitting: Urology

## 2020-11-10 ENCOUNTER — Other Ambulatory Visit: Payer: Self-pay

## 2020-11-10 VITALS — BP 149/80 | HR 77 | Ht 62.0 in | Wt 140.0 lb

## 2020-11-10 DIAGNOSIS — N390 Urinary tract infection, site not specified: Secondary | ICD-10-CM

## 2020-11-10 DIAGNOSIS — R3915 Urgency of urination: Secondary | ICD-10-CM | POA: Diagnosis not present

## 2020-11-10 DIAGNOSIS — N1330 Unspecified hydronephrosis: Secondary | ICD-10-CM | POA: Diagnosis not present

## 2020-11-10 LAB — BLADDER SCAN AMB NON-IMAGING

## 2020-11-10 MED ORDER — NITROFURANTOIN MACROCRYSTAL 50 MG PO CAPS: ORAL_CAPSULE | ORAL | 0 refills | Status: DC

## 2020-11-10 NOTE — Patient Instructions (Signed)
Urinary Tract Infection, Adult A urinary tract infection (UTI) is an infection of any part of the urinary tract. The urinary tract includes the kidneys, ureters, bladder, and urethra. These organs make, store, and get rid of urine in the body. An upper UTI affects the ureters and kidneys. A lower UTI affects the bladder and urethra. What are the causes? Most urinary tract infections are caused by bacteria in your genital area around your urethra, where urine leaves your body. These bacteria grow and cause inflammation of your urinary tract. What increases the risk? You are more likely to develop this condition if: You have a urinary catheter that stays in place. You are not able to control when you urinate or have a bowel movement (incontinence). You are female and you: Use a spermicide or diaphragm for birth control. Have low estrogen levels. Are pregnant. You have certain genes that increase your risk. You are sexually active. You take antibiotic medicines. You have a condition that causes your flow of urine to slow down, such as: An enlarged prostate, if you are female. Blockage in your urethra. A kidney stone. A nerve condition that affects your bladder control (neurogenic bladder). Not getting enough to drink, or not urinating often. You have certain medical conditions, such as: Diabetes. A weak disease-fighting system (immunesystem). Sickle cell disease. Gout. Spinal cord injury. What are the signs or symptoms? Symptoms of this condition include: Needing to urinate right away (urgency). Frequent urination. This may include small amounts of urine each time you urinate. Pain or burning with urination. Blood in the urine. Urine that smells bad or unusual. Trouble urinating. Cloudy urine. Vaginal discharge, if you are female. Pain in the abdomen or the lower back. You may also have: Vomiting or a decreased appetite. Confusion. Irritability or tiredness. A fever or  chills. Diarrhea. The first symptom in older adults may be confusion. In some cases, they may not have any symptoms until the infection has worsened. How is this diagnosed? This condition is diagnosed based on your medical history and a physical exam. You may also have other tests, including: Urine tests. Blood tests. Tests for STIs (sexually transmitted infections). If you have had more than one UTI, a cystoscopy or imaging studies may be done to determine the cause of the infections. How is this treated? Treatment for this condition includes: Antibiotic medicine. Over-the-counter medicines to treat discomfort. Drinking enough water to stay hydrated. If you have frequent infections or have other conditions such as a kidney stone, you may need to see a health care provider who specializes in the urinary tract (urologist). In rare cases, urinary tract infections can cause sepsis. Sepsis is a life-threatening condition that occurs when the body responds to an infection. Sepsis is treated in the hospital with IV antibiotics, fluids, and other medicines. Follow these instructions at home: Medicines Take over-the-counter and prescription medicines only as told by your health care provider. If you were prescribed an antibiotic medicine, take it as told by your health care provider. Do not stop using the antibiotic even if you start to feel better. General instructions Make sure you: Empty your bladder often and completely. Do not hold urine for long periods of time. Empty your bladder after sex. Wipe from front to back after urinating or having a bowel movement if you are female. Use each tissue only one time when you wipe. Drink enough fluid to keep your urine pale yellow. Keep all follow-up visits. This is important. Contact a health care provider   if: Your symptoms do not get better after 1-2 days. Your symptoms go away and then return. Get help right away if: You have severe pain in your  back or your lower abdomen. You have a fever or chills. You have nausea or vomiting. Summary A urinary tract infection (UTI) is an infection of any part of the urinary tract, which includes the kidneys, ureters, bladder, and urethra. Most urinary tract infections are caused by bacteria in your genital area. Treatment for this condition often includes antibiotic medicines. If you were prescribed an antibiotic medicine, take it as told by your health care provider. Do not stop using the antibiotic even if you start to feel better. Keep all follow-up visits. This is important. This information is not intended to replace advice given to you by your health care provider. Make sure you discuss any questions you have with your health care provider. Document Revised: 12/19/2019 Document Reviewed: 12/19/2019 Elsevier Patient Education  2022 Elsevier Inc[employer]  

## 2020-11-10 NOTE — Progress Notes (Signed)
   11/10/2020 8:57 AM   Michelle [name] November 19, 1950 [id number]  Reason for visit: Follow up recurrent UTIs, chronic right hydroureteronephrosis  HPI: I saw Michelle [name] back in clinic for the above issues.  She is a 71 year old female who I last saw in August 2021 for her chronic right hydronephrosis.  This has been present since at least 2012, and was likely secondary to a stone episode at that time with residual scar tissue at the UVJ.  A NM renal scan was ordered and showed 61% function on the left and 39% function on the right with no evidence of high-grade obstructive uropathy on either side, and renal function was normal and she deferred any further evaluation or work-up.  She has had 3-4 UTIs over the last 6 months.  She also has been followed by GYN and has had a pessary, and was started on Premarin cream about 6 months ago.  PVR is normal today at 0 mL, and urinalysis is totally benign.  Her symptoms with UTIs or pelvic pain and pressure, dysuria, and right-sided pressure/flank pain.  I personally viewed and interpreted the prior CT that shows no evidence of ureteral stones and chronic right hydronephrosis.  We discussed the evaluation and treatment of patients with recurrent UTIs at length.  We specifically discussed the differences between asymptomatic bacteriuria and true urinary tract infection.  We discussed the AUA definition of recurrent UTI of at least 2 culture proven symptomatic acute cystitis episodes in a 50-month period, or 3 within a 1 year period.  We discussed the importance of culture directed antibiotic treatment, and antibiotic stewardship.  First-line therapy includes nitrofurantoin(5 days), Bactrim(3 days), or fosfomycin(3 g single dose).  Possible etiologies of recurrent infection include periurethral tissue atrophy in postmenopausal woman, constipation, sexual activity, incomplete emptying, anatomic abnormalities, and even genetic predisposition.  Finally, we discussed the  role of perineal hygiene, timed voiding, adequate hydration, topical vaginal estrogen, cranberry prophylaxis, and low-dose antibiotic prophylaxis.  We also discussed that her right-sided hydroureteronephrosis may be contributing to her recurrent infections.  I recommended taking cranberry tablets twice daily, managing constipation, and a 55-month trial of nitrofurantoin 50 mg daily prophylaxis.  We also discussed options like cystoscopy or right retrograde pyelogram or even balloon dilation of the right ureteral tunnel or stent placement, but she would like to defer any invasive procedures at this time.  We discussed return precautions extensively.  Nitrofurantoin 50 mg daily prophylaxis x3 months RTC 3 months symptom check Rediscuss  cystoscopy, right retrograde pyelogram, right distal ureteral balloon dilation, stent placement if recurrent infections despite above strategies   Sondra Come, MD  [facility] [address] [place], [place] [address] [phone]

## 2020-11-11 LAB — MICROSCOPIC EXAMINATION: Epithelial Cells (non renal): >10 /hpf — ABNORMAL HIGH (ref 0–10)

## 2020-11-11 LAB — URINALYSIS, COMPLETE
Bilirubin, UA: NEGATIVE
Glucose, UA: NEGATIVE
Ketones, UA: NEGATIVE
Leukocytes,UA: NEGATIVE
Nitrite, UA: NEGATIVE
Protein,UA: NEGATIVE
Specific Gravity, UA: 1.02 (ref 1.005–1.030)
Urobilinogen, Ur: 0.2 mg/dL (ref 0.2–1.0)
pH, UA: 6 (ref 5.0–7.5)

## 2020-11-18 ENCOUNTER — Telehealth: Payer: Self-pay | Admitting: Internal Medicine

## 2020-11-18 NOTE — Chronic Care Management (AMB) (Signed)
  Chronic Care Management   Outreach Note  11/18/2020 Name: Michelle [name] MRN: [id number] DOB: [birth year]  Referred by: [name], MD Reason for referral : No chief complaint on file.   A second unsuccessful telephone outreach was attempted today. The patient was referred to pharmacist for assistance with care management and care coordination.  Follow Up Plan:   Tatjana Dellinger Upstream Scheduler

## 2020-11-30 ENCOUNTER — Telehealth: Payer: Self-pay | Admitting: Internal Medicine

## 2020-11-30 NOTE — Chronic Care Management (AMB) (Signed)
  Chronic Care Management   Outreach Note  11/30/2020 Name: ANORAH TRIAS MRN: [id number] DOB: 05/12/1951  Referred by: [name], MD Reason for referral : No chief complaint on file.   A second unsuccessful telephone outreach was attempted today. The patient was referred to pharmacist for assistance with care management and care coordination.  Follow Up Plan:   Tatjana Dellinger Upstream Scheduler

## 2020-12-06 DIAGNOSIS — H5203 Hypermetropia, bilateral: Secondary | ICD-10-CM | POA: Diagnosis not present

## 2020-12-06 DIAGNOSIS — H2513 Age-related nuclear cataract, bilateral: Secondary | ICD-10-CM | POA: Diagnosis not present

## 2020-12-06 DIAGNOSIS — H0288B Meibomian gland dysfunction left eye, upper and lower eyelids: Secondary | ICD-10-CM | POA: Diagnosis not present

## 2020-12-06 DIAGNOSIS — H0288A Meibomian gland dysfunction right eye, upper and lower eyelids: Secondary | ICD-10-CM | POA: Diagnosis not present

## 2020-12-06 DIAGNOSIS — H40013 Open angle with borderline findings, low risk, bilateral: Secondary | ICD-10-CM | POA: Diagnosis not present

## 2020-12-20 ENCOUNTER — Ambulatory Visit (INDEPENDENT_AMBULATORY_CARE_PROVIDER_SITE_OTHER): Payer: PPO | Admitting: Physician Assistant

## 2020-12-20 ENCOUNTER — Other Ambulatory Visit: Payer: Self-pay

## 2020-12-20 ENCOUNTER — Encounter: Payer: Self-pay | Admitting: Physician Assistant

## 2020-12-20 DIAGNOSIS — E039 Hypothyroidism, unspecified: Secondary | ICD-10-CM | POA: Diagnosis not present

## 2020-12-20 DIAGNOSIS — F411 Generalized anxiety disorder: Secondary | ICD-10-CM

## 2020-12-20 DIAGNOSIS — G4700 Insomnia, unspecified: Secondary | ICD-10-CM

## 2020-12-20 DIAGNOSIS — I10 Essential (primary) hypertension: Secondary | ICD-10-CM

## 2020-12-20 DIAGNOSIS — R5383 Other fatigue: Secondary | ICD-10-CM

## 2020-12-20 DIAGNOSIS — M1990 Unspecified osteoarthritis, unspecified site: Secondary | ICD-10-CM

## 2020-12-20 DIAGNOSIS — K5900 Constipation, unspecified: Secondary | ICD-10-CM

## 2020-12-20 DIAGNOSIS — Z1231 Encounter for screening mammogram for malignant neoplasm of breast: Secondary | ICD-10-CM

## 2020-12-20 DIAGNOSIS — E782 Mixed hyperlipidemia: Secondary | ICD-10-CM

## 2020-12-20 DIAGNOSIS — E559 Vitamin D deficiency, unspecified: Secondary | ICD-10-CM | POA: Diagnosis not present

## 2020-12-20 MED ORDER — IBUPROFEN 800 MG PO TABS: 800.0000 mg | ORAL_TABLET | Freq: Three times a day (TID) | ORAL | 0 refills | Status: DC | PRN

## 2020-12-20 MED ORDER — TRAZODONE HCL 50 MG PO TABS: ORAL_TABLET | ORAL | 2 refills | Status: DC

## 2020-12-20 NOTE — Progress Notes (Signed)
[facility] [address] [place], [place] [address]  Internal MEDICINE  Office Visit Note  Patient Name: Michelle [name]  [id number]  [id number]  Date of Service: 12/26/2020  Chief Complaint  Patient presents with   Anxiety   Constipation   Insomnia   Quality Metric Gaps    Pt needs to be scheduled for mammogram    HPI Pt is here for routine follow up. -Mirtazpaine knocked her out and had lasting grogginess until noon. So stopped this and went back to trazodone, taking 1/2 tab and it seems to be working better. Will skip some nights if she is very sleepy.  -Saw urology and was put on prophylaxis of macrobid. Has been doing well since this. -Tried linzess samples, but doesn't know if it helped. Taking metamucil but it gives her gas. Started doing gummies instead, but still takes a few days. Drinking a lot of water. -Sample of Linzess given x3 boxes -takes ibuprofen as need for pain in shoulders -Will try mucinex and saline nasal spray due to onset of mild ear pain/pressure and concern for sinuses starting to get congested -given lab slip to be completed prior to next visit's CPE  Current Medication: Outpatient Encounter Medications as of 12/20/2020  Medication Sig   amLODipine (NORVASC) 5 MG tablet Take 1 tablet (5 mg total) by mouth daily.   aspirin EC 81 MG tablet Take 81 mg by mouth daily.   conjugated estrogens (PREMARIN) vaginal cream 1 gram vaginally nightly at bedtime for 2 weeks, 1 gram vaginally every other night at bedtime for 2 weeks, then 1 gram vaginally twice weekly   estradiol (ESTRACE) 0.5 MG tablet TAKE 1 TABLET BY MOUTH ONCE DAILY   fluticasone (FLONASE) 50 MCG/ACT nasal spray Place 1 spray into both nostrils daily as needed.   levothyroxine (SYNTHROID) 88 MCG tablet Take 1 tablet (88 mcg total) by mouth daily before breakfast.   nitrofurantoin (MACRODANTIN) 50 MG capsule Take one capsule by mouth daily for 3 months   [DISCONTINUED] ibuprofen  (ADVIL) 800 MG tablet TAKE 1 TABLET BY MOUTH EVERY 8 HOURS AS NEEDED   [DISCONTINUED] traZODone (DESYREL) 50 MG tablet Take 0.5-1 tablet (25mg -50mg ) by mouth at bedtime   ibuprofen (ADVIL) 800 MG tablet Take 1 tablet (800 mg total) by mouth every 8 (eight) hours as needed.   traZODone (DESYREL) 50 MG tablet Take 0.5-1 tablet (25mg -50mg ) by mouth at bedtime   [DISCONTINUED] mirtazapine (REMERON SOL-TAB) 15 MG disintegrating tablet Take 1 tablet (15 mg total) by mouth at bedtime. (Patient not taking: Reported on 12/20/2020)   No facility-administered encounter medications on file as of 12/20/2020.    Surgical History: Past Surgical History:  Procedure Laterality Date   child birth natural     diatation      Medical History: Past Medical History:  Diagnosis Date   Bladder incontinence    Hypertension    Hypothyroidism     Family History: Family History  Problem Relation Age of Onset   Cancer Father    Hyperlipidemia Father    Hypertension Father     Social History   Socioeconomic History   Marital status: Married    Spouse name: Not on file   Number of children: Not on file   Years of education: Not on file   Highest education level: Not on file  Occupational History   Not on file  Tobacco Use   Smoking status: Former    Types: Cigarettes   Smokeless tobacco:  Never  Substance and Sexual Activity   Alcohol use: No   Drug use: No   Sexual activity: Not Currently    Birth control/protection: Surgical    Comment: Hysterectomy  Other Topics Concern   Not on file  Social History Narrative   Not on file   Social Determinants of Health   Financial Resource Strain: Not on file  Food Insecurity: Not on file  Transportation Needs: Not on file  Physical Activity: Not on file  Stress: Not on file  Social Connections: Not on file  Intimate Partner Violence: Not on file      Review of Systems  Constitutional:  Negative for chills, fatigue and unexpected weight change.   HENT:  Positive for ear pain and postnasal drip. Negative for congestion, rhinorrhea, sneezing and sore throat.   Eyes:  Negative for redness.  Respiratory:  Negative for cough, chest tightness and shortness of breath.   Cardiovascular:  Negative for chest pain and palpitations.  Gastrointestinal:  Positive for constipation. Negative for abdominal pain, diarrhea, nausea and vomiting.  Genitourinary:  Negative for dysuria and frequency.  Musculoskeletal:  Negative for arthralgias, back pain, joint swelling and neck pain.  Skin:  Negative for rash.  Neurological: Negative.  Negative for tremors and numbness.  Hematological:  Negative for adenopathy. Does not bruise/bleed easily.  Psychiatric/Behavioral:  Positive for sleep disturbance. Negative for behavioral problems (Depression) and suicidal ideas. The patient is not nervous/anxious.    Vital Signs: BP 130/74   Pulse 81   Temp 98.3 F (36.8 C)   Resp 16   Ht 5\' 2"  (1.575 m)   Wt 141 lb 12.8 oz (64.3 kg)   SpO2 97%   BMI 25.94 kg/m    Physical Exam Vitals and nursing note reviewed.  Constitutional:      General: She is not in acute distress.    Appearance: She is well-developed and normal weight. She is not diaphoretic.  HENT:     Head: Normocephalic and atraumatic.     Mouth/Throat:     Pharynx: No oropharyngeal exudate.  Eyes:     Pupils: Pupils are equal, round, and reactive to light.  Neck:     Thyroid: No thyromegaly.     Vascular: No JVD.     Trachea: No tracheal deviation.  Cardiovascular:     Rate and Rhythm: Normal rate and regular rhythm.     Heart sounds: Normal heart sounds. No murmur heard.   No friction rub. No gallop.  Pulmonary:     Effort: Pulmonary effort is normal. No respiratory distress.     Breath sounds: No wheezing or rales.  Chest:     Chest wall: No tenderness.  Abdominal:     General: Bowel sounds are normal.     Palpations: Abdomen is soft.  Musculoskeletal:        General: Normal  range of motion.     Cervical back: Normal range of motion and neck supple.  Lymphadenopathy:     Cervical: No cervical adenopathy.  Skin:    General: Skin is warm and dry.  Neurological:     Mental Status: She is alert and oriented to person, place, and time.     Cranial Nerves: No cranial nerve deficit.  Psychiatric:        Behavior: Behavior normal.        Thought Content: Thought content normal.        Judgment: Judgment normal.       Assessment/Plan: 1.  Generalized anxiety disorder Stable, continue trazodone at night.  2. Insomnia, unspecified type Continue trazodone at night - traZODone (DESYREL) 50 MG tablet; Take 0.5-1 tablet (25mg -50mg ) by mouth at bedtime  Dispense: 30 tablet; Refill: 2  3. Essential hypertension Stable, continue current medication  4. Acquired hypothyroidism Continue synthroid, will update labs - TSH + free T4  5. Arthritis May take ibuprofen as needed-understands that this should not be taken daily - ibuprofen (ADVIL) 800 MG tablet; Take 1 tablet (800 mg total) by mouth every 8 (eight) hours as needed.  Dispense: 30 tablet; Refill: 0  6. Visit for screening mammogram - MM 3D SCREEN BREAST BILATERAL; Future  7. Vitamin D deficiency - VITAMIN D 25 Hydroxy (Vit-D Deficiency, Fractures)  8. Mixed hyperlipidemia - Lipid Panel With LDL/HDL Ratio  9. Other fatigue - Comprehensive metabolic panel - CBC w/Diff/Platelet  10. Constipation Continue metamucil/fiber supplement and may try sample of linzess--will call for script  General Counseling: jaxson keener understanding of the findings of todays visit and agrees with plan of treatment. I have discussed any further diagnostic evaluation that may be needed or ordered today. We also reviewed her medications today. she has been encouraged to call the office with any questions or concerns that should arise related to todays visit.    Orders Placed This Encounter  Procedures   MM  3D SCREEN BREAST BILATERAL   Comprehensive metabolic panel   CBC w/Diff/Platelet   TSH + free T4   VITAMIN D 25 Hydroxy (Vit-D Deficiency, Fractures)   Lipid Panel With LDL/HDL Ratio     Meds ordered this encounter  Medications   traZODone (DESYREL) 50 MG tablet    Sig: Take 0.5-1 tablet (25mg -50mg ) by mouth at bedtime    Dispense:  30 tablet    Refill:  2   ibuprofen (ADVIL) 800 MG tablet    Sig: Take 1 tablet (800 mg total) by mouth every 8 (eight) hours as needed.    Dispense:  30 tablet    Refill:  0    This patient was seen by , PA-C in collaboration with Dr. Alvia Grove as a part of collaborative care agreement.   Total time spent:30 Minutes Time spent includes review of chart, medications, test results, and follow up plan with the patient.      Dr Internal medicine

## 2021-01-14 ENCOUNTER — Telehealth: Payer: Self-pay

## 2021-01-14 ENCOUNTER — Other Ambulatory Visit: Payer: Self-pay | Admitting: Physician Assistant

## 2021-01-14 DIAGNOSIS — K5900 Constipation, unspecified: Secondary | ICD-10-CM

## 2021-01-14 MED ORDER — LINACLOTIDE 290 MCG PO CAPS: 290.0000 ug | ORAL_CAPSULE | Freq: Every day | ORAL | 2 refills | Status: DC

## 2021-01-14 NOTE — Telephone Encounter (Signed)
Lmom that we send med to phar  ?

## 2021-01-18 DIAGNOSIS — E559 Vitamin D deficiency, unspecified: Secondary | ICD-10-CM | POA: Diagnosis not present

## 2021-01-18 DIAGNOSIS — E782 Mixed hyperlipidemia: Secondary | ICD-10-CM | POA: Diagnosis not present

## 2021-01-18 DIAGNOSIS — R5383 Other fatigue: Secondary | ICD-10-CM | POA: Diagnosis not present

## 2021-01-19 LAB — COMPREHENSIVE METABOLIC PANEL
ALT: 19 IU/L (ref 0–32)
AST: 22 IU/L (ref 0–40)
Albumin/Globulin Ratio: 1.5 (ref 1.2–2.2)
Albumin: 4.4 g/dL (ref 3.7–4.7)
Alkaline Phosphatase: 111 IU/L (ref 44–121)
BUN/Creatinine Ratio: 19 (ref 12–28)
BUN: 14 mg/dL (ref 8–27)
Bilirubin Total: 0.5 mg/dL (ref 0.0–1.2)
CO2: 26 mmol/L (ref 20–29)
Calcium: 9.6 mg/dL (ref 8.7–10.3)
Chloride: 102 mmol/L (ref 96–106)
Creatinine, Ser: 0.72 mg/dL (ref 0.57–1.00)
Globulin, Total: 3 g/dL (ref 1.5–4.5)
Glucose: 101 mg/dL — ABNORMAL HIGH (ref 65–99)
Potassium: 3.4 mmol/L — ABNORMAL LOW (ref 3.5–5.2)
Sodium: 143 mmol/L (ref 134–144)
Total Protein: 7.4 g/dL (ref 6.0–8.5)
eGFR: 89 mL/min/{1.73_m2} (ref >59)

## 2021-01-19 LAB — CBC WITH DIFFERENTIAL/PLATELET
Basophils Absolute: 0 10*3/uL (ref 0.0–0.2)
Basos: 1 %
EOS (ABSOLUTE): 0.2 10*3/uL (ref 0.0–0.4)
Eos: 2 %
Hematocrit: 45.7 % (ref 34.0–46.6)
Hemoglobin: 15.1 g/dL (ref 11.1–15.9)
Immature Grans (Abs): 0 10*3/uL (ref 0.0–0.1)
Immature Granulocytes: 0 %
Lymphocytes Absolute: 2.4 10*3/uL (ref 0.7–3.1)
Lymphs: 35 %
MCH: 28.5 pg (ref 26.6–33.0)
MCHC: 33 g/dL (ref 31.5–35.7)
MCV: 86 fL (ref 79–97)
Monocytes Absolute: 0.4 10*3/uL (ref 0.1–0.9)
Monocytes: 6 %
Neutrophils Absolute: 3.7 10*3/uL (ref 1.4–7.0)
Neutrophils: 56 %
Platelets: 223 10*3/uL (ref 150–450)
RBC: 5.3 x10E6/uL — ABNORMAL HIGH (ref 3.77–5.28)
RDW: 14.8 % (ref 11.7–15.4)
WBC: 6.7 10*3/uL (ref 3.4–10.8)

## 2021-01-19 LAB — LIPID PANEL WITH LDL/HDL RATIO
Cholesterol, Total: 200 mg/dL — ABNORMAL HIGH (ref 100–199)
HDL: 43 mg/dL (ref >39)
LDL Chol Calc (NIH): 121 mg/dL — ABNORMAL HIGH (ref 0–99)
LDL/HDL Ratio: 2.8 ratio (ref 0.0–3.2)
Triglycerides: 205 mg/dL — ABNORMAL HIGH (ref 0–149)
VLDL Cholesterol Cal: 36 mg/dL (ref 5–40)

## 2021-01-19 LAB — VITAMIN D 25 HYDROXY (VIT D DEFICIENCY, FRACTURES): Vit D, 25-Hydroxy: 29.2 ng/mL — ABNORMAL LOW (ref 30.0–100.0)

## 2021-01-19 LAB — TSH+FREE T4
Free T4: 1.28 ng/dL (ref 0.82–1.77)
TSH: 1.84 u[IU]/mL (ref 0.450–4.500)

## 2021-01-20 ENCOUNTER — Encounter: Payer: Self-pay | Admitting: Nurse Practitioner

## 2021-01-20 ENCOUNTER — Ambulatory Visit (INDEPENDENT_AMBULATORY_CARE_PROVIDER_SITE_OTHER): Payer: PPO | Admitting: Nurse Practitioner

## 2021-01-20 ENCOUNTER — Other Ambulatory Visit: Payer: Self-pay

## 2021-01-20 VITALS — BP 145/85 | HR 75 | Temp 98.1°F | Resp 16 | Ht 62.0 in | Wt 140.4 lb

## 2021-01-20 DIAGNOSIS — R7301 Impaired fasting glucose: Secondary | ICD-10-CM | POA: Diagnosis not present

## 2021-01-20 DIAGNOSIS — D751 Secondary polycythemia: Secondary | ICD-10-CM | POA: Diagnosis not present

## 2021-01-20 DIAGNOSIS — E876 Hypokalemia: Secondary | ICD-10-CM

## 2021-01-20 DIAGNOSIS — G4709 Other insomnia: Secondary | ICD-10-CM | POA: Diagnosis not present

## 2021-01-20 DIAGNOSIS — R5383 Other fatigue: Secondary | ICD-10-CM | POA: Diagnosis not present

## 2021-01-20 DIAGNOSIS — M1990 Unspecified osteoarthritis, unspecified site: Secondary | ICD-10-CM

## 2021-01-20 MED ORDER — GABAPENTIN 100 MG PO CAPS: 100.0000 mg | ORAL_CAPSULE | Freq: Three times a day (TID) | ORAL | 2 refills | Status: DC

## 2021-01-20 NOTE — Progress Notes (Signed)
[facility] [address] [place], [place] [address]  Internal MEDICINE  Office Visit Note  Patient Name: Michelle [name]  [id number]  [id number]  Date of Service: 01/20/2021  Chief Complaint  Patient presents with   Acute Visit    Started about 2 weeks ago, feels off   Fatigue     HPI Michelle [name] presents for an acute sick visit for feeling fatigued, sleepy, c/o of aches and pains. She has a history of arthritis and insomnia. She also has been experiencing fatigue for the past 2 weeks and reports feeling "off". She has had some labs done recently. Her TSH and free T4 was normal. Her vitamin D level is slightly low. Her potassium level is slightly low. She has consistent elevated fasting glucose levels. Her lipid panel is significantly elevated. Her CBC is wnl except for mild erythrocytosis.  She reports that trazodone is not working for her and causes a bad headache. She has tried mirtazapine and states that it made her too sleepy the next day.  Will discuss statin therapy at follow up visit.     Current Medication:  Outpatient Encounter Medications as of 01/20/2021  Medication Sig   amLODipine (NORVASC) 5 MG tablet Take 1 tablet (5 mg total) by mouth daily.   aspirin EC 81 MG tablet Take 81 mg by mouth daily.   conjugated estrogens (PREMARIN) vaginal cream 1 gram vaginally nightly at bedtime for 2 weeks, 1 gram vaginally every other night at bedtime for 2 weeks, then 1 gram vaginally twice weekly   estradiol (ESTRACE) 0.5 MG tablet TAKE 1 TABLET BY MOUTH ONCE DAILY   fluticasone (FLONASE) 50 MCG/ACT nasal spray Place 1 spray into both nostrils daily as needed.   gabapentin (NEURONTIN) 100 MG capsule Take 1 capsule (100 mg total) by mouth 3 (three) times daily. May increase dose to 2 capsule at bedtime if 1 capsule is not effective.   ibuprofen (ADVIL) 800 MG tablet Take 1 tablet (800 mg total) by mouth every 8 (eight) hours as needed.   levothyroxine (SYNTHROID) 88 MCG  tablet Take 1 tablet (88 mcg total) by mouth daily before breakfast.   linaclotide (LINZESS) 290 MCG CAPS capsule Take 1 capsule (290 mcg total) by mouth daily before breakfast.   nitrofurantoin (MACRODANTIN) 50 MG capsule Take one capsule by mouth daily for 3 months   [DISCONTINUED] traZODone (DESYREL) 50 MG tablet Take 0.5-1 tablet (25mg -50mg ) by mouth at bedtime   No facility-administered encounter medications on file as of 01/20/2021.      Medical History: Past Medical History:  Diagnosis Date   Bladder incontinence    Hypertension    Hypothyroidism      Vital Signs: BP (!) 145/85 Comment: 154/88  Pulse 75   Temp 98.1 F (36.7 C)   Resp 16   Ht 5\' 2"  (1.575 m)   Wt 140 lb 6.4 oz (63.7 kg)   SpO2 98%   BMI 25.68 kg/m    Review of Systems  Constitutional:  Positive for fatigue. Negative for chills, fever and unexpected weight change.  HENT:  Negative for congestion, rhinorrhea, sneezing and sore throat.   Eyes:  Negative for redness.  Respiratory:  Negative for cough, chest tightness and shortness of breath.   Cardiovascular:  Negative for chest pain and palpitations.  Gastrointestinal:  Negative for abdominal pain, constipation, diarrhea, nausea and vomiting.  Genitourinary:  Negative for dysuria and frequency.  Musculoskeletal:  Negative for arthralgias, back pain, joint swelling and neck pain.  Skin:  Negative for rash.  Neurological: Negative.  Negative for tremors and numbness.  Hematological:  Negative for adenopathy. Does not bruise/bleed easily.  Psychiatric/Behavioral:  Positive for sleep disturbance. Negative for behavioral problems (Depression), self-injury and suicidal ideas. The patient is not nervous/anxious.    Physical Exam Vitals reviewed.  Constitutional:      General: She is not in acute distress.    Appearance: Normal appearance. She is normal weight. She is not ill-appearing.  HENT:     Head: Normocephalic and atraumatic.  Eyes:      Extraocular Movements: Extraocular movements intact.     Pupils: Pupils are equal, round, and reactive to light.  Cardiovascular:     Rate and Rhythm: Normal rate and regular rhythm.  Pulmonary:     Effort: Pulmonary effort is normal. No respiratory distress.  Neurological:     Mental Status: She is alert and oriented to person, place, and time.     Cranial Nerves: No cranial nerve deficit.     Coordination: Coordination normal.     Gait: Gait normal.  Psychiatric:        Mood and Affect: Mood normal.        Behavior: Behavior normal.     Assessment/Plan: 1. Other fatigue Rule out vitamin deficiencies and iron deficiency - B12 and Folate Panel - Iron, TIBC and Ferritin Panel  2. Other insomnia Start gabapentin, instructed to take 100-200 mg at bedtime for insomnia. This may also be beneficial for arthritic pains at night.  - gabapentin (NEURONTIN) 100 MG capsule; Take 1 capsule (100 mg total) by mouth daily at bedtime. May increase dose to 2 capsule at bedtime if 1 capsule is not effective.  Dispense: 30 capsule; Refill: 2  3. Erythrocytosis Noted on her most recent CBC, labs ordered to check vitamin levels and iron studies.  - B12 and Folate Panel - Iron, TIBC and Ferritin Panel  4. Hypokalemia Slightly low potassium level, repeat lab ordered.  - Potassium  5. Impaired fasting glucose Consistently elevated fasting glucose levels, consider checking A1C at next office visit.    General Counseling: shelsy seng understanding of the findings of todays visit and agrees with plan of treatment. I have discussed any further diagnostic evaluation that may be needed or ordered today. We also reviewed her medications today. she has been encouraged to call the office with any questions or concerns that should arise related to todays visit.    Counseling:    Orders Placed This Encounter  Procedures   B12 and Folate Panel   Iron, TIBC and Ferritin Panel    Meds ordered  this encounter  Medications   gabapentin (NEURONTIN) 100 MG capsule    Sig: Take 1 capsule (100 mg total) by mouth 3 (three) times daily. May increase dose to 2 capsule at bedtime if 1 capsule is not effective.    Dispense:  30 capsule    Refill:  2    Return in 6 weeks (on 02/28/2021) for previously scheduled, F/U with Lauren.  [place] Controlled Substance Database was reviewed by me for overdose risk score (ORS)  Time spent:30 Minutes Time spent with patient included reviewing progress notes, labs, imaging studies, and discussing plan for follow up.   This patient was seen by Sallyanne Kuster, FNP-C in collaboration with Dr. Beverely Risen as a part of collaborative care agreement.  Jeffrey Voth R. Tedd Sias, MSN, FNP-C Internal Medicine

## 2021-01-30 MED ORDER — GABAPENTIN 100 MG PO CAPS: 100.0000 mg | ORAL_CAPSULE | Freq: Every day | ORAL | 2 refills | Status: DC

## 2021-02-02 ENCOUNTER — Encounter: Payer: Self-pay | Admitting: Nurse Practitioner

## 2021-02-16 DIAGNOSIS — M25511 Pain in right shoulder: Secondary | ICD-10-CM | POA: Diagnosis not present

## 2021-02-16 DIAGNOSIS — M7581 Other shoulder lesions, right shoulder: Secondary | ICD-10-CM | POA: Diagnosis not present

## 2021-02-16 DIAGNOSIS — S4991XA Unspecified injury of right shoulder and upper arm, initial encounter: Secondary | ICD-10-CM | POA: Diagnosis not present

## 2021-02-16 DIAGNOSIS — G8929 Other chronic pain: Secondary | ICD-10-CM | POA: Diagnosis not present

## 2021-02-16 DIAGNOSIS — M19011 Primary osteoarthritis, right shoulder: Secondary | ICD-10-CM | POA: Diagnosis not present

## 2021-02-16 DIAGNOSIS — I700 Atherosclerosis of aorta: Secondary | ICD-10-CM | POA: Diagnosis not present

## 2021-02-16 DIAGNOSIS — M25711 Osteophyte, right shoulder: Secondary | ICD-10-CM | POA: Diagnosis not present

## 2021-02-17 ENCOUNTER — Ambulatory Visit: Payer: Self-pay | Admitting: Urology

## 2021-02-18 ENCOUNTER — Emergency Department
Admission: EM | Admit: 2021-02-18 | Discharge: 2021-02-18 | Disposition: A | Discharge status: Home / Self Care | Payer: PPO | Attending: Emergency Medicine | Admitting: Emergency Medicine

## 2021-02-18 ENCOUNTER — Other Ambulatory Visit: Payer: Self-pay

## 2021-02-18 DIAGNOSIS — Z87891 Personal history of nicotine dependence: Secondary | ICD-10-CM | POA: Insufficient documentation

## 2021-02-18 DIAGNOSIS — I10 Essential (primary) hypertension: Secondary | ICD-10-CM | POA: Insufficient documentation

## 2021-02-18 DIAGNOSIS — T380X5A Adverse effect of glucocorticoids and synthetic analogues, initial encounter: Secondary | ICD-10-CM | POA: Insufficient documentation

## 2021-02-18 DIAGNOSIS — Z79899 Other long term (current) drug therapy: Secondary | ICD-10-CM | POA: Insufficient documentation

## 2021-02-18 DIAGNOSIS — M25511 Pain in right shoulder: Secondary | ICD-10-CM | POA: Insufficient documentation

## 2021-02-18 DIAGNOSIS — T50905A Adverse effect of unspecified drugs, medicaments and biological substances, initial encounter: Secondary | ICD-10-CM

## 2021-02-18 DIAGNOSIS — T7840XA Allergy, unspecified, initial encounter: Secondary | ICD-10-CM | POA: Diagnosis not present

## 2021-02-18 DIAGNOSIS — E039 Hypothyroidism, unspecified: Secondary | ICD-10-CM | POA: Diagnosis not present

## 2021-02-18 MED ORDER — DICLOFENAC SODIUM 1 % EX GEL: 2.0000 g | Freq: Four times a day (QID) | CUTANEOUS | 0 refills | Status: DC

## 2021-02-18 MED ORDER — LIDOCAINE 5 % EX PTCH
1.0000 | MEDICATED_PATCH | CUTANEOUS | Status: DC
  Administered 2021-02-18: 1 via TRANSDERMAL
  Filled 2021-02-18: qty 1

## 2021-02-18 NOTE — ED Triage Notes (Signed)
Pt states was Rx prednisone Wednesday for shoulder pain , states since last night having redness to the face and feeling flushed. No itching or respiratory issues

## 2021-02-18 NOTE — ED Notes (Signed)
See triage note  presents with possible reaction to PO prednisone  states she started the meds on weds for right shoulder pain  states shoulder pain is better  but states she noticed that her skin was blotchy and she became "hot  and then developed chills"

## 2021-02-18 NOTE — ED Provider Notes (Signed)
[facility] Emergency Department Provider Note   ____________________________________________   Event Date/Time   First MD Initiated Contact with Patient 02/18/21 1054     (approximate)  I have reviewed the triage vital signs and the nursing notes.   HISTORY  Chief Complaint Medication Reaction    HPI Michelle [name] is a 70 y.o. female patient suspect medication reaction secondary to taking prednisone.  Patient was diagnosed with rotator cuff tendinitis and prescribed prednisone twice a day pending evaluation by orthopedics.  Patient started medication 2 days ago.  Patient states she noticed improvement with her right shoulder pain.  Patient states she woke up earlier this morning with chills and fever.  Patient states when she got out of bed she noticed her face felt flushed.  Patient states she went to the bathroom her face was red.  Patient denies dyspnea or any other anaphylactic signs or symptoms.  Patient states she has taken prednisone in the past.  Denies any new medications or food in the past 48 hours.  No acute distress at this time.      Past Medical History:  Diagnosis Date   Bladder incontinence    Hypertension    Hypothyroidism     Patient Active Problem List   Diagnosis Date Noted   Insomnia 06/12/2020   Aortic atherosclerosis (HCC) 03/17/2020   Bladder prolapse, female, acquired 03/17/2020   Encounter for screening mammogram for malignant neoplasm of breast 03/17/2020   Acute non-recurrent pansinusitis 11/23/2019   Pyelonephritis 10/25/2019   Acute pyelonephritis 10/24/2019   Hypokalemia 10/24/2019   Chronic right-sided low back pain with right-sided sciatica 10/08/2019   Encounter for long-term (current) use of medications 10/08/2019   Renal calculi 04/20/2019   Vasomotor rhinitis 04/20/2019   Hematuria 04/05/2019   Encounter for general adult medical examination with abnormal findings 03/05/2019   Urinary tract infection  with hematuria 03/05/2019   Need for vaccination against Streptococcus pneumoniae using pneumococcal conjugate vaccine 7 03/05/2019   Dysuria 03/05/2019   Other fatigue 11/25/2018   Acute bronchitis 06/05/2018   Cough 06/05/2018   Unspecified menopausal and perimenopausal disorder 12/28/2017   Generalized anxiety disorder 12/28/2017   Essential hypertension 08/20/2017   Hypothyroidism 08/20/2017   Encounter for long-term (current) use of high-risk medication 08/20/2017    Past Surgical History:  Procedure Laterality Date   child birth natural     diatation      Prior to Admission medications   Medication Sig Start Date End Date Taking? Authorizing Provider  diclofenac Sodium (VOLTAREN) 1 % GEL Apply 2 g topically 4 (four) times daily. 02/18/21  Yes Joni Reining, PA-C  amLODipine (NORVASC) 5 MG tablet Take 1 tablet (5 mg total) by mouth daily. 05/28/20   Carlean Jews, NP  aspirin EC 81 MG tablet Take 81 mg by mouth daily.    [provider]  conjugated estrogens (PREMARIN) vaginal cream 1 gram vaginally nightly at bedtime for 2 weeks, 1 gram vaginally every other night at bedtime for 2 weeks, then 1 gram vaginally twice weekly 05/13/20   Schuman, Christanna R, MD  estradiol (ESTRACE) 0.5 MG tablet TAKE 1 TABLET BY MOUTH ONCE DAILY 05/28/20   Carlean Jews, NP  fluticasone (FLONASE) 50 MCG/ACT nasal spray Place 1 spray into both nostrils daily as needed. 04/08/19   Carlean Jews, NP  gabapentin (NEURONTIN) 100 MG capsule Take 1 capsule (100 mg total) by mouth at bedtime. May increase dose to 2 capsule at  bedtime if 1 capsule is not effective. 01/30/21   Sallyanne Kuster, NP  ibuprofen (ADVIL) 800 MG tablet Take 1 tablet (800 mg total) by mouth every 8 (eight) hours as needed. 12/20/20   McDonough, Salomon Fick, PA-C  levothyroxine (SYNTHROID) 88 MCG tablet Take 1 tablet (88 mcg total) by mouth daily before breakfast. 06/23/20   [name], MD  linaclotide [facility]) 290 MCG  CAPS capsule Take 1 capsule (290 mcg total) by mouth daily before breakfast. 01/14/21   McDonough, Salomon Fick, PA-C  nitrofurantoin (MACRODANTIN) 50 MG capsule Take one capsule by mouth daily for 3 months 11/10/20   Sondra Come, MD    Allergies Sulfa antibiotics  Family History  Problem Relation Age of Onset   Cancer Father    Hyperlipidemia Father    Hypertension Father     Social History Social History   Tobacco Use   Smoking status: Former    Types: Cigarettes   Smokeless tobacco: Never  Substance Use Topics   Alcohol use: No   Drug use: No    Review of Systems Constitutional: No fever/chills Eyes: No visual changes. ENT: No sore throat. Cardiovascular: Denies chest pain. Respiratory: Denies shortness of breath. Gastrointestinal: No abdominal pain.  No nausea, no vomiting.  No diarrhea.  No constipation. Genitourinary: Negative for dysuria. Musculoskeletal: Right shoulder pain  skin: Negative for rash. Neurological: Negative for headaches, focal weakness or numbness. Endocrine: Hypertension and hypothyroidism. Allergic/Immunilogical: Sulfa antibiotics.  ____________________________________________   PHYSICAL EXAM:  VITAL SIGNS: ED Triage Vitals [02/18/21 1005]  Enc Vitals Group     BP (!) 145/81     Pulse Rate 71     Resp 20     Temp 98.6 F (37 C)     Temp Source Oral     SpO2 97 %     Weight 140 lb (63.5 kg)     Height 5\' 2"  (1.575 m)     Head Circumference      Peak Flow      Pain Score      Pain Loc      Pain Edu?      Excl. in GC?    Constitutional: Alert and oriented. Well appearing and in no acute distress. Cardiovascular: Normal rate, regular rhythm. Grossly normal heart sounds.  Good peripheral circulation. Respiratory: Normal respiratory effort.  No retractions. Lungs CTAB. Gastrointestinal: Soft and nontender. No distention. No abdominal bruits. No CVA tenderness. Genitourinary: Deferred Musculoskeletal: Patient right-hand-dominant.   No obvious deformity to the right upper extremity.  Patient decreased range of motion with adduction overhead reaching.   Neurologic:  Normal speech and language. No gross focal neurologic deficits are appreciated. No gait instability. Skin:  Skin is warm, dry and intact. No rash noted. Psychiatric: Mood and affect are normal. Speech and behavior are normal.  ____________________________________________   LABS (all labs ordered are listed, but only abnormal results are displayed)  Labs Reviewed - No data to display ____________________________________________  EKG   ____________________________________________  RADIOLOGY I, , personally viewed and evaluated these images (plain radiographs) as part of my medical decision making, as well as reviewing the written report by the radiologist.  ED MD interpretation:    Official radiology report(s): No results found.  ____________________________________________   PROCEDURES  Procedure(s) performed (including Critical Care):  Procedures   ____________________________________________   INITIAL IMPRESSION / ASSESSMENT AND PLAN / ED COURSE  As part of my medical decision making, I reviewed the following data within  the electronic MEDICAL RECORD NUMBER         Medication reaction.  Patient advised to discontinue prednisone.  Patient had a Lidoderm patch applied to right shoulder prior to departure.  Patient given prescription for Voltaren cream and advised to follow-up with scheduled orthopedic appointment.      ____________________________________________   FINAL CLINICAL IMPRESSION(S) / ED DIAGNOSES  Final diagnoses:  Medication reaction, initial encounter     ED Discharge Orders          Ordered    diclofenac Sodium (VOLTAREN) 1 % GEL  4 times daily        02/18/21 1105             Note:  This document was prepared using Dragon voice recognition software and may include unintentional dictation  errors.    Joni Reining, PA-C 02/18/21 1116    Sharyn Creamer, MD 02/18/21 1556

## 2021-02-18 NOTE — Discharge Instructions (Addendum)
Advised to discontinue prednisone pending evaluation by orthopedics.  Wear the Lidoderm patch for 12 hours.  After 12 hours start using Voltaren gel.

## 2021-02-24 ENCOUNTER — Encounter: Payer: Self-pay | Admitting: Internal Medicine

## 2021-02-24 ENCOUNTER — Other Ambulatory Visit: Payer: Self-pay

## 2021-02-24 ENCOUNTER — Ambulatory Visit (INDEPENDENT_AMBULATORY_CARE_PROVIDER_SITE_OTHER): Payer: PPO | Admitting: Internal Medicine

## 2021-02-24 ENCOUNTER — Telehealth: Payer: Self-pay

## 2021-02-24 DIAGNOSIS — G4709 Other insomnia: Secondary | ICD-10-CM | POA: Diagnosis not present

## 2021-02-24 DIAGNOSIS — N951 Menopausal and female climacteric states: Secondary | ICD-10-CM

## 2021-02-24 DIAGNOSIS — Z1231 Encounter for screening mammogram for malignant neoplasm of breast: Secondary | ICD-10-CM

## 2021-02-24 DIAGNOSIS — G8929 Other chronic pain: Secondary | ICD-10-CM

## 2021-02-24 DIAGNOSIS — M25511 Pain in right shoulder: Secondary | ICD-10-CM | POA: Diagnosis not present

## 2021-02-24 MED ORDER — ALPRAZOLAM 0.25 MG PO TABS: ORAL_TABLET | ORAL | 1 refills | Status: DC

## 2021-02-24 MED ORDER — ESTRADIOL 0.5 MG PO TABS: 0.5000 mg | ORAL_TABLET | Freq: Every day | ORAL | 1 refills | Status: DC

## 2021-02-24 NOTE — Telephone Encounter (Signed)
Per dfk, I called [facility] regarding patient being able to schedule mammogram ordered by Leotis Shames 12/20/20. Patient indicated when she calls to schedule appointment they tell her they cannot schedule without order. I spoke with Efraim Kaufmann, she stated if patient mentioned she did not want ordered by [facility] or that she is having breast concerns, they would not be able to schedule her. I spoke with dfk, she stated patient is not having any issues. I then spoke with patient. She stated she was fine with [facility] ordering mammogram and does not have any breast concerns. She will call back to [facility] to schedule-Toni

## 2021-02-24 NOTE — Progress Notes (Signed)
[facility] [address] [place], [place] [address]  Internal MEDICINE  Office Visit Note  Patient Name: Michelle [name]  [id number]  [id number]  Date of Service: 02/24/2021  Chief Complaint  Patient presents with   Follow-up    Shoulder pain right    HPI Patient is here with complaints of right-sided shoulder pain, she went to urgent care and was p.o. prednisone however she was unable to take it thought that developed allergic reaction, started having some flushing on her face even though she has taken prednisone before.  Patient has an appointment to see orthopedic in few days  She is having problems sleeping at night, trazodone and Remeron did not work for her she would like to go back on her alprazolam She is also due for her mammogram Current Medication: Outpatient Encounter Medications as of 02/24/2021  Medication Sig   ALPRAZolam (XANAX) 0.25 MG tablet Take one tab po qhs for insomnia   amLODipine (NORVASC) 5 MG tablet Take 1 tablet (5 mg total) by mouth daily.   aspirin EC 81 MG tablet Take 81 mg by mouth daily.   diclofenac Sodium (VOLTAREN) 1 % GEL Apply 2 g topically 4 (four) times daily.   levothyroxine (SYNTHROID) 88 MCG tablet Take 1 tablet (88 mcg total) by mouth daily before breakfast.   linaclotide (LINZESS) 290 MCG CAPS capsule Take 1 capsule (290 mcg total) by mouth daily before breakfast.   [DISCONTINUED] estradiol (ESTRACE) 0.5 MG tablet TAKE 1 TABLET BY MOUTH ONCE DAILY   estradiol (ESTRACE) 0.5 MG tablet Take 1 tablet (0.5 mg total) by mouth daily.   [DISCONTINUED] conjugated estrogens (PREMARIN) vaginal cream 1 gram vaginally nightly at bedtime for 2 weeks, 1 gram vaginally every other night at bedtime for 2 weeks, then 1 gram vaginally twice weekly (Patient not taking: Reported on 02/24/2021)   [DISCONTINUED] fluticasone (FLONASE) 50 MCG/ACT nasal spray Place 1 spray into both nostrils daily as needed. (Patient not taking: Reported on 02/24/2021)    [DISCONTINUED] gabapentin (NEURONTIN) 100 MG capsule Take 1 capsule (100 mg total) by mouth at bedtime. May increase dose to 2 capsule at bedtime if 1 capsule is not effective. (Patient not taking: Reported on 02/24/2021)   [DISCONTINUED] ibuprofen (ADVIL) 800 MG tablet Take 1 tablet (800 mg total) by mouth every 8 (eight) hours as needed. (Patient not taking: Reported on 02/24/2021)   [DISCONTINUED] nitrofurantoin (MACRODANTIN) 50 MG capsule Take one capsule by mouth daily for 3 months (Patient not taking: Reported on 02/24/2021)   No facility-administered encounter medications on file as of 02/24/2021.    Surgical History: Past Surgical History:  Procedure Laterality Date   child birth natural     diatation      Medical History: Past Medical History:  Diagnosis Date   Bladder incontinence    Hypertension    Hypothyroidism     Family History: Family History  Problem Relation Age of Onset   Cancer Father    Hyperlipidemia Father    Hypertension Father     Social History   Socioeconomic History   Marital status: Married    Spouse name: Not on file   Number of children: Not on file   Years of education: Not on file   Highest education level: Not on file  Occupational History   Not on file  Tobacco Use   Smoking status: Former    Types: Cigarettes   Smokeless tobacco: Never  Substance and Sexual Activity   Alcohol use: No  Drug use: No   Sexual activity: Not Currently    Birth control/protection: Surgical    Comment: Hysterectomy  Other Topics Concern   Not on file  Social History Narrative   Not on file   Social Determinants of Health   Financial Resource Strain: Not on file  Food Insecurity: Not on file  Transportation Needs: Not on file  Physical Activity: Not on file  Stress: Not on file  Social Connections: Not on file  Intimate Partner Violence: Not on file      Review of Systems  Constitutional:  Negative for chills, fatigue and unexpected weight  change.  HENT:  Negative for congestion, postnasal drip, rhinorrhea, sneezing and sore throat.   Eyes:  Negative for redness.  Respiratory:  Negative for cough, chest tightness and shortness of breath.   Cardiovascular:  Negative for chest pain and palpitations.  Gastrointestinal:  Negative for abdominal pain, constipation, diarrhea, nausea and vomiting.  Genitourinary:  Negative for dysuria and frequency.  Musculoskeletal:  Positive for joint swelling. Negative for arthralgias, back pain and neck pain.  Skin:  Negative for rash.  Neurological: Negative.  Negative for tremors and numbness.  Hematological:  Negative for adenopathy. Does not bruise/bleed easily.  Psychiatric/Behavioral:  Positive for sleep disturbance. Negative for behavioral problems (Depression) and suicidal ideas. The patient is nervous/anxious.    Vital Signs: BP 126/78   Pulse 84   Temp 98.4 F (36.9 C)   Resp 16   Ht 5\' 2"  (1.575 m)   Wt 141 lb 12.8 oz (64.3 kg)   BMI 25.94 kg/m    Physical Exam Constitutional:      Appearance: Normal appearance.  HENT:     Head: Normocephalic and atraumatic.     Nose: Nose normal.     Mouth/Throat:     Mouth: Mucous membranes are moist.     Pharynx: No posterior oropharyngeal erythema.  Eyes:     Extraocular Movements: Extraocular movements intact.     Pupils: Pupils are equal, round, and reactive to light.  Cardiovascular:     Pulses: Normal pulses.     Heart sounds: Normal heart sounds.  Pulmonary:     Effort: Pulmonary effort is normal.     Breath sounds: Normal breath sounds.  Neurological:     General: No focal deficit present.     Mental Status: She is alert.  Psychiatric:        Mood and Affect: Mood normal.        Behavior: Behavior normal.     Assessment/Plan: 1. Chronic right shoulder pain Patient is instructed to try Voltaren gel over-the-counter, she can also start low-dose of prednisone for now and continue with follow-up with orthopedic  2.  Vasomotor symptoms due to menopause Patient was educated and informed about side effects and complication of long-term use of hormone replacement therapy she understands the risk of breast cancer, she will like to continue on low-dose Estrace for now - estradiol (ESTRACE) 0.5 MG tablet; Take 1 tablet (0.5 mg total) by mouth daily.  Dispense: 90 tablet; Refill: 1  3. Other insomnia She has failed trazodone, Remeron and will restart alprazolam and will continue as before - ALPRAZolam (XANAX) 0.25 MG tablet; Take one tab po qhs for insomnia  Dispense: 30 tablet; Refill: 1  4. Encounter for mammogram to establish baseline mammogram Patient is  instructed to get her mammograms on a regular basis since she is on chronic estrogen therapy - MM DIGITAL SCREENING BILATERAL; Future  General Counseling: aniza shor understanding of the findings of todays visit and agrees with plan of treatment. I have discussed any further diagnostic evaluation that may be needed or ordered today. We also reviewed her medications today. she has been encouraged to call the office with any questions or concerns that should arise related to todays visit.    Orders Placed This Encounter  Procedures   MM DIGITAL SCREENING BILATERAL    Meds ordered this encounter  Medications   estradiol (ESTRACE) 0.5 MG tablet    Sig: Take 1 tablet (0.5 mg total) by mouth daily.    Dispense:  90 tablet    Refill:  1   ALPRAZolam (XANAX) 0.25 MG tablet    Sig: Take one tab po qhs for insomnia    Dispense:  30 tablet    Refill:  1    Total time spent:30 Minutes Time spent includes review of chart, medications, test results, and follow up plan with the patient.   [place] Controlled Substance Database was reviewed by me.   Dr [name] Internal medicine

## 2021-02-28 ENCOUNTER — Ambulatory Visit: Payer: PPO | Admitting: Physician Assistant

## 2021-02-28 DIAGNOSIS — M7581 Other shoulder lesions, right shoulder: Secondary | ICD-10-CM | POA: Diagnosis not present

## 2021-02-28 DIAGNOSIS — M7531 Calcific tendinitis of right shoulder: Secondary | ICD-10-CM | POA: Diagnosis not present

## 2021-03-03 ENCOUNTER — Ambulatory Visit: Payer: PPO | Admitting: Urology

## 2021-03-09 ENCOUNTER — Encounter: Payer: Self-pay | Admitting: Urology

## 2021-03-09 ENCOUNTER — Ambulatory Visit: Payer: PPO | Admitting: Urology

## 2021-03-22 ENCOUNTER — Telehealth: Payer: Self-pay | Admitting: Internal Medicine

## 2021-03-22 NOTE — Chronic Care Management (AMB) (Signed)
  Chronic Care Management   Outreach Note  03/22/2021 Name: Michelle [name] MRN: [id number] DOB: June 21, 1950  Referred by: [name], MD Reason for referral : No chief complaint on file.   An unsuccessful telephone outreach was attempted today. The patient was referred to the pharmacist for assistance with care management and care coordination.   Follow Up Plan:   Tatjana Dellinger Upstream Scheduler

## 2021-03-23 ENCOUNTER — Other Ambulatory Visit: Payer: Self-pay | Admitting: Physician Assistant

## 2021-03-23 DIAGNOSIS — M1990 Unspecified osteoarthritis, unspecified site: Secondary | ICD-10-CM

## 2021-04-01 ENCOUNTER — Other Ambulatory Visit: Payer: Self-pay

## 2021-04-01 ENCOUNTER — Ambulatory Visit
Admission: RE | Admit: 2021-04-01 | Discharge: 2021-04-01 | Disposition: A | Discharge status: Home / Self Care | Payer: PPO | Source: Ambulatory Visit | Attending: Physician Assistant | Admitting: Physician Assistant

## 2021-04-01 DIAGNOSIS — Z1231 Encounter for screening mammogram for malignant neoplasm of breast: Secondary | ICD-10-CM | POA: Insufficient documentation

## 2021-04-05 ENCOUNTER — Encounter: Payer: Self-pay | Admitting: Nurse Practitioner

## 2021-04-05 ENCOUNTER — Telehealth (INDEPENDENT_AMBULATORY_CARE_PROVIDER_SITE_OTHER): Payer: PPO | Admitting: Nurse Practitioner

## 2021-04-05 ENCOUNTER — Other Ambulatory Visit: Payer: Self-pay

## 2021-04-05 VITALS — BP 125/78 | Temp 99.0°F | Resp 16 | Ht 62.0 in | Wt 140.0 lb

## 2021-04-05 DIAGNOSIS — R051 Acute cough: Secondary | ICD-10-CM

## 2021-04-05 DIAGNOSIS — J018 Other acute sinusitis: Secondary | ICD-10-CM | POA: Diagnosis not present

## 2021-04-05 MED ORDER — AMOXICILLIN-POT CLAVULANATE 875-125 MG PO TABS: 1.0000 | ORAL_TABLET | Freq: Two times a day (BID) | ORAL | 0 refills | Status: DC

## 2021-04-05 MED ORDER — GUAIFENESIN-CODEINE 100-10 MG/5ML PO SYRP: 5.0000 mL | ORAL_SOLUTION | Freq: Three times a day (TID) | ORAL | 0 refills | Status: DC | PRN

## 2021-04-05 NOTE — Progress Notes (Signed)
[facility] [address] [place], [place] [address]  Internal MEDICINE  Telephone Visit  Patient Name: Michelle [name]  [id number]  [id number]  Date of Service: 04/05/2021  I connected with the patient at 11:45 AM by telephone and verified the patients identity using two identifiers.   I discussed the limitations, risks, security and privacy concerns of performing an evaluation and management service by telephone and the availability of in person appointments. I also discussed with the patient that there may be a patient responsible charge related to the service.  The patient expressed understanding and agrees to proceed.    Chief Complaint  Patient presents with   Acute Visit    Will take home covid test, pt will provide some vitals when speaking with the provider    Telephone Assessment    [phone]   Telephone Screen    Phone call   Cough    Had it for 2 weeks, congestion    Sinusitis    HPI Almena presents for a telehealth virtual visit for symptoms of sinusitis. She reports cough and congestion x2 weeks. She took a home covid test which was negative. She denies any chills, body aches, fatigue, sinus pain/pressure, headaches, runny nose, SOB, wheezing or chest tightness. She reports nasal congestion, low grade fever, postnasal drip and cough.    Current Medication: Outpatient Encounter Medications as of 04/05/2021  Medication Sig   ALPRAZolam (XANAX) 0.25 MG tablet Take one tab po qhs for insomnia   amLODipine (NORVASC) 5 MG tablet Take 1 tablet (5 mg total) by mouth daily.   amoxicillin-clavulanate (AUGMENTIN) 875-125 MG tablet Take 1 tablet by mouth 2 (two) times daily.   aspirin EC 81 MG tablet Take 81 mg by mouth daily.   diclofenac Sodium (VOLTAREN) 1 % GEL Apply 2 g topically 4 (four) times daily.   estradiol (ESTRACE) 0.5 MG tablet Take 1 tablet (0.5 mg total) by mouth daily.   guaiFENesin-codeine (ROBITUSSIN AC) 100-10 MG/5ML syrup Take 5 mLs by  mouth 3 (three) times daily as needed for cough or congestion.   levothyroxine (SYNTHROID) 88 MCG tablet Take 1 tablet (88 mcg total) by mouth daily before breakfast.   linaclotide (LINZESS) 290 MCG CAPS capsule Take 1 capsule (290 mcg total) by mouth daily before breakfast.   No facility-administered encounter medications on file as of 04/05/2021.    Surgical History: Past Surgical History:  Procedure Laterality Date   child birth natural     diatation      Medical History: Past Medical History:  Diagnosis Date   Bladder incontinence    Hypertension    Hypothyroidism     Family History: Family History  Problem Relation Age of Onset   Cancer Father    Hyperlipidemia Father    Hypertension Father    Breast cancer Neg Hx     Social History   Socioeconomic History   Marital status: Married    Spouse name: Not on file   Number of children: Not on file   Years of education: Not on file   Highest education level: Not on file  Occupational History   Not on file  Tobacco Use   Smoking status: Former    Types: Cigarettes   Smokeless tobacco: Never  Substance and Sexual Activity   Alcohol use: No   Drug use: No   Sexual activity: Not Currently    Birth control/protection: Surgical    Comment: Hysterectomy  Other Topics Concern   Not on  file  Social History Narrative   Not on file   Social Determinants of Health   Financial Resource Strain: Not on file  Food Insecurity: Not on file  Transportation Needs: Not on file  Physical Activity: Not on file  Stress: Not on file  Social Connections: Not on file  Intimate Partner Violence: Not on file      Review of Systems  Constitutional:  Positive for fever. Negative for chills, diaphoresis and fatigue.  HENT:  Positive for congestion and postnasal drip. Negative for ear pain, rhinorrhea, sinus pressure, sinus pain, sneezing, sore throat and trouble swallowing.   Eyes:  Negative for pain.  Respiratory:  Positive  for cough. Negative for chest tightness, shortness of breath and wheezing.   Cardiovascular: Negative.  Negative for chest pain and palpitations.  Gastrointestinal: Negative.  Negative for abdominal pain, constipation, diarrhea, nausea and vomiting.  Musculoskeletal: Negative.  Negative for myalgias.  Skin: Negative.  Negative for rash.  Neurological:  Negative for dizziness, light-headedness and headaches.   Vital Signs: BP 125/78   Temp 99 F (37.2 C)   Resp 16   Ht 5\' 2"  (1.575 m)   Wt 140 lb (63.5 kg)   BMI 25.61 kg/m    Observation/Objective: Blake is alert and oriented and engages in conversation appropriately. She does not sound as though she is in any acute distress over telephonecall.     Assessment/Plan: 1. Acute non-recurrent sinusitis of other sinus Empiric antibiotic treatment for developing sinus infection - amoxicillin-clavulanate (AUGMENTIN) 875-125 MG tablet; Take 1 tablet by mouth 2 (two) times daily.  Dispense: 20 tablet; Refill: 0  2. Acute cough Symptomatic treatment prescribed for cough.  - guaiFENesin-codeine (ROBITUSSIN AC) 100-10 MG/5ML syrup; Take 5 mLs by mouth 3 (three) times daily as needed for cough or congestion.  Dispense: 236 mL; Refill: 0   General Counseling: Miangel verbalizes understanding of the findings of today's phone visit and agrees with plan of treatment. I have discussed any further diagnostic evaluation that may be needed or ordered today. We also reviewed her medications today. she has been encouraged to call the office with any questions or concerns that should arise related to todays visit.  Return if symptoms worsen or fail to improve.   No orders of the defined types were placed in this encounter.   Meds ordered this encounter  Medications   amoxicillin-clavulanate (AUGMENTIN) 875-125 MG tablet    Sig: Take 1 tablet by mouth 2 (two) times daily.    Dispense:  20 tablet    Refill:  0   guaiFENesin-codeine (ROBITUSSIN  AC) 100-10 MG/5ML syrup    Sig: Take 5 mLs by mouth 3 (three) times daily as needed for cough or congestion.    Dispense:  236 mL    Refill:  0     Time spent:10 Minutes Time spent with patient included reviewing progress notes, labs, imaging studies, and discussing plan for follow up.  [place] Controlled Substance Database was reviewed by me for overdose risk score (ORS) if appropriate.  This patient was seen by Jonetta Osgood, FNP-C in collaboration with Dr. Clayborn Bigness as a part of collaborative care agreement.  Arlissa Monteverde R. Valetta Fuller, MSN, FNP-C Internal medicine

## 2021-04-07 ENCOUNTER — Telehealth: Payer: PPO | Admitting: Nurse Practitioner

## 2021-04-21 ENCOUNTER — Ambulatory Visit: Payer: PPO | Admitting: Physician Assistant

## 2021-04-23 ENCOUNTER — Other Ambulatory Visit: Payer: Self-pay | Admitting: Nurse Practitioner

## 2021-04-23 ENCOUNTER — Other Ambulatory Visit: Payer: Self-pay | Admitting: Internal Medicine

## 2021-04-23 DIAGNOSIS — E039 Hypothyroidism, unspecified: Secondary | ICD-10-CM

## 2021-04-23 DIAGNOSIS — G4709 Other insomnia: Secondary | ICD-10-CM

## 2021-04-23 DIAGNOSIS — I10 Essential (primary) hypertension: Secondary | ICD-10-CM

## 2021-04-24 NOTE — Telephone Encounter (Signed)
Med sent.

## 2021-05-03 ENCOUNTER — Telehealth: Payer: Self-pay

## 2021-05-03 MED ORDER — NITROFURANTOIN MONOHYD MACRO 100 MG PO CAPS: ORAL_CAPSULE | ORAL | 0 refills | Status: DC

## 2021-05-03 NOTE — Telephone Encounter (Signed)
Per DFK we can send in Macrobid 100 mg twice a day for 10 days

## 2021-05-03 NOTE — Telephone Encounter (Signed)
Pt called and c/o having urgency that started yesterday 05/03/21 and this morning woke up and was having pain and burning with urination and pressure.  Advised pt that we will talk with provider and get back with her.

## 2021-05-11 ENCOUNTER — Telehealth: Payer: Self-pay

## 2021-05-11 NOTE — Telephone Encounter (Signed)
Left vm and sent mychart message to confirm 05/13/21 appointment-Toni

## 2021-05-13 ENCOUNTER — Other Ambulatory Visit: Payer: Self-pay

## 2021-05-13 ENCOUNTER — Encounter: Payer: Self-pay | Admitting: Physician Assistant

## 2021-05-13 ENCOUNTER — Ambulatory Visit (INDEPENDENT_AMBULATORY_CARE_PROVIDER_SITE_OTHER): Payer: PPO | Admitting: Physician Assistant

## 2021-05-13 DIAGNOSIS — E782 Mixed hyperlipidemia: Secondary | ICD-10-CM

## 2021-05-13 DIAGNOSIS — Z1211 Encounter for screening for malignant neoplasm of colon: Secondary | ICD-10-CM | POA: Diagnosis not present

## 2021-05-13 DIAGNOSIS — I10 Essential (primary) hypertension: Secondary | ICD-10-CM | POA: Diagnosis not present

## 2021-05-13 DIAGNOSIS — R300 Dysuria: Secondary | ICD-10-CM | POA: Diagnosis not present

## 2021-05-13 DIAGNOSIS — G4709 Other insomnia: Secondary | ICD-10-CM | POA: Diagnosis not present

## 2021-05-13 DIAGNOSIS — Z1212 Encounter for screening for malignant neoplasm of rectum: Secondary | ICD-10-CM

## 2021-05-13 DIAGNOSIS — Z0001 Encounter for general adult medical examination with abnormal findings: Secondary | ICD-10-CM | POA: Diagnosis not present

## 2021-05-13 DIAGNOSIS — M25511 Pain in right shoulder: Secondary | ICD-10-CM

## 2021-05-13 DIAGNOSIS — E039 Hypothyroidism, unspecified: Secondary | ICD-10-CM | POA: Diagnosis not present

## 2021-05-13 DIAGNOSIS — G8929 Other chronic pain: Secondary | ICD-10-CM

## 2021-05-13 LAB — POCT URINALYSIS DIPSTICK
Bilirubin, UA: NEGATIVE
Glucose, UA: NEGATIVE
Leukocytes, UA: NEGATIVE
Nitrite, UA: NEGATIVE
Protein, UA: POSITIVE — AB
Spec Grav, UA: 1.01 (ref 1.010–1.025)
Urobilinogen, UA: 0.2 E.U./dL
pH, UA: 6.5 (ref 5.0–8.0)

## 2021-05-13 MED ORDER — AMLODIPINE BESYLATE 5 MG PO TABS: 5.0000 mg | ORAL_TABLET | Freq: Every day | ORAL | 1 refills | Status: DC

## 2021-05-13 MED ORDER — LEVOTHYROXINE SODIUM 88 MCG PO TABS: 88.0000 ug | ORAL_TABLET | Freq: Every day | ORAL | 0 refills | Status: DC

## 2021-05-13 MED ORDER — ROSUVASTATIN CALCIUM 5 MG PO TABS: ORAL_TABLET | ORAL | 3 refills | Status: DC

## 2021-05-13 MED ORDER — CIPROFLOXACIN HCL 500 MG PO TABS: 500.0000 mg | ORAL_TABLET | Freq: Two times a day (BID) | ORAL | 0 refills | Status: AC

## 2021-05-13 MED ORDER — ALPRAZOLAM 0.25 MG PO TABS: ORAL_TABLET | ORAL | 0 refills | Status: DC

## 2021-05-13 MED ORDER — IBUPROFEN 800 MG PO TABS: 800.0000 mg | ORAL_TABLET | Freq: Every day | ORAL | 0 refills | Status: DC | PRN

## 2021-05-13 NOTE — Progress Notes (Signed)
[facility] [address] [place], [place] [address]  Internal MEDICINE  Office Visit Note  Patient Name: Michelle [name]  [id number]  [id number]  Date of Service: 05/18/2021  Chief Complaint  Patient presents with   Medicare Wellness   Hypertension   Urinary Tract Infection    Still feels an urgency to urinate; pelvic pain     HPI Pt is here for routine health maintenance examination -Urology appt missed--went on the wrong day, so will reschedule after the 1st of the year. -Macrobid helped the pressure, but now still has urgency all week. No low back pain just lower abdominal. Will go ahead and treat based on symptoms and send culture, but pt advised she needs to f/u with urology -Taking ibuprofen daily of 800mg  for shoulder pain as advised by ortho. She understands that too much ibuprofen use can put her at risk for things like GI bleed and will only take as needed -1/2 tab xanax nightly -Stopped linzess because still wasnt having regular BM, taking OTC similar to miralax, and working to increase fiber -Mammogram done -Labs previously reviewed, but discussed again- low vitamin D and recommend supplementing OTC, lipids also elevated and will work on diet and exercise and start statin twice per week. Her potassium was also a little low and discussed that when she saw Alyssa for an acute visit she had ordered an updated potassium level that she will have done now. -Due for cologuard  Current Medication: Outpatient Encounter Medications as of 05/13/2021  Medication Sig   aspirin EC 81 MG tablet Take 81 mg by mouth daily.   ciprofloxacin (CIPRO) 500 MG tablet Take 1 tablet (500 mg total) by mouth 2 (two) times daily for 10 days.   diclofenac Sodium (VOLTAREN) 1 % GEL Apply 2 g topically 4 (four) times daily.   estradiol (ESTRACE) 0.5 MG tablet Take 1 tablet (0.5 mg total) by mouth daily.   ibuprofen (ADVIL) 800 MG tablet Take 1 tablet (800 mg total) by mouth daily as  needed.   rosuvastatin (CRESTOR) 5 MG tablet Take 1 tablet by mouth twice per week at night   [DISCONTINUED] ALPRAZolam (XANAX) 0.25 MG tablet TAKE 1 TABLET BY MOUTH EVERY DAY AT BEDTIME FOR INSOMNIA   [DISCONTINUED] amLODipine (NORVASC) 5 MG tablet Take 1 tablet (5 mg total) by mouth daily.   [DISCONTINUED] levothyroxine (SYNTHROID) 88 MCG tablet Take 1 tablet (88 mcg total) by mouth daily before breakfast.   [START ON 05/23/2021] ALPRAZolam (XANAX) 0.25 MG tablet TAKE 1 TABLET BY MOUTH EVERY DAY AT BEDTIME FOR INSOMNIA   amLODipine (NORVASC) 5 MG tablet Take 1 tablet (5 mg total) by mouth daily.   guaiFENesin-codeine (ROBITUSSIN AC) 100-10 MG/5ML syrup Take 5 mLs by mouth 3 (three) times daily as needed for cough or congestion. (Patient not taking: Reported on 05/13/2021)   levothyroxine (SYNTHROID) 88 MCG tablet Take 1 tablet (88 mcg total) by mouth daily before breakfast.   linaclotide (LINZESS) 290 MCG CAPS capsule Take 1 capsule (290 mcg total) by mouth daily before breakfast. (Patient not taking: Reported on 05/13/2021)   [DISCONTINUED] amoxicillin-clavulanate (AUGMENTIN) 875-125 MG tablet Take 1 tablet by mouth 2 (two) times daily. (Patient not taking: Reported on 05/13/2021)   [DISCONTINUED] nitrofurantoin, macrocrystal-monohydrate, (MACROBID) 100 MG capsule Take 1 capsule by mouth twice a day for 10 days (Patient not taking: Reported on 05/13/2021)   No facility-administered encounter medications on file as of 05/13/2021.    Surgical History: Past Surgical History:  Procedure  Laterality Date   child birth natural     diatation      Medical History: Past Medical History:  Diagnosis Date   Bladder incontinence    Hypertension    Hypothyroidism     Family History: Family History  Problem Relation Age of Onset   Cancer Father    Hyperlipidemia Father    Hypertension Father    Breast cancer Neg Hx       Review of Systems  Constitutional:  Negative for chills, fatigue and  unexpected weight change.  HENT:  Negative for congestion, rhinorrhea, sneezing and sore throat.   Eyes:  Negative for redness.  Respiratory:  Negative for cough, chest tightness and shortness of breath.   Cardiovascular:  Negative for chest pain and palpitations.  Gastrointestinal:  Negative for abdominal pain, constipation, diarrhea, nausea and vomiting.  Genitourinary:  Positive for dysuria, frequency, pelvic pain and urgency.  Musculoskeletal:  Positive for arthralgias. Negative for back pain, joint swelling and neck pain.  Skin:  Negative for rash.  Neurological: Negative.  Negative for tremors and numbness.  Hematological:  Negative for adenopathy. Does not bruise/bleed easily.  Psychiatric/Behavioral:  Positive for sleep disturbance. Negative for behavioral problems (Depression) and suicidal ideas. The patient is not nervous/anxious.     Vital Signs: BP 132/80 Comment: 144/83   Pulse 73    Temp 98.7 F (37.1 C)    Resp 16    Ht 5\' 2"  (1.575 m)    Wt 140 lb 3.2 oz (63.6 kg)    SpO2 98%    BMI 25.64 kg/m    Physical Exam Vitals and nursing note reviewed.  Constitutional:      General: She is not in acute distress.    Appearance: She is well-developed and normal weight. She is not diaphoretic.  HENT:     Head: Normocephalic and atraumatic.     Right Ear: External ear normal.     Left Ear: External ear normal.     Nose: Nose normal.     Mouth/Throat:     Pharynx: No oropharyngeal exudate.  Eyes:     General: No scleral icterus.       Right eye: No discharge.        Left eye: No discharge.     Conjunctiva/sclera: Conjunctivae normal.     Pupils: Pupils are equal, round, and reactive to light.  Neck:     Thyroid: No thyromegaly.     Vascular: No JVD.     Trachea: No tracheal deviation.  Cardiovascular:     Rate and Rhythm: Normal rate and regular rhythm.     Heart sounds: Normal heart sounds. No murmur heard.   No friction rub. No gallop.  Pulmonary:     Effort:  Pulmonary effort is normal. No respiratory distress.     Breath sounds: Normal breath sounds. No stridor. No wheezing or rales.  Chest:     Chest wall: No tenderness.  Abdominal:     General: Bowel sounds are normal. There is no distension.     Palpations: Abdomen is soft. There is no mass.     Tenderness: There is abdominal tenderness. There is no right CVA tenderness, left CVA tenderness, guarding or rebound.  Musculoskeletal:        General: No tenderness or deformity. Normal range of motion.     Cervical back: Normal range of motion and neck supple.  Lymphadenopathy:     Cervical: No cervical adenopathy.  Skin:  General: Skin is warm and dry.     Coloration: Skin is not pale.     Findings: No erythema or rash.  Neurological:     Mental Status: She is alert.     Cranial Nerves: No cranial nerve deficit.     Motor: No abnormal muscle tone.     Coordination: Coordination normal.     Deep Tendon Reflexes: Reflexes are normal and symmetric.  Psychiatric:        Behavior: Behavior normal.        Thought Content: Thought content normal.        Judgment: Judgment normal.     LABS: Recent Results (from the past 2160 hour(s))  POCT Urinalysis Dipstick     Status: Abnormal   Collection Time: 05/13/21 11:21 AM  Result Value Ref Range   Color, UA     Clarity, UA     Glucose, UA Negative Negative   Bilirubin, UA neg    Ketones, UA trace    Spec Grav, UA 1.010 1.010 - 1.025   Blood, UA trace    pH, UA 6.5 5.0 - 8.0   Protein, UA Positive (A) Negative   Urobilinogen, UA 0.2 0.2 or 1.0 E.U./dL   Nitrite, UA neg    Leukocytes, UA Negative Negative   Appearance     Odor          Assessment/Plan: 1. Encounter for general adult medical examination with abnormal findings CPE performed, discussed labs again, UTD on mammogram and will have cologuard ordered  2. Essential hypertension Stable, continue current medication - amLODipine (NORVASC) 5 MG tablet; Take 1 tablet (5 mg  total) by mouth daily.  Dispense: 90 tablet; Refill: 1  3. Acquired hypothyroidism Continue synthroid - levothyroxine (SYNTHROID) 88 MCG tablet; Take 1 tablet (88 mcg total) by mouth daily before breakfast.  Dispense: 90 tablet; Refill: 0  4. Other insomnia May continue 1/2 tab as needed before bed - ALPRAZolam (XANAX) 0.25 MG tablet; TAKE 1 TABLET BY MOUTH EVERY DAY AT BEDTIME FOR INSOMNIA  Dispense: 30 tablet; Refill: 0  5. Mixed hyperlipidemia Will start crestor twice per week and work to improve diet and exercise - rosuvastatin (CRESTOR) 5 MG tablet; Take 1 tablet by mouth twice per week at night  Dispense: 90 tablet; Refill: 3  6. Chronic right shoulder pain May take ibuprofen as needed as advised by ortho--advised to take only as needed and monitor for any signs of GI bleed - ibuprofen (ADVIL) 800 MG tablet; Take 1 tablet (800 mg total) by mouth daily as needed.  Dispense: 30 tablet; Refill: 0  7. Encounter for colorectal cancer screening - Cologuard  8. Dysuria Will start cipro based on symptoms and send culture and adjust baed on C/S--will need to f/u with urology - POCT Urinalysis Dipstick - CULTURE, URINE COMPREHENSIVE - ciprofloxacin (CIPRO) 500 MG tablet; Take 1 tablet (500 mg total) by mouth 2 (two) times daily for 10 days.  Dispense: 20 tablet; Refill: 0   General Counseling: nguyen butler understanding of the findings of todays visit and agrees with plan of treatment. I have discussed any further diagnostic evaluation that may be needed or ordered today. We also reviewed her medications today. she has been encouraged to call the office with any questions or concerns that should arise related to todays visit.    Counseling:    Orders Placed This Encounter  Procedures   CULTURE, URINE COMPREHENSIVE   Cologuard   POCT Urinalysis Dipstick  Meds ordered this encounter  Medications   ciprofloxacin (CIPRO) 500 MG tablet    Sig: Take 1 tablet (500 mg total)  by mouth 2 (two) times daily for 10 days.    Dispense:  20 tablet    Refill:  0   amLODipine (NORVASC) 5 MG tablet    Sig: Take 1 tablet (5 mg total) by mouth daily.    Dispense:  90 tablet    Refill:  1   levothyroxine (SYNTHROID) 88 MCG tablet    Sig: Take 1 tablet (88 mcg total) by mouth daily before breakfast.    Dispense:  90 tablet    Refill:  0    Please note reduced dose.   ALPRAZolam (XANAX) 0.25 MG tablet    Sig: TAKE 1 TABLET BY MOUTH EVERY DAY AT BEDTIME FOR INSOMNIA    Dispense:  30 tablet    Refill:  0   rosuvastatin (CRESTOR) 5 MG tablet    Sig: Take 1 tablet by mouth twice per week at night    Dispense:  90 tablet    Refill:  3   ibuprofen (ADVIL) 800 MG tablet    Sig: Take 1 tablet (800 mg total) by mouth daily as needed.    Dispense:  30 tablet    Refill:  0    This patient was seen by Lynn Ito, PA-C in collaboration with Dr. Beverely Risen as a part of collaborative care agreement.  Total time spent:35 Minutes  Time spent includes review of chart, medications, test results, and follow up plan with the patient.     [name], MD  Internal Medicine

## 2021-05-23 ENCOUNTER — Other Ambulatory Visit: Payer: Self-pay | Admitting: Physician Assistant

## 2021-05-23 DIAGNOSIS — N3000 Acute cystitis without hematuria: Secondary | ICD-10-CM

## 2021-05-23 DIAGNOSIS — Z1211 Encounter for screening for malignant neoplasm of colon: Secondary | ICD-10-CM | POA: Diagnosis not present

## 2021-05-23 DIAGNOSIS — Z1212 Encounter for screening for malignant neoplasm of rectum: Secondary | ICD-10-CM | POA: Diagnosis not present

## 2021-05-23 LAB — CULTURE, URINE COMPREHENSIVE

## 2021-05-23 MED ORDER — NITROFURANTOIN MONOHYD MACRO 100 MG PO CAPS: ORAL_CAPSULE | ORAL | 0 refills | Status: DC

## 2021-05-24 ENCOUNTER — Telehealth: Payer: Self-pay

## 2021-05-24 NOTE — Telephone Encounter (Signed)
-----   Message from Carlean Jews, PA-C sent at 05/23/2021  4:53 PM EST ----- Please let her know that her C/S came back resistant to the ABX she was on therefore will send another round of macrobid which is sensitive

## 2021-05-24 NOTE — Telephone Encounter (Signed)
LMOM that we sent another abx to pharmacy that is sensitive to the UTI she has and to stop the other one.

## 2021-05-30 LAB — COLOGUARD: COLOGUARD: NEGATIVE

## 2021-06-08 ENCOUNTER — Encounter: Payer: Self-pay | Admitting: Urology

## 2021-06-08 ENCOUNTER — Ambulatory Visit: Payer: PPO | Admitting: Urology

## 2021-06-08 ENCOUNTER — Other Ambulatory Visit: Payer: Self-pay

## 2021-06-08 VITALS — BP 145/85 | HR 80 | Ht 62.0 in | Wt 138.0 lb

## 2021-06-08 DIAGNOSIS — N1339 Other hydronephrosis: Secondary | ICD-10-CM | POA: Diagnosis not present

## 2021-06-08 DIAGNOSIS — N390 Urinary tract infection, site not specified: Secondary | ICD-10-CM | POA: Diagnosis not present

## 2021-06-08 MED ORDER — ESTRADIOL 0.1 MG/GM VA CREA
TOPICAL_CREAM | VAGINAL | 12 refills | Status: DC
Start: 2021-06-08 — End: 2022-08-23

## 2021-06-08 NOTE — Progress Notes (Signed)
° °  06/08/2021 11:47 AM   Michelle [name] 1951-01-19 [id number]  Reason for visit: Follow up right hydroureteronephrosis, recurrent UTIs  HPI: 71 year old female I have followed for the above issues.  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  A nuc med renal scan was ordered and showed 61% function on the left and 39% function on the right with no evidence of high-grade obstructive uropathy.  Renal function has remained normal, and she is deferred further evaluation with cystoscopy, retrograde, possible balloon dilation and stent placement.  She has also had problems with UTIs starting in January 2022.  At our last visit in June 2022 she was started on Premarin cream, as well as prophylactic nitrofurantoin for 3 months.  She has done relatively well since that time with only a single UTI on 05/13/2021, with culture documented Klebsiella.  Her UTI symptoms resolved with antibiotics.  She has had some problems with persistent stress incontinence, and GYN has been managing her pessary.  We again discussed options including observation and continuing the topical estrogen cream, low-dose daily prophylaxis, or cystoscopy/right retrograde pyelogram/balloon dilation/stent placement.  With her renal function remaining normal and no flank pain and only a single UTI over the 6 months she would like to continue the topical estrogen cream.  I think this is reasonable, and return precautions were discussed extensively.  If she has persistent UTIs 2-3 times per year or more, I do think she would benefit from possible balloon dilation of the right distal ureter.  -Continue topical estrogen cream -RTC 6 months symptom check -Consider cystoscopy, right retrograde pyelogram, balloon dilation of distal ureter, and stent placement if recurrent infections despite above   Billey Co,  MD  [facility] [address] [place], [place] [address] [phone]

## 2021-06-26 ENCOUNTER — Other Ambulatory Visit: Payer: Self-pay

## 2021-06-26 MED ORDER — CIPROFLOXACIN HCL 500 MG PO TABS: 500.0000 mg | ORAL_TABLET | Freq: Two times a day (BID) | ORAL | 0 refills | Status: DC

## 2021-06-26 NOTE — Telephone Encounter (Signed)
Pt call c/o having urgency and pressure In bottom of stomach and pain in lower back that started on Friday along with sinus issues, sore throat, took covid test and was negative.  Per Lauren we sent in Cipro 500 mg twice a day for 7 days.

## 2021-07-01 ENCOUNTER — Telehealth: Payer: Self-pay

## 2021-07-01 ENCOUNTER — Other Ambulatory Visit: Payer: Self-pay

## 2021-07-01 DIAGNOSIS — N3000 Acute cystitis without hematuria: Secondary | ICD-10-CM

## 2021-07-01 MED ORDER — NITROFURANTOIN MONOHYD MACRO 100 MG PO CAPS
ORAL_CAPSULE | ORAL | 0 refills | Status: DC
Start: 2021-07-01 — End: 2021-07-18

## 2021-07-01 NOTE — Telephone Encounter (Signed)
06/26/21 pt called c/o having UTI symptoms with pressure in lower abdomen and lower back pain.  Also she c/o having some ear and sinus symptoms also.  I spoke to Lauren and she advised to send Cipro 500 mg twice a day for 10 days.  Informed pt I sent to her pharmacy

## 2021-07-01 NOTE — Telephone Encounter (Signed)
Pt called that we called cipro last weekend its not help at all for Uti advised her to stopped cipro and we send macrobid for 7 days and advised her to follow up urology

## 2021-07-07 ENCOUNTER — Telehealth: Payer: Self-pay | Admitting: Urology

## 2021-07-07 DIAGNOSIS — N390 Urinary tract infection, site not specified: Secondary | ICD-10-CM

## 2021-07-07 NOTE — Telephone Encounter (Signed)
Please schedule pt for UTI appointment with Richardo Hanks or Carollee Herter.

## 2021-07-07 NOTE — Telephone Encounter (Signed)
Pt said Dr. Richardo Hanks had put her on a preventive antibiotic (she forgot the name) and would like to know if he would call her in a prescription, as she has had a uti for the last 2 weeks. Pt would like a call back, informed pt she may not get a call back today. She is currently on Nitrofurantoin and said she takes the last dose tomorrow.

## 2021-07-13 NOTE — Telephone Encounter (Signed)
Pt's pcp prescribed Macrobid for her and would like to be put on a supplemental antibiotic.  She wants to know if Dr Richardo Hanks will send in [facility] for her.

## 2021-07-14 NOTE — Telephone Encounter (Signed)
Okay to send in nitrofurantoin 50 mg daily x4 months, and follow-up with me at that time.  She also has a known ureteral stricture, and if recurrent infections we need to reconsider balloon dilation of the ureter as we have discussed before in clinic  Legrand Rams, MD 07/14/2021

## 2021-07-14 NOTE — Telephone Encounter (Signed)
Last OV note mentions daily suppressive antibiotic, pt is interested in starting that as she has had another infection (treated by PCP) please advise.

## 2021-07-18 MED ORDER — NITROFURANTOIN MACROCRYSTAL 50 MG PO CAPS: 50.0000 mg | ORAL_CAPSULE | Freq: Every day | ORAL | 0 refills | Status: DC

## 2021-07-18 NOTE — Telephone Encounter (Signed)
RX sent. Appt scheduled.

## 2021-07-18 NOTE — Addendum Note (Signed)
Addended by: Frankey Shown on: 07/18/2021 03:26 PM   Modules accepted: Orders

## 2021-07-26 ENCOUNTER — Other Ambulatory Visit: Payer: Self-pay | Admitting: Nurse Practitioner

## 2021-07-26 ENCOUNTER — Other Ambulatory Visit: Payer: Self-pay | Admitting: Physician Assistant

## 2021-07-26 DIAGNOSIS — G4709 Other insomnia: Secondary | ICD-10-CM

## 2021-07-26 DIAGNOSIS — G8929 Other chronic pain: Secondary | ICD-10-CM

## 2021-07-27 NOTE — Telephone Encounter (Signed)
Increased risk of nephrotoxicity and GI bleed with this high dose

## 2021-08-02 ENCOUNTER — Other Ambulatory Visit: Payer: Self-pay | Admitting: Podiatry

## 2021-08-02 ENCOUNTER — Ambulatory Visit: Payer: PPO | Admitting: Podiatry

## 2021-08-02 ENCOUNTER — Encounter: Payer: Self-pay | Admitting: Podiatry

## 2021-08-02 ENCOUNTER — Other Ambulatory Visit: Payer: Self-pay

## 2021-08-02 ENCOUNTER — Ambulatory Visit (INDEPENDENT_AMBULATORY_CARE_PROVIDER_SITE_OTHER): Payer: PPO

## 2021-08-02 DIAGNOSIS — M778 Other enthesopathies, not elsewhere classified: Secondary | ICD-10-CM

## 2021-08-02 DIAGNOSIS — M7742 Metatarsalgia, left foot: Secondary | ICD-10-CM

## 2021-08-02 NOTE — Progress Notes (Signed)
? ?  HPI: 71 y.o. female presenting today as a new patient for evaluation of pain and tenderness to the left forefoot.  Patient states that after being on her feet all day she has significant pain and tenderness associated to the left forefoot.  She denies a history of injury.  She has been applying metatarsal pads and soaking her foot in Epsom salt water.  Presenting for further treatment and evaluation ? ?Past Medical History:  ?Diagnosis Date  ? Bladder incontinence   ? Hypertension   ? Hypothyroidism   ? ? ?Past Surgical History:  ?Procedure Laterality Date  ? child birth natural    ? diatation    ? ? ?Allergies  ?Allergen Reactions  ? Sulfa Antibiotics Hives and Nausea And Vomiting  ? ?  ?Physical Exam: ?General: The patient is alert and oriented x3 in no acute distress. ? ?Dermatology: Skin is warm, dry and supple bilateral lower extremities. Negative for open lesions or macerations. ? ?Vascular: Palpable pedal pulses bilaterally. Capillary refill within normal limits.  Negative for any significant edema or erythema ? ?Neurological: Light touch and protective threshold grossly intact ? ?Musculoskeletal Exam: No pedal deformities noted.  There is some tenderness to palpation throughout the entire forefoot at the MTP left foot ? ?Radiographic Exam:  ?Normal osseous mineralization. Joint spaces preserved. No fracture/dislocation/boney destruction.   ? ?Assessment: ?1.  Metatarsalgia left forefoot ? ? ?Plan of Care:  ?1. Patient evaluated. X-Rays reviewed.  ?2.  Today we discussed different conservative treatment options including oral anti-inflammatories such as the patient declined and also custom orthotics/arch supports to support the medial longitudinal arch of the foot and offload pressure from the forefoot. ?3.  The patient states that she would like to think about the custom orthotics ?4.  In the meantime continue good supportive shoes and sneakers ?5.  Return to clinic as needed ? ?  ?  ?Felecia Shelling,  DPM ?[facility] ? ?Dr. Felecia Shelling, DPM  ?  ?[address]                                        ?[place], [place] [address]                ?Office [phone]  ?Fax [phone] ? ? ? ? ?

## 2021-08-11 ENCOUNTER — Ambulatory Visit (INDEPENDENT_AMBULATORY_CARE_PROVIDER_SITE_OTHER): Payer: PPO | Admitting: Internal Medicine

## 2021-08-11 ENCOUNTER — Encounter: Payer: Self-pay | Admitting: Internal Medicine

## 2021-08-11 ENCOUNTER — Other Ambulatory Visit: Payer: Self-pay

## 2021-08-11 VITALS — BP 133/84 | HR 76 | Temp 97.9°F | Resp 16 | Ht 62.0 in | Wt 141.2 lb

## 2021-08-11 DIAGNOSIS — N390 Urinary tract infection, site not specified: Secondary | ICD-10-CM

## 2021-08-11 DIAGNOSIS — I10 Essential (primary) hypertension: Secondary | ICD-10-CM | POA: Diagnosis not present

## 2021-08-11 DIAGNOSIS — G4709 Other insomnia: Secondary | ICD-10-CM

## 2021-08-11 DIAGNOSIS — E039 Hypothyroidism, unspecified: Secondary | ICD-10-CM | POA: Diagnosis not present

## 2021-08-11 DIAGNOSIS — N951 Menopausal and female climacteric states: Secondary | ICD-10-CM

## 2021-08-11 DIAGNOSIS — I700 Atherosclerosis of aorta: Secondary | ICD-10-CM | POA: Diagnosis not present

## 2021-08-11 MED ORDER — ALPRAZOLAM 0.25 MG PO TABS: ORAL_TABLET | ORAL | 2 refills | Status: DC

## 2021-08-11 MED ORDER — ROSUVASTATIN CALCIUM 5 MG PO TABS: ORAL_TABLET | ORAL | 3 refills | Status: DC

## 2021-08-11 MED ORDER — AMLODIPINE BESYLATE 5 MG PO TABS: 5.0000 mg | ORAL_TABLET | Freq: Every day | ORAL | 1 refills | Status: DC

## 2021-08-11 MED ORDER — LEVOTHYROXINE SODIUM 88 MCG PO TABS: 88.0000 ug | ORAL_TABLET | Freq: Every day | ORAL | 0 refills | Status: DC

## 2021-08-11 MED ORDER — ESTRADIOL 0.5 MG PO TABS: 0.5000 mg | ORAL_TABLET | Freq: Every day | ORAL | 1 refills | Status: DC

## 2021-08-11 NOTE — Progress Notes (Signed)
[facility] ?[address] ?[place], [place] [id number] ? ?Internal MEDICINE  ?Office Visit Note ? ?Patient Name: Michelle BealsFrances M Beg ? [id number]  ?[id number] ? ?Date of Service: 08/22/2021 ? ?Chief Complaint  ?Patient presents with  ? Follow-up  ? Hypertension  ? Quality Metric Gaps  ?  Shingles and Pneumonia Vaccine  ? ? ?HPI ? ?Patient is here for routine follow-up overall feels well recently as she has seen urology for recurrent UTI.  She was started on chronic suppressive therapy with Macrobid and has done well. ?Patient is waiting on Cologuard she has not done it yet. ?Patient would like to have all her refills today. ?Patient is due for shingles and pneumonia vaccine  ? ? ?Current Medication: ?Outpatient Encounter Medications as of 08/11/2021  ?Medication Sig  ? aspirin EC 81 MG tablet Take 81 mg by mouth daily.  ? diclofenac Sodium (VOLTAREN) 1 % GEL Apply 2 g topically 4 (four) times daily.  ? estradiol (ESTRACE) 0.1 MG/GM vaginal cream Estrogen Cream Instruction Discard applicator Apply pea sized amount to tip of finger to urethra before bed. Wash hands well after application. Use Monday, Wednesday and Friday  ? ibuprofen (ADVIL) 800 MG tablet Take 1 tablet (800 mg total) by mouth daily as needed.  ? nitrofurantoin (MACRODANTIN) 50 MG capsule Take 1 capsule (50 mg total) by mouth daily.  ? rosuvastatin (CRESTOR) 5 MG tablet Take one tab 2 x a week for atherosclerosis  ? [DISCONTINUED] ALPRAZolam (XANAX) 0.25 MG tablet TAKE 1 TABLET BY MOUTH EVERY DAY AT BEDTIME FOR INSOMNIA  ? [DISCONTINUED] amLODipine (NORVASC) 5 MG tablet Take 1 tablet (5 mg total) by mouth daily.  ? [DISCONTINUED] estradiol (ESTRACE) 0.5 MG tablet Take 1 tablet (0.5 mg total) by mouth daily.  ? [DISCONTINUED] levothyroxine (SYNTHROID) 88 MCG tablet Take 1 tablet (88 mcg total) by mouth daily before breakfast.  ? ALPRAZolam (XANAX) 0.25 MG tablet Take one tab po qhs prn for anxiety  ? amLODipine (NORVASC) 5 MG tablet Take 1 tablet (5  mg total) by mouth daily.  ? estradiol (ESTRACE) 0.5 MG tablet Take 1 tablet (0.5 mg total) by mouth daily.  ? levothyroxine (SYNTHROID) 88 MCG tablet Take 1 tablet (88 mcg total) by mouth daily before breakfast.  ? ?No facility-administered encounter medications on file as of 08/11/2021.  ? ? ?Surgical History: ?Past Surgical History:  ?Procedure Laterality Date  ? child birth natural    ? diatation    ? ? ?Medical History: ?Past Medical History:  ?Diagnosis Date  ? Bladder incontinence   ? Hypertension   ? Hypothyroidism   ? ? ?Family History: ?Family History  ?Problem Relation Age of Onset  ? Cancer Father   ? Hyperlipidemia Father   ? Hypertension Father   ? Breast cancer Neg Hx   ? ? ?Social History  ? ?Socioeconomic History  ? Marital status: Married  ?  Spouse name: Not on file  ? Number of children: Not on file  ? Years of education: Not on file  ? Highest education level: Not on file  ?Occupational History  ? Not on file  ?Tobacco Use  ? Smoking status: Former  ?  Types: Cigarettes  ? Smokeless tobacco: Never  ?Substance and Sexual Activity  ? Alcohol use: No  ? Drug use: No  ? Sexual activity: Not Currently  ?  Birth control/protection: Surgical, Post-menopausal  ?  Comment: Hysterectomy  ?Other Topics Concern  ? Not on file  ?Social History Narrative  ?  Not on file  ? ?Social Determinants of Health  ? ?Financial Resource Strain: Not on file  ?Food Insecurity: Not on file  ?Transportation Needs: Not on file  ?Physical Activity: Not on file  ?Stress: Not on file  ?Social Connections: Not on file  ?Intimate Partner Violence: Not on file  ? ? ? ? ?Review of Systems  ?Constitutional:  Negative for chills, fatigue and unexpected weight change.  ?HENT:  Positive for postnasal drip. Negative for congestion, rhinorrhea, sneezing and sore throat.   ?Eyes:  Negative for redness.  ?Respiratory:  Negative for cough, chest tightness and shortness of breath.   ?Cardiovascular:  Negative for chest pain and palpitations.   ?Gastrointestinal:  Negative for abdominal pain, constipation, diarrhea, nausea and vomiting.  ?Genitourinary:  Negative for dysuria and frequency.  ?Musculoskeletal:  Negative for arthralgias, back pain, joint swelling and neck pain.  ?Skin:  Negative for rash.  ?Neurological: Negative.  Negative for tremors and numbness.  ?Hematological:  Negative for adenopathy. Does not bruise/bleed easily.  ?Psychiatric/Behavioral:  Negative for behavioral problems (Depression), sleep disturbance and suicidal ideas. The patient is not nervous/anxious.   ? ?Vital Signs: ?BP 133/84   Pulse 76   Temp 97.9 ?F (36.6 ?C)   Resp 16   Ht 5\' 2"  (1.575 m)   Wt 141 lb 3.2 oz (64 kg)   SpO2 98%   BMI 25.83 kg/m?  ? ? ?Physical Exam ?Constitutional:   ?   Appearance: Normal appearance.  ?HENT:  ?   Head: Normocephalic and atraumatic.  ?   Nose: Nose normal.  ?   Mouth/Throat:  ?   Mouth: Mucous membranes are moist.  ?   Pharynx: No posterior oropharyngeal erythema.  ?Eyes:  ?   Extraocular Movements: Extraocular movements intact.  ?   Pupils: Pupils are equal, round, and reactive to light.  ?Cardiovascular:  ?   Pulses: Normal pulses.  ?   Heart sounds: Normal heart sounds.  ?Pulmonary:  ?   Effort: Pulmonary effort is normal.  ?   Breath sounds: Normal breath sounds.  ?Neurological:  ?   General: No focal deficit present.  ?   Mental Status: She is alert.  ?Psychiatric:     ?   Mood and Affect: Mood normal.     ?   Behavior: Behavior normal.  ? ? ? ? ? ?Assessment/Plan: ?1. Essential hypertension ?Blood pressure is under good control we will continue on Norvasc as before ?- amLODipine (NORVASC) 5 MG tablet; Take 1 tablet (5 mg total) by mouth daily.  Dispense: 90 tablet; Refill: 1 ? ?2. Acquired hypothyroidism ?Continue on Synthroid ?- levothyroxine (SYNTHROID) 88 MCG tablet; Take 1 tablet (88 mcg total) by mouth daily before breakfast.  Dispense: 90 tablet; Refill: 0 ? ?3. Vasomotor symptoms due to menopause ?Patient does not want  to go off of HRT we will continue to on low-dose hormone replacement therapy ?- estradiol (ESTRACE) 0.5 MG tablet; Take 1 tablet (0.5 mg total) by mouth daily.  Dispense: 90 tablet; Refill: 1 ? ?4. Aortic atherosclerosis (HCC) ?Explained to the patient that Crestor is needed for prevention of any CV incidents and she needs to continue Crestor as prescribed, patient might need another study like carotids to make sure that she does not have widespread atherosclerosis ?- rosuvastatin (CRESTOR) 5 MG tablet; Take one tab 2 x a week for atherosclerosis  Dispense: 24 tablet; Refill: 3 ? ?5. Other insomnia ?Continue Xanax as needed ?- ALPRAZolam (XANAX) 0.25 MG tablet;  Take one tab po qhs prn for anxiety  Dispense: 30 tablet; Refill: 2 ? ?6. Recurrent UTI (urinary tract infection) ?Followed by urology patient is on chronic suppressive therapy for next few months ? ?General Counseling: lyniah fujita understanding of the findings of todays visit and agrees with plan of treatment. I have discussed any further diagnostic evaluation that may be needed or ordered today. We also reviewed her medications today. she has been encouraged to call the office with any questions or concerns that should arise related to todays visit. ? ? ? ?No orders of the defined types were placed in this encounter. ? ? ?Meds ordered this encounter  ?Medications  ? rosuvastatin (CRESTOR) 5 MG tablet  ?  Sig: Take one tab 2 x a week for atherosclerosis  ?  Dispense:  24 tablet  ?  Refill:  3  ? estradiol (ESTRACE) 0.5 MG tablet  ?  Sig: Take 1 tablet (0.5 mg total) by mouth daily.  ?  Dispense:  90 tablet  ?  Refill:  1  ? amLODipine (NORVASC) 5 MG tablet  ?  Sig: Take 1 tablet (5 mg total) by mouth daily.  ?  Dispense:  90 tablet  ?  Refill:  1  ? levothyroxine (SYNTHROID) 88 MCG tablet  ?  Sig: Take 1 tablet (88 mcg total) by mouth daily before breakfast.  ?  Dispense:  90 tablet  ?  Refill:  0  ?  Please note reduced dose.  ? ALPRAZolam (XANAX)  0.25 MG tablet  ?  Sig: Take one tab po qhs prn for anxiety  ?  Dispense:  30 tablet  ?  Refill:  2  ? ? ?Total time spent:35 Minutes ?Time spent includes review of chart, medications, test results, and follow

## 2021-08-23 ENCOUNTER — Other Ambulatory Visit: Payer: Self-pay

## 2021-08-23 ENCOUNTER — Telehealth: Payer: Self-pay

## 2021-08-23 NOTE — Telephone Encounter (Signed)
Lmom to call us back 

## 2021-09-21 ENCOUNTER — Telehealth: Payer: Self-pay

## 2021-09-21 NOTE — Telephone Encounter (Signed)
Spoke with pt  about pneumonia and shingles vaccine she already had shingles vaccine and also she was not sure about pneumonia we waiting for [facility] to fax [place] immunization records and we can send pres for vaccine after we get immunization  ?

## 2021-12-03 ENCOUNTER — Other Ambulatory Visit: Payer: Self-pay | Admitting: Internal Medicine

## 2021-12-03 DIAGNOSIS — E039 Hypothyroidism, unspecified: Secondary | ICD-10-CM

## 2021-12-04 NOTE — Telephone Encounter (Signed)
Should we see her for f/u. Was seen in march, pt needs app in August

## 2021-12-07 ENCOUNTER — Ambulatory Visit: Payer: PPO | Admitting: Urology

## 2021-12-08 ENCOUNTER — Encounter: Payer: Self-pay | Admitting: Urology

## 2022-02-27 ENCOUNTER — Ambulatory Visit (INDEPENDENT_AMBULATORY_CARE_PROVIDER_SITE_OTHER): Payer: PPO | Admitting: Physician Assistant

## 2022-02-27 ENCOUNTER — Encounter: Payer: Self-pay | Admitting: Physician Assistant

## 2022-02-27 VITALS — BP 140/78 | HR 82 | Temp 97.1°F | Resp 16 | Ht 62.0 in | Wt 142.2 lb

## 2022-02-27 DIAGNOSIS — E039 Hypothyroidism, unspecified: Secondary | ICD-10-CM | POA: Diagnosis not present

## 2022-02-27 DIAGNOSIS — I700 Atherosclerosis of aorta: Secondary | ICD-10-CM | POA: Diagnosis not present

## 2022-02-27 DIAGNOSIS — E782 Mixed hyperlipidemia: Secondary | ICD-10-CM | POA: Diagnosis not present

## 2022-02-27 DIAGNOSIS — M25512 Pain in left shoulder: Secondary | ICD-10-CM | POA: Diagnosis not present

## 2022-02-27 DIAGNOSIS — Z23 Encounter for immunization: Secondary | ICD-10-CM

## 2022-02-27 DIAGNOSIS — I10 Essential (primary) hypertension: Secondary | ICD-10-CM

## 2022-02-27 DIAGNOSIS — M25511 Pain in right shoulder: Secondary | ICD-10-CM

## 2022-02-27 DIAGNOSIS — R5383 Other fatigue: Secondary | ICD-10-CM | POA: Diagnosis not present

## 2022-02-27 DIAGNOSIS — G4709 Other insomnia: Secondary | ICD-10-CM

## 2022-02-27 DIAGNOSIS — N951 Menopausal and female climacteric states: Secondary | ICD-10-CM

## 2022-02-27 DIAGNOSIS — E559 Vitamin D deficiency, unspecified: Secondary | ICD-10-CM | POA: Diagnosis not present

## 2022-02-27 DIAGNOSIS — E538 Deficiency of other specified B group vitamins: Secondary | ICD-10-CM | POA: Diagnosis not present

## 2022-02-27 DIAGNOSIS — G8929 Other chronic pain: Secondary | ICD-10-CM | POA: Diagnosis not present

## 2022-02-27 MED ORDER — PNEUMOCOCCAL 20-VAL CONJ VACC 0.5 ML IM SUSY: 0.5000 mL | PREFILLED_SYRINGE | INTRAMUSCULAR | 0 refills | Status: AC

## 2022-02-27 MED ORDER — LEVOTHYROXINE SODIUM 88 MCG PO TABS: 88.0000 ug | ORAL_TABLET | Freq: Every day | ORAL | 0 refills | Status: DC

## 2022-02-27 MED ORDER — AMLODIPINE BESYLATE 5 MG PO TABS: 5.0000 mg | ORAL_TABLET | Freq: Every day | ORAL | 1 refills | Status: DC

## 2022-02-27 MED ORDER — ESTRADIOL 0.5 MG PO TABS: 0.5000 mg | ORAL_TABLET | Freq: Every day | ORAL | 1 refills | Status: DC

## 2022-02-27 MED ORDER — ZOSTER VAC RECOMB ADJUVANTED 50 MCG/0.5ML IM SUSR: 0.5000 mL | Freq: Once | INTRAMUSCULAR | 0 refills | Status: AC

## 2022-02-27 MED ORDER — ROSUVASTATIN CALCIUM 5 MG PO TABS: ORAL_TABLET | ORAL | 3 refills | Status: DC

## 2022-02-27 MED ORDER — ALPRAZOLAM 0.25 MG PO TABS: ORAL_TABLET | ORAL | 2 refills | Status: DC

## 2022-02-27 NOTE — Progress Notes (Signed)
[facility] [address] [place], [place] [address]  Internal MEDICINE  Office Visit Note  Patient Name: Michelle [name]  [id number]  [id number]  Date of Service: 03/03/2022  Chief Complaint  Patient presents with   Follow-up   Hypertension    HPI Pt is here for routine follow up and is doing well -Taking 1/2 tablet xanax at night -Shoulder pain bilaterally, taking 800mg  ibuprofren every other day. Has done meloxicam previously as well. Neither fully help. May consider ortho. Advised against high doses of NSAIDS due to GI risks and she understands to only use as needed. -Not taking macrobid anymore -BP at home about the same. If rising advised to take extra 1/2 tab amlodipine and notify office.  -due for labs and med refills -du for prevnar 20 PNA vaccine  Current Medication: Outpatient Encounter Medications as of 02/27/2022  Medication Sig   aspirin EC 81 MG tablet Take 81 mg by mouth daily.   diclofenac Sodium (VOLTAREN) 1 % GEL Apply 2 g topically 4 (four) times daily.   estradiol (ESTRACE) 0.1 MG/GM vaginal cream Estrogen Cream Instruction Discard applicator Apply pea sized amount to tip of finger to urethra before bed. Wash hands well after application. Use Monday, Wednesday and Friday   ibuprofen (ADVIL) 800 MG tablet Take 1 tablet (800 mg total) by mouth daily as needed.   nitrofurantoin (MACRODANTIN) 50 MG capsule Take 1 capsule (50 mg total) by mouth daily.   [DISCONTINUED] ALPRAZolam (XANAX) 0.25 MG tablet Take one tab po qhs prn for anxiety   [DISCONTINUED] amLODipine (NORVASC) 5 MG tablet Take 1 tablet (5 mg total) by mouth daily.   [DISCONTINUED] estradiol (ESTRACE) 0.5 MG tablet Take 1 tablet (0.5 mg total) by mouth daily.   [DISCONTINUED] levothyroxine (SYNTHROID) 88 MCG tablet TAKE 1 TABLET BY MOUTH ONCE DAILY BEFORE BREAKFAST   [DISCONTINUED] pneumococcal 20-valent conjugate vaccine (PREVNAR 20) 0.5 ML injection Inject 0.5 mLs into the muscle  tomorrow at 10 am.   [DISCONTINUED] rosuvastatin (CRESTOR) 5 MG tablet Take one tab 2 x a week for atherosclerosis   [DISCONTINUED] Zoster Vaccine Adjuvanted [facility]) injection Inject 0.5 mLs into the muscle once.   ALPRAZolam (XANAX) 0.25 MG tablet Take one tab po qhs prn for anxiety   amLODipine (NORVASC) 5 MG tablet Take 1 tablet (5 mg total) by mouth daily.   estradiol (ESTRACE) 0.5 MG tablet Take 1 tablet (0.5 mg total) by mouth daily.   levothyroxine (SYNTHROID) 88 MCG tablet Take 1 tablet (88 mcg total) by mouth daily before breakfast.   [EXPIRED] pneumococcal 20-valent conjugate vaccine (PREVNAR 20) 0.5 ML injection Inject 0.5 mLs into the muscle tomorrow at 10 am for 1 dose.   rosuvastatin (CRESTOR) 5 MG tablet Take one tab 2 x a week for atherosclerosis   [EXPIRED] Zoster Vaccine Adjuvanted [facility]) injection Inject 0.5 mLs into the muscle once for 1 dose.   No facility-administered encounter medications on file as of 02/27/2022.    Surgical History: Past Surgical History:  Procedure Laterality Date   child birth natural     diatation      Medical History: Past Medical History:  Diagnosis Date   Bladder incontinence    Hypertension    Hypothyroidism     Family History: Family History  Problem Relation Age of Onset   Cancer Father    Hyperlipidemia Father    Hypertension Father    Breast cancer Neg Hx     Social History   Socioeconomic History  Marital status: Married    Spouse name: Not on file   Number of children: Not on file   Years of education: Not on file   Highest education level: Not on file  Occupational History   Not on file  Tobacco Use   Smoking status: Former    Types: Cigarettes   Smokeless tobacco: Never  Substance and Sexual Activity   Alcohol use: No   Drug use: No   Sexual activity: Not Currently    Birth control/protection: Surgical, Post-menopausal    Comment: Hysterectomy  Other Topics Concern   Not on file  Social History  Narrative   Not on file   Social Determinants of Health   Financial Resource Strain: Not on file  Food Insecurity: Not on file  Transportation Needs: Not on file  Physical Activity: Not on file  Stress: Not on file  Social Connections: Not on file  Intimate Partner Violence: Not on file      Review of Systems  Constitutional:  Negative for chills, fatigue and unexpected weight change.  HENT:  Negative for congestion, postnasal drip, rhinorrhea, sneezing and sore throat.   Eyes:  Negative for redness.  Respiratory:  Negative for cough, chest tightness and shortness of breath.   Cardiovascular:  Negative for chest pain and palpitations.  Gastrointestinal:  Negative for abdominal pain, constipation, diarrhea, nausea and vomiting.  Genitourinary:  Negative for dysuria and frequency.  Musculoskeletal:  Positive for arthralgias. Negative for back pain, joint swelling and neck pain.  Skin:  Negative for rash.  Neurological: Negative.  Negative for tremors and numbness.  Hematological:  Negative for adenopathy. Does not bruise/bleed easily.  Psychiatric/Behavioral:  Negative for behavioral problems (Depression), sleep disturbance and suicidal ideas. The patient is not nervous/anxious.     Vital Signs: BP (!) 140/78 Comment: 143/83  Pulse 82   Temp (!) 97.1 F (36.2 C)   Resp 16   Ht 5\' 2"  (1.575 m)   Wt 142 lb 3.2 oz (64.5 kg)   SpO2 99%   BMI 26.01 kg/m    Physical Exam Constitutional:      Appearance: Normal appearance.  HENT:     Head: Normocephalic and atraumatic.     Nose: Nose normal.     Mouth/Throat:     Mouth: Mucous membranes are moist.     Pharynx: No posterior oropharyngeal erythema.  Eyes:     Extraocular Movements: Extraocular movements intact.     Pupils: Pupils are equal, round, and reactive to light.  Cardiovascular:     Rate and Rhythm: Normal rate and regular rhythm.     Pulses: Normal pulses.     Heart sounds: Normal heart sounds.  Pulmonary:      Effort: Pulmonary effort is normal.     Breath sounds: Normal breath sounds.  Skin:    General: Skin is warm and dry.  Neurological:     General: No focal deficit present.     Mental Status: She is alert.  Psychiatric:        Mood and Affect: Mood normal.        Behavior: Behavior normal.        Assessment/Plan: 1. Essential hypertension Borderline in office and at home.  We will continue on 5 mg of amlodipine however patient instructed that she may take an extra half tablet if blood pressure is consistently rising and should notify office - amLODipine (NORVASC) 5 MG tablet; Take 1 tablet (5 mg total) by mouth daily.  Dispense: 90 tablet; Refill: 1  2. Acquired hypothyroidism We will recheck labs and adjust Synthroid accordingly - levothyroxine (SYNTHROID) 88 MCG tablet; Take 1 tablet (88 mcg total) by mouth daily before breakfast.  Dispense: 90 tablet; Refill: 0 - TSH + free T4  3. Aortic atherosclerosis (HCC) Continue Crestor - rosuvastatin (CRESTOR) 5 MG tablet; Take one tab 2 x a week for atherosclerosis  Dispense: 24 tablet; Refill: 3  4. Vasomotor symptoms due to menopause - estradiol (ESTRACE) 0.5 MG tablet; Take 1 tablet (0.5 mg total) by mouth daily.  Dispense: 90 tablet; Refill: 1  5. Chronic pain of both shoulders Advised to avoid overuse of NSAIDs and patient may consider seeing Ortho for possible shoulder injections  6. Other insomnia May continue half tab of Xanax as needed - ALPRAZolam (XANAX) 0.25 MG tablet; Take one tab po qhs prn for anxiety  Dispense: 30 tablet; Refill: 2  7. Mixed hyperlipidemia May continue Crestor and will update labs - Lipid Panel With LDL/HDL Ratio  8. Vitamin D deficiency - VITAMIN D 25 Hydroxy (Vit-D Deficiency, Fractures)  9. B12 deficiency - B12 and Folate Panel  10. Other fatigue - CBC w/Diff/Platelet - Comprehensive metabolic panel - TSH + free T4 - Lipid Panel With LDL/HDL Ratio - B12 and Folate Panel -  Fe+TIBC+Fer - VITAMIN D 25 Hydroxy (Vit-D Deficiency, Fractures)  11. Needs flu shot - Flu Vaccine MDCK QUAD PF   General Counseling: mary-anne polizzi understanding of the findings of todays visit and agrees with plan of treatment. I have discussed any further diagnostic evaluation that may be needed or ordered today. We also reviewed her medications today. she has been encouraged to call the office with any questions or concerns that should arise related to todays visit.    Orders Placed This Encounter  Procedures   Flu Vaccine MDCK QUAD PF   CBC w/Diff/Platelet   Comprehensive metabolic panel   TSH + free T4   Lipid Panel With LDL/HDL Ratio   B12 and Folate Panel   Fe+TIBC+Fer   VITAMIN D 25 Hydroxy (Vit-D Deficiency, Fractures)    Meds ordered this encounter  Medications   Zoster Vaccine Adjuvanted [facility]) injection    Sig: Inject 0.5 mLs into the muscle once for 1 dose.    Dispense:  0.5 mL    Refill:  0   pneumococcal 20-valent conjugate vaccine (PREVNAR 20) 0.5 ML injection    Sig: Inject 0.5 mLs into the muscle tomorrow at 10 am for 1 dose.    Dispense:  0.5 mL    Refill:  0   ALPRAZolam (XANAX) 0.25 MG tablet    Sig: Take one tab po qhs prn for anxiety    Dispense:  30 tablet    Refill:  2   amLODipine (NORVASC) 5 MG tablet    Sig: Take 1 tablet (5 mg total) by mouth daily.    Dispense:  90 tablet    Refill:  1   levothyroxine (SYNTHROID) 88 MCG tablet    Sig: Take 1 tablet (88 mcg total) by mouth daily before breakfast.    Dispense:  90 tablet    Refill:  0   rosuvastatin (CRESTOR) 5 MG tablet    Sig: Take one tab 2 x a week for atherosclerosis    Dispense:  24 tablet    Refill:  3   estradiol (ESTRACE) 0.5 MG tablet    Sig: Take 1 tablet (0.5 mg total) by mouth daily.    Dispense:  90 tablet    Refill:  1    This patient was seen by Lynn Ito, PA-C in collaboration with Dr. Beverely Risen as a part of collaborative care agreement.   Total  time spent:30 Minutes Time spent includes review of chart, medications, test results, and follow up plan with the patient.      Dr [name] Internal medicine

## 2022-04-03 ENCOUNTER — Encounter: Payer: Self-pay | Admitting: Physician Assistant

## 2022-04-03 ENCOUNTER — Telehealth (INDEPENDENT_AMBULATORY_CARE_PROVIDER_SITE_OTHER): Payer: PPO | Admitting: Physician Assistant

## 2022-04-03 VITALS — BP 127/74 | Resp 16 | Ht 63.0 in

## 2022-04-03 DIAGNOSIS — J0100 Acute maxillary sinusitis, unspecified: Secondary | ICD-10-CM

## 2022-04-03 DIAGNOSIS — R051 Acute cough: Secondary | ICD-10-CM | POA: Diagnosis not present

## 2022-04-03 MED ORDER — HYDROCOD POLI-CHLORPHE POLI ER 10-8 MG/5ML PO SUER: 5.0000 mL | Freq: Two times a day (BID) | ORAL | 0 refills | Status: DC | PRN

## 2022-04-03 MED ORDER — AMOXICILLIN-POT CLAVULANATE 875-125 MG PO TABS: 1.0000 | ORAL_TABLET | Freq: Two times a day (BID) | ORAL | 0 refills | Status: DC

## 2022-04-03 NOTE — Progress Notes (Signed)
[facility] [address] [place], [place] [address]  Internal MEDICINE  Telephone Visit  Patient Name: Michelle [name]  [id number]  [id number]  Date of Service: 04/03/2022  I connected with the patient at 2:59 by telephone and verified the patients identity using two identifiers.   I discussed the limitations, risks, security and privacy concerns of performing an evaluation and management service by telephone and the availability of in person appointments. I also discussed with the patient that there may be a patient responsible charge related to the service.  The patient expressed understanding and agrees to proceed.    Chief Complaint  Patient presents with   Telephone Screen    [phone], telephone call   Telephone Assessment    Negative for Covid   Cough   Sore Throat   Otalgia    HPI Pt is here for virtual sick visit -Started Thursday with postnasal drip and then cough started. Pressure on ears. Sore throat. Starting to lose voice as well. -Negative for covid -No SOB or wheezing -Some chills -Taking zycam and nasal spray  Current Medication: Outpatient Encounter Medications as of 04/03/2022  Medication Sig   ALPRAZolam (XANAX) 0.25 MG tablet Take one tab po qhs prn for anxiety   amLODipine (NORVASC) 5 MG tablet Take 1 tablet (5 mg total) by mouth daily.   amoxicillin-clavulanate (AUGMENTIN) 875-125 MG tablet Take 1 tablet by mouth 2 (two) times daily. Take with food.   aspirin EC 81 MG tablet Take 81 mg by mouth daily.   chlorpheniramine-HYDROcodone (TUSSIONEX) 10-8 MG/5ML Take 5 mLs by mouth every 12 (twelve) hours as needed for cough.   diclofenac Sodium (VOLTAREN) 1 % GEL Apply 2 g topically 4 (four) times daily.   estradiol (ESTRACE) 0.1 MG/GM vaginal cream Estrogen Cream Instruction Discard applicator Apply pea sized amount to tip of finger to urethra before bed. Wash hands well after application. Use Monday, Wednesday and Friday   estradiol  (ESTRACE) 0.5 MG tablet Take 1 tablet (0.5 mg total) by mouth daily.   ibuprofen (ADVIL) 800 MG tablet Take 1 tablet (800 mg total) by mouth daily as needed.   levothyroxine (SYNTHROID) 88 MCG tablet Take 1 tablet (88 mcg total) by mouth daily before breakfast.   nitrofurantoin (MACRODANTIN) 50 MG capsule Take 1 capsule (50 mg total) by mouth daily.   rosuvastatin (CRESTOR) 5 MG tablet Take one tab 2 x a week for atherosclerosis   No facility-administered encounter medications on file as of 04/03/2022.    Surgical History: Past Surgical History:  Procedure Laterality Date   child birth natural     diatation      Medical History: Past Medical History:  Diagnosis Date   Bladder incontinence    Hypertension    Hypothyroidism     Family History: Family History  Problem Relation Age of Onset   Cancer Father    Hyperlipidemia Father    Hypertension Father    Breast cancer Neg Hx     Social History   Socioeconomic History   Marital status: Married    Spouse name: Not on file   Number of children: Not on file   Years of education: Not on file   Highest education level: Not on file  Occupational History   Not on file  Tobacco Use   Smoking status: Former    Types: Cigarettes   Smokeless tobacco: Never  Substance and Sexual Activity   Alcohol use: No   Drug use: No  Sexual activity: Not Currently    Birth control/protection: Surgical, Post-menopausal    Comment: Hysterectomy  Other Topics Concern   Not on file  Social History Narrative   Not on file   Social Determinants of Health   Financial Resource Strain: Not on file  Food Insecurity: Not on file  Transportation Needs: Not on file  Physical Activity: Not on file  Stress: Not on file  Social Connections: Not on file  Intimate Partner Violence: Not on file      Review of Systems  Constitutional:  Positive for chills. Negative for fatigue and fever.  HENT:  Positive for congestion, ear pain, postnasal  drip and sore throat. Negative for mouth sores.   Respiratory:  Positive for cough. Negative for shortness of breath and wheezing.   Cardiovascular:  Negative for chest pain.  Genitourinary:  Negative for flank pain.  Psychiatric/Behavioral: Negative.      Vital Signs: BP 127/74   Resp 16   Ht 5\' 3"  (1.6 m)   BMI 25.19 kg/m    Observation/Objective:  Pt is able to carry out conversation, though voice is strained and coughing some throughout.   Assessment/Plan: 1. Acute non-recurrent maxillary sinusitis Will start augmentin BID with food and may use tussionex as needed. Cautioned on use and side effects making her more drowsy. Continue nasal spray and stay well hydrated. - amoxicillin-clavulanate (AUGMENTIN) 875-125 MG tablet; Take 1 tablet by mouth 2 (two) times daily. Take with food.  Dispense: 20 tablet; Refill: 0 - chlorpheniramine-HYDROcodone (TUSSIONEX) 10-8 MG/5ML; Take 5 mLs by mouth every 12 (twelve) hours as needed for cough.  Dispense: 70 mL; Refill: 0  2. Acute cough - chlorpheniramine-HYDROcodone (TUSSIONEX) 10-8 MG/5ML; Take 5 mLs by mouth every 12 (twelve) hours as needed for cough.  Dispense: 70 mL; Refill: 0   General Counseling: Michelle [name] understanding of the findings of today's phone visit and agrees with plan of treatment. I have discussed any further diagnostic evaluation that may be needed or ordered today. We also reviewed her medications today. she has been encouraged to call the office with any questions or concerns that should arise related to todays visit.    No orders of the defined types were placed in this encounter.   Meds ordered this encounter  Medications   amoxicillin-clavulanate (AUGMENTIN) 875-125 MG tablet    Sig: Take 1 tablet by mouth 2 (two) times daily. Take with food.    Dispense:  20 tablet    Refill:  0   chlorpheniramine-HYDROcodone (TUSSIONEX) 10-8 MG/5ML    Sig: Take 5 mLs by mouth every 12 (twelve) hours as needed  for cough.    Dispense:  70 mL    Refill:  0    Time spent:30 Minutes    Dr Alvia Grove Internal medicine

## 2022-05-02 IMAGING — NM NM RENAL IMAGING FLOW W/ PHARM
3 series · 18 of 18 positions shown · non-contrast
Comparison: CT 10/24/2019

CLINICAL DATA: Evaluate hydronephrosis of the right kidney.

EXAM:
NUCLEAR MEDICINE RENAL SCAN WITH DIURETIC ADMINISTRATION
TECHNIQUE: Radionuclide angiographic and sequential renal images were obtained
after intravenous injection of radiopharmaceutical. Imaging was
continued during slow intravenous injection of Lasix approximately
15 minutes after the start of the examination.
RADIOPHARMACEUTICALS:  5.0 mCi Dechnetium-CCm MAG3 IV

[Series 1000: lasix renal mag 3 (results) · 7.79mm/px · 6 of 114 frames shown]
[frame 10/114]
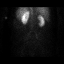
[frame 29/114]
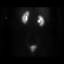
[frame 48/114]
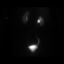
[frame 67/114]
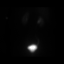
[frame 86/114]
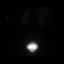
[frame 105/114]
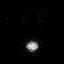

[Series 1000: lasix renal mag 3 (first dynamic renal results) · 7.79mm/px · 6 of 114 frames shown]
[frame 10/114]
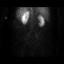
[frame 29/114]
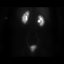
[frame 48/114]
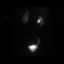
[frame 67/114]
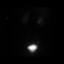
[frame 86/114]
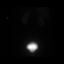
[frame 105/114]
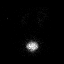

[Series 1000: lasix renal mag 3 · 7.79mm/px · 6 of 114 frames shown]
[frame 10/114]
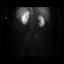
[frame 29/114]
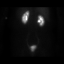
[frame 48/114]
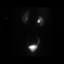
[frame 67/114]
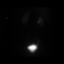
[frame 86/114]
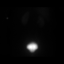
[frame 105/114]
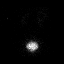

[18 of 18 positions shown; findings below may reference images not displayed]

FINDINGS: Flow:  Prompt symmetric arterial flow to the kidneys.

Left renogram: Left kidney appears normal in size and location.
There is normal cortical uptake, excretion and clearance of the
radiopharmaceutical.

Right renogram: The right kidney is in normal location appearing
slightly smaller than the left kidney. There is symmetric cortical
uptake and excretion of the radiopharmaceutical by the right kidney.
When compared with the left there is asymmetric, delayed clearance
of the radiopharmaceutical from the dilated right renal pelvis and
right ureter.

Differential:

Left kidney = 61.1 %

Right kidney = 38.9 %

Post Lasix :

Left kidney: at time of Lasix administration near complete clearance
of the radiopharmaceutical by the normal left kidney had been
achieved.

Right kidney: Following Lasix administration there is clearance of
the radiopharmaceutical from the right renal collecting system and
ureter.
IMPRESSION: 1. No findings of high-grade obstructive uropathy bilaterally.
2. Delayed clearance of the radiopharmaceutical from dilated right
renal collecting system which washes out following Lasix
administration.
3. Split renal function is equal to 61.1 % from the left kidney and
38.9% from the right kidney.

## 2022-05-05 ENCOUNTER — Ambulatory Visit: Payer: PPO | Admitting: Physician Assistant

## 2022-05-18 ENCOUNTER — Other Ambulatory Visit: Payer: Self-pay

## 2022-05-18 ENCOUNTER — Telehealth: Payer: Self-pay

## 2022-05-18 DIAGNOSIS — N390 Urinary tract infection, site not specified: Secondary | ICD-10-CM

## 2022-05-18 MED ORDER — NITROFURANTOIN MONOHYD MACRO 100 MG PO CAPS: 100.0000 mg | ORAL_CAPSULE | Freq: Two times a day (BID) | ORAL | 0 refills | Status: DC

## 2022-05-18 NOTE — Telephone Encounter (Signed)
Pt called that she is having UTI symptoms and back pain as per dr send macrobid to [facility]

## 2022-05-23 DIAGNOSIS — E782 Mixed hyperlipidemia: Secondary | ICD-10-CM | POA: Diagnosis not present

## 2022-05-23 DIAGNOSIS — E559 Vitamin D deficiency, unspecified: Secondary | ICD-10-CM | POA: Diagnosis not present

## 2022-05-23 DIAGNOSIS — E039 Hypothyroidism, unspecified: Secondary | ICD-10-CM | POA: Diagnosis not present

## 2022-05-23 DIAGNOSIS — E538 Deficiency of other specified B group vitamins: Secondary | ICD-10-CM | POA: Diagnosis not present

## 2022-05-23 DIAGNOSIS — R5383 Other fatigue: Secondary | ICD-10-CM | POA: Diagnosis not present

## 2022-05-24 LAB — CBC WITH DIFFERENTIAL/PLATELET
Basophils Absolute: 0.1 10*3/uL (ref 0.0–0.2)
Basos: 1 %
EOS (ABSOLUTE): 0.1 10*3/uL (ref 0.0–0.4)
Eos: 2 %
Hematocrit: 46.6 % (ref 34.0–46.6)
Hemoglobin: 15.6 g/dL (ref 11.1–15.9)
Immature Grans (Abs): 0 10*3/uL (ref 0.0–0.1)
Immature Granulocytes: 0 %
Lymphocytes Absolute: 1.7 10*3/uL (ref 0.7–3.1)
Lymphs: 26 %
MCH: 29.1 pg (ref 26.6–33.0)
MCHC: 33.5 g/dL (ref 31.5–35.7)
MCV: 87 fL (ref 79–97)
Monocytes Absolute: 0.4 10*3/uL (ref 0.1–0.9)
Monocytes: 7 %
Neutrophils Absolute: 4.2 10*3/uL (ref 1.4–7.0)
Neutrophils: 64 %
Platelets: 215 10*3/uL (ref 150–450)
RBC: 5.37 x10E6/uL — ABNORMAL HIGH (ref 3.77–5.28)
RDW: 13.9 % (ref 11.7–15.4)
WBC: 6.5 10*3/uL (ref 3.4–10.8)

## 2022-05-24 LAB — IRON,TIBC AND FERRITIN PANEL
Ferritin: 112 ng/mL (ref 15–150)
Iron Saturation: 23 % (ref 15–55)
Iron: 69 ug/dL (ref 27–139)
Total Iron Binding Capacity: 301 ug/dL (ref 250–450)
UIBC: 232 ug/dL (ref 118–369)

## 2022-05-24 LAB — COMPREHENSIVE METABOLIC PANEL
ALT: 18 IU/L (ref 0–32)
AST: 19 IU/L (ref 0–40)
Albumin/Globulin Ratio: 1.5 (ref 1.2–2.2)
Albumin: 4.7 g/dL (ref 3.8–4.8)
Alkaline Phosphatase: 116 IU/L (ref 44–121)
BUN/Creatinine Ratio: 17 (ref 12–28)
BUN: 14 mg/dL (ref 8–27)
Bilirubin Total: 0.4 mg/dL (ref 0.0–1.2)
CO2: 24 mmol/L (ref 20–29)
Calcium: 10.4 mg/dL — ABNORMAL HIGH (ref 8.7–10.3)
Chloride: 103 mmol/L (ref 96–106)
Creatinine, Ser: 0.83 mg/dL (ref 0.57–1.00)
Globulin, Total: 3.1 g/dL (ref 1.5–4.5)
Glucose: 100 mg/dL — ABNORMAL HIGH (ref 70–99)
Potassium: 4.1 mmol/L (ref 3.5–5.2)
Sodium: 143 mmol/L (ref 134–144)
Total Protein: 7.8 g/dL (ref 6.0–8.5)
eGFR: 74 mL/min/{1.73_m2} (ref >59)

## 2022-05-24 LAB — TSH+FREE T4
Free T4: 1.24 ng/dL (ref 0.82–1.77)
TSH: 3.03 u[IU]/mL (ref 0.450–4.500)

## 2022-05-24 LAB — LIPID PANEL WITH LDL/HDL RATIO
Cholesterol, Total: 163 mg/dL (ref 100–199)
HDL: 46 mg/dL (ref >39)
LDL Chol Calc (NIH): 91 mg/dL (ref 0–99)
LDL/HDL Ratio: 2 ratio (ref 0.0–3.2)
Triglycerides: 151 mg/dL — ABNORMAL HIGH (ref 0–149)
VLDL Cholesterol Cal: 26 mg/dL (ref 5–40)

## 2022-05-24 LAB — B12 AND FOLATE PANEL
Folate: 9.6 ng/mL (ref >3.0)
Vitamin B-12: 349 pg/mL (ref 232–1245)

## 2022-05-24 LAB — VITAMIN D 25 HYDROXY (VIT D DEFICIENCY, FRACTURES): Vit D, 25-Hydroxy: 37.9 ng/mL (ref 30.0–100.0)

## 2022-05-27 ENCOUNTER — Other Ambulatory Visit: Payer: Self-pay | Admitting: Physician Assistant

## 2022-05-27 DIAGNOSIS — E039 Hypothyroidism, unspecified: Secondary | ICD-10-CM

## 2022-05-29 ENCOUNTER — Encounter: Payer: Self-pay | Admitting: Physician Assistant

## 2022-05-29 ENCOUNTER — Ambulatory Visit (INDEPENDENT_AMBULATORY_CARE_PROVIDER_SITE_OTHER): Payer: PPO | Admitting: Physician Assistant

## 2022-05-29 VITALS — BP 120/80 | HR 78 | Temp 97.8°F | Resp 16 | Ht 62.0 in | Wt 142.0 lb

## 2022-05-29 DIAGNOSIS — G4709 Other insomnia: Secondary | ICD-10-CM | POA: Diagnosis not present

## 2022-05-29 DIAGNOSIS — R300 Dysuria: Secondary | ICD-10-CM | POA: Diagnosis not present

## 2022-05-29 DIAGNOSIS — Z1231 Encounter for screening mammogram for malignant neoplasm of breast: Secondary | ICD-10-CM | POA: Diagnosis not present

## 2022-05-29 DIAGNOSIS — I10 Essential (primary) hypertension: Secondary | ICD-10-CM

## 2022-05-29 DIAGNOSIS — Z0001 Encounter for general adult medical examination with abnormal findings: Secondary | ICD-10-CM | POA: Diagnosis not present

## 2022-05-29 DIAGNOSIS — I700 Atherosclerosis of aorta: Secondary | ICD-10-CM

## 2022-05-29 MED ORDER — ALPRAZOLAM 0.25 MG PO TABS: ORAL_TABLET | ORAL | 1 refills | Status: DC

## 2022-05-29 MED ORDER — AMLODIPINE BESYLATE 5 MG PO TABS: 5.0000 mg | ORAL_TABLET | Freq: Every day | ORAL | 1 refills | Status: DC

## 2022-05-29 MED ORDER — ROSUVASTATIN CALCIUM 5 MG PO TABS: ORAL_TABLET | ORAL | 3 refills | Status: DC

## 2022-05-29 NOTE — Progress Notes (Signed)
[facility] [address] [place], [place] [address]  Internal MEDICINE  Office Visit Note  Patient Name: Michelle [name]  [id number]  [id number]  Date of Service: 06/07/2022  Chief Complaint  Patient presents with   Medicare Wellness   Hypertension   Hypothyroidism     HPI Pt is here for routine health maintenance examination and is doing well -Labs reviewed: glucose borderline, calcium slightly elevated--she is taking a liquid bone health supplement and thinks it has calcium and will reduce. -B12 low normal, may start supplement -Cholesterol improved, continue crestor -Due for second shingles--had first round, PNA done near end of Dec, will update dates at next shingles vaccine -1/2 tab xanax at night, some nights can go without -will try tapering estradiol  Current Medication: Outpatient Encounter Medications as of 05/29/2022  Medication Sig   amoxicillin-clavulanate (AUGMENTIN) 875-125 MG tablet Take 1 tablet by mouth 2 (two) times daily. Take with food.   aspirin EC 81 MG tablet Take 81 mg by mouth daily.   chlorpheniramine-HYDROcodone (TUSSIONEX) 10-8 MG/5ML Take 5 mLs by mouth every 12 (twelve) hours as needed for cough.   diclofenac Sodium (VOLTAREN) 1 % GEL Apply 2 g topically 4 (four) times daily.   estradiol (ESTRACE) 0.1 MG/GM vaginal cream Estrogen Cream Instruction Discard applicator Apply pea sized amount to tip of finger to urethra before bed. Wash hands well after application. Use Monday, Wednesday and Friday   estradiol (ESTRACE) 0.5 MG tablet Take 1 tablet (0.5 mg total) by mouth daily.   ibuprofen (ADVIL) 800 MG tablet Take 1 tablet (800 mg total) by mouth daily as needed.   levothyroxine (SYNTHROID) 88 MCG tablet TAKE 1 TABLET BY MOUTH ONCE DAILY BEFORE BREAKFAST   nitrofurantoin (MACRODANTIN) 50 MG capsule Take 1 capsule (50 mg total) by mouth daily.   nitrofurantoin, macrocrystal-monohydrate, (MACROBID) 100 MG capsule Take 1 capsule (100 mg  total) by mouth 2 (two) times daily.   [DISCONTINUED] ALPRAZolam (XANAX) 0.25 MG tablet Take one tab po qhs prn for anxiety   [DISCONTINUED] amLODipine (NORVASC) 5 MG tablet Take 1 tablet (5 mg total) by mouth daily.   [DISCONTINUED] rosuvastatin (CRESTOR) 5 MG tablet Take one tab 2 x a week for atherosclerosis   ALPRAZolam (XANAX) 0.25 MG tablet Take one tab po qhs prn for anxiety   amLODipine (NORVASC) 5 MG tablet Take 1 tablet (5 mg total) by mouth daily.   rosuvastatin (CRESTOR) 5 MG tablet Take one tab 2 x a week for atherosclerosis   No facility-administered encounter medications on file as of 05/29/2022.    Surgical History: Past Surgical History:  Procedure Laterality Date   child birth natural     diatation      Medical History: Past Medical History:  Diagnosis Date   Bladder incontinence    Hypertension    Hypothyroidism     Family History: Family History  Problem Relation Age of Onset   Cancer Father    Hyperlipidemia Father    Hypertension Father    Breast cancer Neg Hx       Review of Systems  Constitutional:  Negative for chills, fever and unexpected weight change.  HENT:  Negative for congestion, rhinorrhea, sneezing and sore throat.   Eyes:  Negative for redness.  Respiratory:  Negative for cough, chest tightness and shortness of breath.   Cardiovascular:  Negative for chest pain and palpitations.  Gastrointestinal:  Negative for abdominal pain, constipation, diarrhea, nausea and vomiting.  Genitourinary:  Negative for dysuria  and frequency.  Musculoskeletal:  Negative for arthralgias, back pain, joint swelling and neck pain.  Skin:  Negative for rash.  Neurological: Negative.  Negative for tremors and numbness.  Hematological:  Negative for adenopathy. Does not bruise/bleed easily.  Psychiatric/Behavioral:  Positive for sleep disturbance. Negative for behavioral problems (Depression), self-injury and suicidal ideas. The patient is not nervous/anxious.       Vital Signs: BP 120/80   Pulse 78   Temp 97.8 F (36.6 C)   Resp 16   Ht 5\' 2"  (1.575 m)   Wt 142 lb (64.4 kg)   SpO2 96%   BMI 25.97 kg/m    Physical Exam Constitutional:      Appearance: Normal appearance.  HENT:     Head: Normocephalic and atraumatic.     Nose: Nose normal.     Mouth/Throat:     Mouth: Mucous membranes are moist.     Pharynx: No posterior oropharyngeal erythema.  Eyes:     Extraocular Movements: Extraocular movements intact.     Pupils: Pupils are equal, round, and reactive to light.  Cardiovascular:     Rate and Rhythm: Normal rate and regular rhythm.     Pulses: Normal pulses.     Heart sounds: Normal heart sounds.  Pulmonary:     Effort: Pulmonary effort is normal.     Breath sounds: Normal breath sounds.  Chest:     Chest wall: No tenderness.  Breasts:    Right: Normal. No mass.     Left: Normal. No mass.  Abdominal:     General: Abdomen is flat.     Tenderness: There is no abdominal tenderness.  Skin:    General: Skin is warm and dry.  Neurological:     General: No focal deficit present.     Mental Status: She is alert.  Psychiatric:        Mood and Affect: Mood normal.        Behavior: Behavior normal.      LABS: Recent Results (from the past 2160 hour(s))  CBC w/Diff/Platelet     Status: Abnormal   Collection Time: 05/23/22  9:16 AM  Result Value Ref Range   WBC 6.5 3.4 - 10.8 x10E3/uL   RBC 5.37 (H) 3.77 - 5.28 x10E6/uL   Hemoglobin 15.6 11.1 - 15.9 g/dL   Hematocrit 46.6 34.0 - 46.6 %   MCV 87 79 - 97 fL   MCH 29.1 26.6 - 33.0 pg   MCHC 33.5 31.5 - 35.7 g/dL   RDW 13.9 11.7 - 15.4 %   Platelets 215 150 - 450 x10E3/uL   Neutrophils 64 Not Estab. %   Lymphs 26 Not Estab. %   Monocytes 7 Not Estab. %   Eos 2 Not Estab. %   Basos 1 Not Estab. %   Neutrophils Absolute 4.2 1.4 - 7.0 x10E3/uL   Lymphocytes Absolute 1.7 0.7 - 3.1 x10E3/uL   Monocytes Absolute 0.4 0.1 - 0.9 x10E3/uL   EOS (ABSOLUTE) 0.1 0.0 - 0.4  x10E3/uL   Basophils Absolute 0.1 0.0 - 0.2 x10E3/uL   Immature Granulocytes 0 Not Estab. %   Immature Grans (Abs) 0.0 0.0 - 0.1 x10E3/uL  Comprehensive metabolic panel     Status: Abnormal   Collection Time: 05/23/22  9:16 AM  Result Value Ref Range   Glucose 100 (H) 70 - 99 mg/dL   BUN 14 8 - 27 mg/dL   Creatinine, Ser 0.83 0.57 - 1.00 mg/dL   eGFR 74 >59  mL/min/1.73   BUN/Creatinine Ratio 17 12 - 28   Sodium 143 134 - 144 mmol/L   Potassium 4.1 3.5 - 5.2 mmol/L   Chloride 103 96 - 106 mmol/L   CO2 24 20 - 29 mmol/L   Calcium 10.4 (H) 8.7 - 10.3 mg/dL   Total Protein 7.8 6.0 - 8.5 g/dL   Albumin 4.7 3.8 - 4.8 g/dL   Globulin, Total 3.1 1.5 - 4.5 g/dL   Albumin/Globulin Ratio 1.5 1.2 - 2.2   Bilirubin Total 0.4 0.0 - 1.2 mg/dL   Alkaline Phosphatase 116 44 - 121 IU/L   AST 19 0 - 40 IU/L   ALT 18 0 - 32 IU/L  TSH + free T4     Status: None   Collection Time: 05/23/22  9:16 AM  Result Value Ref Range   TSH 3.030 0.450 - 4.500 uIU/mL   Free T4 1.24 0.82 - 1.77 ng/dL  Lipid Panel With LDL/HDL Ratio     Status: Abnormal   Collection Time: 05/23/22  9:16 AM  Result Value Ref Range   Cholesterol, Total 163 100 - 199 mg/dL   Triglycerides 283 (H) 0 - 149 mg/dL   HDL 46 >15 mg/dL   VLDL Cholesterol Cal 26 5 - 40 mg/dL   LDL Chol Calc (NIH) 91 0 - 99 mg/dL   LDL/HDL Ratio 2.0 0.0 - 3.2 ratio    Comment:                                     LDL/HDL Ratio                                             Men  Women                               1/2 Avg.Risk  1.0    1.5                                   Avg.Risk  3.6    3.2                                2X Avg.Risk  6.2    5.0                                3X Avg.Risk  8.0    6.1   B12 and Folate Panel     Status: None   Collection Time: 05/23/22  9:16 AM  Result Value Ref Range   Vitamin B-12 349 232 - 1,245 pg/mL   Folate 9.6 >3.0 ng/mL    Comment: A serum folate concentration of less than 3.1 ng/mL is considered to represent  clinical deficiency.   Fe+TIBC+Fer     Status: None   Collection Time: 05/23/22  9:16 AM  Result Value Ref Range   Total Iron Binding Capacity 301 250 - 450 ug/dL   UIBC 176 160 - 737 ug/dL   Iron 69 27 - 106 ug/dL   Iron Saturation 23 15 -  55 %   Ferritin 112 15 - 150 ng/mL  VITAMIN D 25 Hydroxy (Vit-D Deficiency, Fractures)     Status: None   Collection Time: 05/23/22  9:16 AM  Result Value Ref Range   Vit D, 25-Hydroxy 37.9 30.0 - 100.0 ng/mL    Comment: Vitamin D deficiency has been defined by the Institute of Medicine and an Endocrine Society practice guideline as a level of serum 25-OH vitamin D less than 20 ng/mL (1,2). The Endocrine Society went on to further define vitamin D insufficiency as a level between 21 and 29 ng/mL (2). 1. IOM (Institute of Medicine). 2010. Dietary reference    intakes for calcium and D. [place]: The    [employer]. 2. Holick MF, Binkley [place], Bischoff-Ferrari HA, et al.    Evaluation, treatment, and prevention of vitamin D    deficiency: an Endocrine Society clinical practice    guideline. JCEM. 2011 Jul; 96(7):1911-30.   UA/M w/rflx Culture, Routine     Status: None   Collection Time: 05/29/22  3:40 PM   Specimen: Urine   Urine  Result Value Ref Range   Specific Gravity, UA 1.005 1.005 - 1.030   pH, UA 6.0 5.0 - 7.5   Color, UA Yellow Yellow   Appearance Ur Clear Clear   Leukocytes,UA Negative Negative   Protein,UA Negative Negative/Trace   Glucose, UA Negative Negative   Ketones, UA Negative Negative   RBC, UA Negative Negative   Bilirubin, UA Negative Negative   Urobilinogen, Ur 0.2 0.2 - 1.0 mg/dL   Nitrite, UA Negative Negative   Microscopic Examination Comment     Comment: Microscopic follows if indicated.   Microscopic Examination See below:     Comment: Microscopic was indicated and was performed.   Urinalysis Reflex Comment     Comment: This specimen will not reflex to a Urine Culture.  Microscopic  Examination     Status: None   Collection Time: 05/29/22  3:40 PM   Urine  Result Value Ref Range   WBC, UA None seen 0 - 5 /hpf   RBC, Urine None seen 0 - 2 /hpf   Epithelial Cells (non renal) 0-10 0 - 10 /hpf   Casts None seen None seen /lpf   Bacteria, UA None seen None seen/Few        Assessment/Plan: 1. Encounter for general adult medical examination with abnormal findings Cpe performed, labs reviewed, pt will update office with vaccine dates  2. Essential hypertension Stable, continue current medication - amLODipine (NORVASC) 5 MG tablet; Take 1 tablet (5 mg total) by mouth daily.  Dispense: 90 tablet; Refill: 1  3. Aortic atherosclerosis (HCC) - rosuvastatin (CRESTOR) 5 MG tablet; Take one tab 2 x a week for atherosclerosis  Dispense: 24 tablet; Refill: 3  4. Other insomnia May continue xanax as needed as before - ALPRAZolam (XANAX) 0.25 MG tablet; Take one tab po qhs prn for anxiety  Dispense: 45 tablet; Refill: 1  5. Visit for screening mammogram - MM 3D SCREEN BREAST BILATERAL; Future  6. Dysuria - UA/M w/rflx Culture, Routine   General Counseling: carisa backhaus understanding of the findings of todays visit and agrees with plan of treatment. I have discussed any further diagnostic evaluation that may be needed or ordered today. We also reviewed her medications today. she has been encouraged to call the office with any questions or concerns that should arise related to todays visit.    Counseling:    Orders Placed This  Encounter  Procedures   Microscopic Examination   MM 3D SCREEN BREAST BILATERAL   UA/M w/rflx Culture, Routine    Meds ordered this encounter  Medications   ALPRAZolam (XANAX) 0.25 MG tablet    Sig: Take one tab po qhs prn for anxiety    Dispense:  45 tablet    Refill:  1   amLODipine (NORVASC) 5 MG tablet    Sig: Take 1 tablet (5 mg total) by mouth daily.    Dispense:  90 tablet    Refill:  1   rosuvastatin (CRESTOR) 5 MG  tablet    Sig: Take one tab 2 x a week for atherosclerosis    Dispense:  24 tablet    Refill:  3    This patient was seen by Lynn Ito, PA-C in collaboration with Dr. Beverely Risen as a part of collaborative care agreement.  Total time spent:35 Minutes  Time spent includes review of chart, medications, test results, and follow up plan with the patient.     [name], MD  Internal Medicine

## 2022-05-30 LAB — UA/M W/RFLX CULTURE, ROUTINE
Bilirubin, UA: NEGATIVE
Glucose, UA: NEGATIVE
Ketones, UA: NEGATIVE
Leukocytes,UA: NEGATIVE
Nitrite, UA: NEGATIVE
Protein,UA: NEGATIVE
RBC, UA: NEGATIVE
Specific Gravity, UA: 1.005 (ref 1.005–1.030)
Urobilinogen, Ur: 0.2 mg/dL (ref 0.2–1.0)
pH, UA: 6 (ref 5.0–7.5)

## 2022-05-30 LAB — MICROSCOPIC EXAMINATION
Bacteria, UA: NONE SEEN
Casts: NONE SEEN /lpf
RBC, Urine: NONE SEEN /hpf (ref 0–2)
WBC, UA: NONE SEEN /hpf (ref 0–5)

## 2022-06-26 ENCOUNTER — Other Ambulatory Visit: Payer: Self-pay

## 2022-06-26 ENCOUNTER — Ambulatory Visit: Payer: Self-pay | Admitting: Nurse Practitioner

## 2022-06-26 ENCOUNTER — Encounter: Payer: Self-pay | Admitting: Urology

## 2022-06-26 ENCOUNTER — Other Ambulatory Visit
Admission: RE | Admit: 2022-06-26 | Discharge: 2022-06-26 | Disposition: A | Discharge status: Home / Self Care | Payer: PPO | Attending: Urology | Admitting: Urology

## 2022-06-26 ENCOUNTER — Ambulatory Visit (INDEPENDENT_AMBULATORY_CARE_PROVIDER_SITE_OTHER): Payer: PPO | Admitting: Urology

## 2022-06-26 VITALS — BP 148/87 | HR 87 | Temp 97.7°F | Ht 62.0 in | Wt 142.0 lb

## 2022-06-26 DIAGNOSIS — N8110 Cystocele, unspecified: Secondary | ICD-10-CM | POA: Diagnosis not present

## 2022-06-26 DIAGNOSIS — N390 Urinary tract infection, site not specified: Secondary | ICD-10-CM

## 2022-06-26 DIAGNOSIS — N3946 Mixed incontinence: Secondary | ICD-10-CM

## 2022-06-26 DIAGNOSIS — R32 Unspecified urinary incontinence: Secondary | ICD-10-CM | POA: Diagnosis not present

## 2022-06-26 DIAGNOSIS — N1339 Other hydronephrosis: Secondary | ICD-10-CM

## 2022-06-26 LAB — URINALYSIS, COMPLETE (UACMP) WITH MICROSCOPIC
Bilirubin Urine: NEGATIVE
Glucose, UA: NEGATIVE mg/dL
Ketones, ur: NEGATIVE mg/dL
Nitrite: NEGATIVE
Protein, ur: NEGATIVE mg/dL
Specific Gravity, Urine: 1.01 (ref 1.005–1.030)
WBC, UA: >50 WBC/hpf (ref 0–5)
pH: 6.5 (ref 5.0–8.0)

## 2022-06-26 MED ORDER — CEFUROXIME AXETIL 500 MG PO TABS: 500.0000 mg | ORAL_TABLET | Freq: Two times a day (BID) | ORAL | 0 refills | Status: DC

## 2022-06-26 NOTE — Progress Notes (Signed)
06/26/2022 4:32 PM   Michelle [name] [birth year] [id number]  Referring provider: Mylinda Latina, PA-C [name],   [id number]  Urological history: 1.  Right hydronephrosis -CT (10/2019) - Chronic hydroureteronephrosis on the right without evidence of an obstructing stone. Findings appear quite similar to the study of 2014 and presumably relate to a distal ureteral structure. Patient has a degree of bladder prolapse which could possibly be affecting the UVJ. -Renal Lasix scan (10/2019) - No findings of high-grade obstructive uropathy bilaterally.  Delayed clearance of the radiopharmaceutical from dilated right renal collecting system which washes out following Lasix administration.  Split renal function is equal to 61.1 % from the left kidney and 38.9% from the right kidney. -Serum creatinine  (05/2022) 0.83  2. rUTI's -Contributing factors of age, vaginal atrophy, pessary, incontinence and right hydronephrosis -Documented urine cultures over the last year  None -Vaginal estrogen cream  3. Incontinence -Contributing factors of age, vaginal atrophy, cystocele, hypertension and anxiety -pessary   Chief Complaint  Patient presents with   Recurrent UTI    HPI: Michelle [name] is a 72 y.o. female who presents today for possible UTI.  Yesterday she felt fine, but towards the evening she started to develop lower back pain.  She attributed to a muscle skeletal issue, but she woke up in the early morning with urgency and frequency progressing to dysuria.  Patient denies any modifying or aggravating factors.  Patient denies any gross hematuria or suprapubic/flank pain.  Patient denies any fevers, chills, nausea or vomiting.    She does have a pessary in place which she was instructed to take out every 3 months for cleaning.  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  She said she took it out a couple days ago  and cleaned it and had some difficulty replacing it and she feels that this may have set off her infection.  She states she also had a urinary tract infection back in December for which she was given Macrobid.  UA is straw hazy, specific gravity 1.010, pH 6.5, moderate hemoglobin, moderate leukocyte, 0-5 squamous epithelial cells, greater than 50 WBCs, 0-5 RBCs, many bacteria and WBCs clumps present.     PMH: Past Medical History:  Diagnosis Date   Bladder incontinence    Hypertension    Hypothyroidism     Surgical History: Past Surgical History:  Procedure Laterality Date   child birth natural     diatation      Home Medications:  Allergies as of 06/26/2022       Reactions   Sulfa Antibiotics Hives, Nausea And Vomiting        Medication List        Accurate as of June 26, 2022  4:32 PM. If you have any questions, ask your nurse or doctor.          STOP taking these medications    amoxicillin-clavulanate 875-125 MG tablet Commonly known as: AUGMENTIN Stopped by: Daemyn Gariepy, PA-C   chlorpheniramine-HYDROcodone 10-8 MG/5ML Commonly known as: TUSSIONEX Stopped by: Treyven Lafauci, PA-C   nitrofurantoin (macrocrystal-monohydrate) 100 MG capsule Commonly known as: MACROBID Stopped by: Kamden Stanislaw, PA-C   nitrofurantoin 50 MG capsule Commonly known as: MACRODANTIN Stopped by: Zara Council, PA-C       TAKE these medications    ALPRAZolam 0.25 MG tablet Commonly known as: XANAX Take one tab po qhs prn for anxiety  amLODipine 5 MG tablet Commonly known as: NORVASC Take 1 tablet (5 mg total) by mouth daily.   aspirin EC 81 MG tablet Take 81 mg by mouth daily.   cefUROXime 500 MG tablet Commonly known as: CEFTIN Take 1 tablet (500 mg total) by mouth 2 (two) times daily with a meal. Started by: Larene Beach Dontarious Schaum, PA-C   diclofenac Sodium 1 % Gel Commonly known as: Voltaren Apply 2 g topically 4 (four) times daily.   estradiol 0.1  MG/GM vaginal cream Commonly known as: ESTRACE Estrogen Cream Instruction Discard applicator Apply pea sized amount to tip of finger to urethra before bed. Wash hands well after application. Use Monday, Wednesday and Friday What changed: when to take this   estradiol 0.5 MG tablet Commonly known as: ESTRACE Take 1 tablet (0.5 mg total) by mouth daily.   ibuprofen 800 MG tablet Commonly known as: ADVIL Take 1 tablet (800 mg total) by mouth daily as needed.   levothyroxine 88 MCG tablet Commonly known as: SYNTHROID TAKE 1 TABLET BY MOUTH ONCE DAILY BEFORE BREAKFAST   rosuvastatin 5 MG tablet Commonly known as: Crestor Take one tab 2 x a week for atherosclerosis        Allergies:  Allergies  Allergen Reactions   Sulfa Antibiotics Hives and Nausea And Vomiting    Family History: Family History  Problem Relation Age of Onset   Cancer Father    Hyperlipidemia Father    Hypertension Father    Breast cancer Neg Hx     Social History:  reports that she has quit smoking. Her smoking use included cigarettes. She has never used smokeless tobacco. She reports that she does not drink alcohol and does not use drugs.  ROS: Pertinent ROS in HPI  Physical Exam: BP (!) 148/87 (BP Location: Left Arm, Patient Position: Sitting, Cuff Size: Normal)   Pulse 87   Temp 97.7 F (36.5 C) (Oral)   Ht 5\' 2"  (1.575 m)   Wt 142 lb (64.4 kg)   SpO2 96%   BMI 25.97 kg/m   Constitutional:  Well nourished. Alert and oriented, No acute distress. HEENT: [place] AT, moist mucus membranes.  Trachea midline Cardiovascular: No clubbing, cyanosis, or edema. Respiratory: Normal respiratory effort, no increased work of breathing. Neurologic: Grossly intact, no focal deficits, moving all 4 extremities. Psychiatric: Normal mood and affect.  Laboratory Data: Lab Results  Component Value Date   WBC 6.5 05/23/2022   HGB 15.6 05/23/2022   HCT 46.6 05/23/2022   MCV 87 05/23/2022   PLT 215 05/23/2022     Lab Results  Component Value Date   CREATININE 0.83 05/23/2022    Lab Results  Component Value Date   TSH 3.030 05/23/2022       Component Value Date/Time   CHOL 163 05/23/2022 0916   HDL 46 05/23/2022 0916   LDLCALC 91 05/23/2022 0916    Lab Results  Component Value Date   AST 19 05/23/2022   Lab Results  Component Value Date   ALT 18 05/23/2022    Urinalysis Component     Latest Ref Rng 06/26/2022  Glucose, UA     NEGATIVE mg/dL NEGATIVE   WBC, UA     0 - 5 WBC/hpf >50   Bacteria, UA     NONE SEEN  MANY !   Appearance     CLEAR  HAZY !   Color, Urine     YELLOW  STRAW !   Specific Gravity, Urine  1.005 - 1.030  1.010   pH     5.0 - 8.0  6.5   Hgb urine dipstick     NEGATIVE  MODERATE !   Bilirubin Urine     NEGATIVE  NEGATIVE   Ketones, ur     NEGATIVE mg/dL NEGATIVE   Protein     NEGATIVE mg/dL NEGATIVE   Nitrite     NEGATIVE  NEGATIVE   Leukocytes,Ua     NEGATIVE  MODERATE !   RBC / HPF     0 - 5 RBC/hpf 0-5   Squamous Epithelial / HPF     0 - 5 /HPF 0-5   WBC Clumps PRESENT     Legend: ! Abnormal I have reviewed the labs.   Pertinent Imaging: N/A  Assessment & Plan:    1. rUTI's -Discussed that this is a recent recurrence after UTI in December and her thoughts regarding Dr. 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 Cystocele -Since she is having a difficult time managing her pessary due to arthritis, I did offer assistance in removing the pessary replacing the pessary every 3 months if gynecology is not able to provide this service  3.  Incontinence -If she is finding that the pessary area is becoming more more troublesome, I offered an  appointment with Dr. Matilde Sprang for further evaluation of her incontinence, she deferred for the time being  Return for pending urine cutlure results .  These notes generated with voice recognition software. I apologize for typographical errors.  [place], [place] [address]  [place], [place] [address] [phone]

## 2022-06-27 LAB — URINE CULTURE: Culture: <10000 — AB

## 2022-06-28 ENCOUNTER — Telehealth: Payer: Self-pay | Admitting: Family Medicine

## 2022-06-28 NOTE — Telephone Encounter (Signed)
-----   Message from Nori Riis, PA-C sent at 06/28/2022  9:13 AM EST ----- Please let Mrs. Hettich know that her urine culture was negative for infection.  Her symptoms may be from the pessary and not an UTI.  If she is not feeling better, I can see her and reposition the pessary.

## 2022-06-28 NOTE — Telephone Encounter (Signed)
LMOM for patient to return call.

## 2022-06-29 ENCOUNTER — Encounter: Payer: Self-pay | Admitting: Family Medicine

## 2022-07-03 NOTE — Telephone Encounter (Signed)
Notified patient as instructed, patient states she is feeling better.

## 2022-08-23 ENCOUNTER — Encounter: Payer: Self-pay | Admitting: Urology

## 2022-08-23 ENCOUNTER — Ambulatory Visit (INDEPENDENT_AMBULATORY_CARE_PROVIDER_SITE_OTHER): Payer: PPO | Admitting: Urology

## 2022-08-23 VITALS — BP 144/85 | HR 81 | Ht 62.0 in | Wt 141.4 lb

## 2022-08-23 DIAGNOSIS — N3946 Mixed incontinence: Secondary | ICD-10-CM | POA: Diagnosis not present

## 2022-08-23 DIAGNOSIS — R3129 Other microscopic hematuria: Secondary | ICD-10-CM | POA: Diagnosis not present

## 2022-08-23 DIAGNOSIS — R300 Dysuria: Secondary | ICD-10-CM

## 2022-08-23 DIAGNOSIS — N390 Urinary tract infection, site not specified: Secondary | ICD-10-CM | POA: Diagnosis not present

## 2022-08-23 DIAGNOSIS — N8111 Cystocele, midline: Secondary | ICD-10-CM | POA: Diagnosis not present

## 2022-08-23 DIAGNOSIS — R350 Frequency of micturition: Secondary | ICD-10-CM

## 2022-08-23 LAB — URINALYSIS, COMPLETE
Bilirubin, UA: NEGATIVE
Glucose, UA: NEGATIVE
Ketones, UA: NEGATIVE
Nitrite, UA: NEGATIVE
Specific Gravity, UA: <1.005 — ABNORMAL LOW (ref 1.005–1.030)
Urobilinogen, Ur: 0.2 mg/dL (ref 0.2–1.0)
pH, UA: 6.5 (ref 5.0–7.5)

## 2022-08-23 LAB — BLADDER SCAN AMB NON-IMAGING

## 2022-08-23 LAB — MICROSCOPIC EXAMINATION: WBC, UA: >30 /hpf — AB (ref 0–5)

## 2022-08-23 MED ORDER — CEFUROXIME AXETIL 250 MG PO TABS: 250.0000 mg | ORAL_TABLET | Freq: Two times a day (BID) | ORAL | 0 refills | Status: DC

## 2022-08-23 NOTE — Progress Notes (Signed)
08/23/2022 2:36 PM   [place] [birth year] [id number]  Referring provider: Mylinda Latina, PA-C [place],  [place] [address]  Urological history: 1.  Right hydronephrosis -CT (10/2019) - Chronic hydroureteronephrosis on the right without evidence of an obstructing stone. Findings appear quite similar to the study of 2014 and presumably relate to a distal ureteral structure. Patient has a degree of bladder prolapse which could possibly be affecting the UVJ. -Renal Lasix scan (10/2019) - No findings of high-grade obstructive uropathy bilaterally.  Delayed clearance of the radiopharmaceutical from dilated right renal collecting system which washes out following Lasix administration.  Split renal function is equal to 61.1 % from the left kidney and 38.9% from the right kidney. -Serum creatinine  (05/2022) 0.83  2. rUTI's -Contributing factors of age, vaginal atrophy, pessary, incontinence and right hydronephrosis -Documented urine cultures over the last year  06/26/2022 <10,000 -Vaginal estrogen cream  3. Incontinence -Contributing factors of age, vaginal atrophy, cystocele, hypertension and anxiety -pessary   Chief Complaint  Patient presents with   Other    Urinary issues    HPI: Michelle [name] is a 72 y.o. female who presents today for when she urinates, she is hurting down there this morning with stinging and burning and also she's been up and down all night, says she just goes to the bathroom too much.  She states yesterday she was working in the yard, came in and took a shower and then she was up and down all night urinating with pain.  She states she feels that she is on fire down there and she actually took a mirror out and took a look and said she is all red.  In regards to her up and down all night urinating, she states that this has been occurring for a long time and did not discharge last night.  Patient denies any modifying or aggravating  factors.  Patient denies any gross hematuria, dysuria or suprapubic/flank pain.  Patient denies any fevers, chills, nausea or vomiting.    UA yellow cloudy, specific a less than 1.005, 2+ blood, pH 6.5, 1+ protein, 3+ leukocyte, greater than 30 WBCs, 11-30 RBCs, 0-2 epithelial cells and many bacteria.  PVR 9 mL  She removed her pessary 2 weeks ago.  She felt it contributed to her urinary frequency.  PMH: Past Medical History:  Diagnosis Date   Bladder incontinence    Hypertension    Hypothyroidism     Surgical History: Past Surgical History:  Procedure Laterality Date   child birth natural     diatation      Home Medications:  Allergies as of 08/23/2022       Reactions   Sulfa Antibiotics Hives, Nausea And Vomiting        Medication List        Accurate as of August 23, 2022  2:36 PM. If you have any questions, ask your nurse or doctor.          STOP taking these medications    diclofenac Sodium 1 % Gel Commonly known as: Voltaren Stopped by: Desarie Feild, PA-C   estradiol 0.1 MG/GM vaginal cream Commonly known as: ESTRACE Stopped by: Laken Rog, PA-C   ibuprofen 800 MG tablet Commonly known as: ADVIL Stopped by: Zara Council, PA-C       TAKE these medications    ALPRAZolam 0.25 MG tablet Commonly known as: XANAX Take one tab po qhs prn for anxiety   amLODipine 5  MG tablet Commonly known as: NORVASC Take 1 tablet (5 mg total) by mouth daily.   aspirin EC 81 MG tablet Take 81 mg by mouth daily.   cefUROXime 250 MG tablet Commonly known as: CEFTIN Take 1 tablet (250 mg total) by mouth 2 (two) times daily with a meal. What changed:  medication strength how much to take Changed by: Cliffton Spradley, PA-C   estradiol 0.5 MG tablet Commonly known as: ESTRACE Take 1 tablet (0.5 mg total) by mouth daily.   levothyroxine 88 MCG tablet Commonly known as: SYNTHROID TAKE 1 TABLET BY MOUTH ONCE DAILY BEFORE BREAKFAST   rosuvastatin 5 MG  tablet Commonly known as: Crestor Take one tab 2 x a week for atherosclerosis        Allergies:  Allergies  Allergen Reactions   Sulfa Antibiotics Hives and Nausea And Vomiting    Family History: Family History  Problem Relation Age of Onset   Cancer Father    Hyperlipidemia Father    Hypertension Father    Breast cancer Neg Hx     Social History:  reports that she has quit smoking. Her smoking use included cigarettes. She has never used smokeless tobacco. She reports that she does not drink alcohol and does not use drugs.  ROS: Pertinent ROS in HPI  Physical Exam: BP (!) 144/85   Pulse 81   Ht 5\' 2"  (1.575 m)   Wt 141 lb 6.4 oz (64.1 kg)   BMI 25.86 kg/m   Constitutional:  Well nourished. Alert and oriented, No acute distress. HEENT: [place] AT, moist mucus membranes.  Trachea midline, no masses. Cardiovascular: No clubbing, cyanosis, or edema. Respiratory: Normal respiratory effort, no increased work of breathing. GU: No CVA tenderness.  No bladder fullness or masses. Vulvovaginal atrophy w/ pallor, loss of rugae, introital retraction, excoriations.  Vulvar thinning, fusion of labia, clitoral hood retraction, prominent urethral meatus w/ caruncle.  Pain in the urethra with palpation, but no drainage or induration noted.  No bladder fullness, tenderness or masses. Pale vagina mucosa, fair estrogen effect, no discharge, no lesions, poor pelvic support, grade II cystocele and grade II rectocele noted.  Anus and perineum are without rashes or lesions.    Neurologic: Grossly intact, no focal deficits, moving all 4 extremities. Psychiatric: Normal mood and affect.    Laboratory Data: Urinalysis See EPIC and HPI I have reviewed the labs.   Pertinent Imaging:  08/23/22 13:58  Scan Result 66ml    Assessment & Plan:    1. rUTI's -UA appears grossly infected -Urine sent for culture -Ceftin 250 mg twice daily sent to pharmacy -Difficult to discern whether or not her pain  is from a urinary tract infection versus vaginal atrophy and irritation of her cystocele -Will have also start applying the vaginal estrogen cream nightly for 2 weeks, samples given  2. Cystocele/Vaginal atrophy -Pessary has been removed by patient -Continue with vaginal estrogen cream    3.  Microscopic hematuria -UA with 11-30 RBCs, may be secondary to infection or urethral carbuncle -Will recheck on return in 2 weeks   Return in about 2 weeks (around 09/06/2022) for follow up for symptom recheck and UA .  These notes generated with voice recognition software. I apologize for typographical errors.  [place], [place] [address]  [place], [place] [address] [phone]

## 2022-08-28 LAB — CULTURE, URINE COMPREHENSIVE

## 2022-08-29 ENCOUNTER — Telehealth: Payer: Self-pay | Admitting: Family Medicine

## 2022-08-29 NOTE — Telephone Encounter (Signed)
Patient notified and voiced understanding.

## 2022-08-29 NOTE — Telephone Encounter (Signed)
-----   Message from Harle Battiest, PA-C sent at 08/28/2022  1:53 PM EDT ----- Please let Mrs. Radabaugh know that her urine culture was positive for infection and to continue the Ceftin.  We will see her on the 18th.

## 2022-09-06 NOTE — Progress Notes (Signed)
09/07/2022 6:51 PM   Michelle [name] [birth year] [id number]  Referring provider: Carlean Jews, PA-C [address] [place],  [place] [address]  Urological history: 1.  Right hydronephrosis -CT (10/2019) - Chronic hydroureteronephrosis on the right without evidence of an obstructing stone. Findings appear quite similar to the study of 2014 and presumably relate to a distal ureteral structure. Patient has a degree of bladder prolapse which could possibly be affecting the UVJ. -Renal Lasix scan (10/2019) - No findings of high-grade obstructive uropathy bilaterally.  Delayed clearance of the radiopharmaceutical from dilated right renal collecting system which washes out following Lasix administration.  Split renal function is equal to 61.1 % from the left kidney and 38.9% from the right kidney. -Serum creatinine  (05/2022) 0.83  2. rUTI's -Contributing factors of age, vaginal atrophy, pessary, incontinence and right hydronephrosis -Documented urine cultures over the last year  August 23, 2018 for E. coli  06/26/2022 <10,000 colonies -Vaginal estrogen cream  3. Incontinence -Contributing factors of age, vaginal atrophy, cystocele, hypertension and anxiety -pessary   Chief Complaint  Patient presents with   Recurrent UTI    HPI: Michelle [name] is a 72 y.o. female who presents today for follow up.  At her visit on 08/23/2022, she stated she was working in the yard, came in and took a shower and then she was up and down all night urinating with pain.  She stated she felt that she was on fire down there and she actually took a mirror out and looked and said she was all red.  In regards to her up and down all night urinating, she stated that this has been occurring for a long time and did not discharge last night.  Patient denies any modifying or aggravating factors.  Patient denies any gross hematuria, dysuria or suprapubic/flank pain.  Patient denies any fevers, chills, nausea or  vomiting.   UA yellow cloudy, specific a less than 1.005, 2+ blood, pH 6.5, 1+ protein, 3+ leukocyte, greater than 30 WBCs, 11-30 RBCs, 0-2 epithelial cells and many bacteria.  PVR 9 mL.  She removed her pessary 2 weeks ago.  She felt it contributed to her urinary frequency.  Urine culture was positive for E. coli.  She was treated with culture appropriate antibiotics.  She was also given a sample of vaginal estrogen cream and instructed to apply it nightly for the next 2 weeks.  She states in the last 2 weeks have been miserable.  She feels that is due to her bulge in her vagina.  She states it is raw feeling between her legs and if she stands for period of time, it becomes painful and she needs to sit down.  This is very frustrating to her because she is a very active person and with the summer coming up, she is concerned that it may cause her to be more sedentary and this will severely impact her quality of life.    She has the pessary, but she does not feel that it meets her goals with her pelvic symptoms.  It is sometimes uncomfortable and she has difficulty inserting it and taking out to clean it secondary to her carpal tunnel.  She is applying the vaginal estrogen cream as instructed.   Patient denies any modifying or aggravating factors.  Patient denies any gross hematuria, dysuria or suprapubic/flank pain.  Patient denies any fevers, chills, nausea or vomiting.    UA dip could not be performed due to color interference,  WBC greater than 30, 0-2 RBCs, 0-2 epithelial cells, mucus threads are present with moderate bacteria.  PMH: Past Medical History:  Diagnosis Date   Bladder incontinence    Hypertension    Hypothyroidism     Surgical History: Past Surgical History:  Procedure Laterality Date   child birth natural     diatation      Home Medications:  Allergies as of 09/07/2022       Reactions   Sulfa Antibiotics Hives, Nausea And Vomiting        Medication List         Accurate as of September 07, 2022 11:59 PM. If you have any questions, ask your nurse or doctor.          STOP taking these medications    cefUROXime 250 MG tablet Commonly known as: CEFTIN Stopped by: Michiel Cowboy, PA-C       TAKE these medications    ALPRAZolam 0.25 MG tablet Commonly known as: XANAX Take one tab po qhs prn for anxiety   amLODipine 5 MG tablet Commonly known as: NORVASC Take 1 tablet (5 mg total) by mouth daily.   aspirin EC 81 MG tablet Take 81 mg by mouth daily.   estradiol 0.1 MG/GM vaginal cream Commonly known as: ESTRACE VAGINAL Apply 0.5mg  (pea-sized amount)  just inside the vaginal introitus with a finger-tip on Monday, Wednesday and Friday nights. Started by: Michiel Cowboy, PA-C   estradiol 0.5 MG tablet Commonly known as: ESTRACE Take 1 tablet (0.5 mg total) by mouth daily.   levothyroxine 88 MCG tablet Commonly known as: SYNTHROID TAKE 1 TABLET BY MOUTH ONCE DAILY BEFORE BREAKFAST   rosuvastatin 5 MG tablet Commonly known as: Crestor Take one tab 2 x a week for atherosclerosis        Allergies:  Allergies  Allergen Reactions   Sulfa Antibiotics Hives and Nausea And Vomiting    Family History: Family History  Problem Relation Age of Onset   Cancer Father    Hyperlipidemia Father    Hypertension Father    Breast cancer Neg Hx     Social History:  reports that she has quit smoking. Her smoking use included cigarettes. She has never used smokeless tobacco. She reports that she does not drink alcohol and does not use drugs.  ROS: Pertinent ROS in HPI  Physical Exam: BP 123/72   Pulse 80   Ht 5\' 2"  (1.575 m)   Wt 140 lb (63.5 kg)   BMI 25.61 kg/m   Constitutional:  Well nourished. Alert and oriented, No acute distress. HEENT: [place] AT, moist mucus membranes.  Trachea midline Cardiovascular: No clubbing, cyanosis, or edema. Respiratory: Normal respiratory effort, no increased work of breathing. Neurologic: Grossly  intact, no focal deficits, moving all 4 extremities. Psychiatric: Normal mood and affect.    Laboratory Data: Urinalysis See EPIC and HPI I have reviewed the labs.   Pertinent Imaging: N/A  Assessment & Plan:    1. rUTI's -UA pyuria and bacteriuria -urine is sent for culture -Will hold on prescribing antibiotic at this time as her symptoms are more likely due to the prolapse versus UTI  2. Cystocele/Vaginal atrophy -She is now experiencing extreme discomfort with her pelvic prolapse and she feels that the pessary is not a sustainable method of management for her symptoms as she has difficulty placing the pessary secondary to her carpal tunnel and she finds the pessary uncomfortable -She would like a referral to urogynecology at this time to discuss  further options  3.  Microscopic hematuria - UA negative for microscopic hematuria   Return for refer to urogynecology for pelvic floor prolaspe .  These notes generated with voice recognition software. I apologize for typographical errors.  Cloretta Ned  [facility] [address]  [address] [place], [place] [address] [phone]

## 2022-09-07 ENCOUNTER — Encounter: Payer: Self-pay | Admitting: Urology

## 2022-09-07 ENCOUNTER — Ambulatory Visit: Payer: PPO | Admitting: Urology

## 2022-09-07 ENCOUNTER — Other Ambulatory Visit: Payer: Self-pay | Admitting: Urology

## 2022-09-07 VITALS — BP 123/72 | HR 80 | Ht 62.0 in | Wt 140.0 lb

## 2022-09-07 DIAGNOSIS — N8111 Cystocele, midline: Secondary | ICD-10-CM | POA: Diagnosis not present

## 2022-09-07 DIAGNOSIS — R8281 Pyuria: Secondary | ICD-10-CM

## 2022-09-07 DIAGNOSIS — R8271 Bacteriuria: Secondary | ICD-10-CM | POA: Diagnosis not present

## 2022-09-07 DIAGNOSIS — N819 Female genital prolapse, unspecified: Secondary | ICD-10-CM

## 2022-09-07 DIAGNOSIS — Z87440 Personal history of urinary (tract) infections: Secondary | ICD-10-CM

## 2022-09-07 DIAGNOSIS — N952 Postmenopausal atrophic vaginitis: Secondary | ICD-10-CM | POA: Diagnosis not present

## 2022-09-07 DIAGNOSIS — N390 Urinary tract infection, site not specified: Secondary | ICD-10-CM | POA: Diagnosis not present

## 2022-09-07 DIAGNOSIS — R3129 Other microscopic hematuria: Secondary | ICD-10-CM

## 2022-09-07 LAB — URINALYSIS, COMPLETE

## 2022-09-07 LAB — MICROSCOPIC EXAMINATION: WBC, UA: >30 /hpf — AB (ref 0–5)

## 2022-09-07 MED ORDER — ESTRADIOL 0.1 MG/GM VA CREA: TOPICAL_CREAM | VAGINAL | 12 refills | Status: DC

## 2022-09-11 ENCOUNTER — Telehealth: Payer: Self-pay | Admitting: Family Medicine

## 2022-09-11 ENCOUNTER — Other Ambulatory Visit: Payer: Self-pay | Admitting: Urology

## 2022-09-11 DIAGNOSIS — N819 Female genital prolapse, unspecified: Secondary | ICD-10-CM

## 2022-09-11 DIAGNOSIS — N8111 Cystocele, midline: Secondary | ICD-10-CM

## 2022-09-11 LAB — CULTURE, URINE COMPREHENSIVE

## 2022-09-11 MED ORDER — CEFUROXIME AXETIL 500 MG PO TABS: 500.0000 mg | ORAL_TABLET | Freq: Two times a day (BID) | ORAL | 0 refills | Status: DC

## 2022-09-11 NOTE — Telephone Encounter (Signed)
Patient notified and would like to try [facility]. ABX sent to pharmacy.

## 2022-09-11 NOTE — Telephone Encounter (Signed)
-----   Message from Harle Battiest, PA-C sent at 09/11/2022 12:21 PM EDT ----- Please let Michelle [name] know that her urine culture was positive for infection.  I like for her to start Ceftin 500 mg twice daily for 7 days.  I also received a message from the urogynecologist's office in [place] that they would not be able to see her until August.  Would she like me to see if the folks at [facility] can see her sooner?

## 2022-09-18 ENCOUNTER — Other Ambulatory Visit: Payer: Self-pay | Admitting: Physician Assistant

## 2022-09-18 DIAGNOSIS — E039 Hypothyroidism, unspecified: Secondary | ICD-10-CM

## 2022-09-19 ENCOUNTER — Ambulatory Visit
Admission: RE | Admit: 2022-09-19 | Discharge: 2022-09-19 | Disposition: A | Discharge status: Home / Self Care | Payer: PPO | Source: Ambulatory Visit | Attending: Physician Assistant | Admitting: Physician Assistant

## 2022-09-19 DIAGNOSIS — Z1231 Encounter for screening mammogram for malignant neoplasm of breast: Secondary | ICD-10-CM

## 2022-09-21 ENCOUNTER — Ambulatory Visit (INDEPENDENT_AMBULATORY_CARE_PROVIDER_SITE_OTHER): Payer: Self-pay | Admitting: Physician Assistant

## 2022-09-21 ENCOUNTER — Encounter: Payer: Self-pay | Admitting: Physician Assistant

## 2022-09-21 VITALS — BP 120/79 | HR 75 | Temp 98.4°F | Resp 16 | Ht 62.0 in | Wt 143.6 lb

## 2022-09-21 DIAGNOSIS — I10 Essential (primary) hypertension: Secondary | ICD-10-CM | POA: Diagnosis not present

## 2022-09-21 DIAGNOSIS — G4709 Other insomnia: Secondary | ICD-10-CM | POA: Diagnosis not present

## 2022-09-21 DIAGNOSIS — R319 Hematuria, unspecified: Secondary | ICD-10-CM | POA: Diagnosis not present

## 2022-09-21 DIAGNOSIS — I700 Atherosclerosis of aorta: Secondary | ICD-10-CM | POA: Diagnosis not present

## 2022-09-21 DIAGNOSIS — N390 Urinary tract infection, site not specified: Secondary | ICD-10-CM

## 2022-09-21 DIAGNOSIS — R300 Dysuria: Secondary | ICD-10-CM | POA: Diagnosis not present

## 2022-09-21 LAB — POCT URINALYSIS DIPSTICK
Bilirubin, UA: NEGATIVE
Glucose, UA: NEGATIVE
Ketones, UA: POSITIVE
Protein, UA: POSITIVE — AB
Spec Grav, UA: 1.01 (ref 1.010–1.025)
Urobilinogen, UA: 0.2 E.U./dL
pH, UA: 5 (ref 5.0–8.0)

## 2022-09-21 MED ORDER — NITROFURANTOIN MONOHYD MACRO 100 MG PO CAPS: ORAL_CAPSULE | ORAL | 0 refills | Status: DC

## 2022-09-21 MED ORDER — ROSUVASTATIN CALCIUM 5 MG PO TABS: ORAL_TABLET | ORAL | 3 refills | Status: DC

## 2022-09-21 MED ORDER — AMLODIPINE BESYLATE 5 MG PO TABS: 5.0000 mg | ORAL_TABLET | Freq: Every day | ORAL | 1 refills | Status: DC

## 2022-09-21 NOTE — Progress Notes (Signed)
[facility] [address] [place], [place] [address]  Internal MEDICINE  Office Visit Note  Patient Name: Michelle [name]  [id number]  [id number]  Date of Service: 09/21/2022  Chief Complaint  Patient presents with   Follow-up   Hypertension   Urinary Tract Infection    Cloudy urine and frequency, symptoms began this morning    HPI Pt is here for routine follow up -Did start having UTI symptoms this morning. Has burning and frequency and right flank plain -Did see urology last month and is on vaginal estradiol due to recurrent UTIs. She was referred to urogyn due to UTIs plus prolapse, but hasn't heard from their office yet to schedule. She is going to call urology to follow up on this and notify them of another UTI. Has a pessary but hasn't been able to wear recently. Does have pressure from prolapse which limits her some. Doesn't feel she can be as active due to it. -Drinking plenty of fluids -Does have constipation and linzess hasnt helped in the past. Taking fiber supplement and may try adding magnesium.   Current Medication: Outpatient Encounter Medications as of 09/21/2022  Medication Sig   ALPRAZolam (XANAX) 0.25 MG tablet Take one tab po qhs prn for anxiety   aspirin EC 81 MG tablet Take 81 mg by mouth daily.   estradiol (ESTRACE VAGINAL) 0.1 MG/GM vaginal cream Apply 0.5mg  (pea-sized amount)  just inside the vaginal introitus with a finger-tip on Monday, Wednesday and Friday nights.   estradiol (ESTRACE) 0.5 MG tablet Take 1 tablet (0.5 mg total) by mouth daily.   levothyroxine (SYNTHROID) 88 MCG tablet TAKE 1 TABLET BY MOUTH ONCE DAILY BEFORE BREAKFAST   nitrofurantoin, macrocrystal-monohydrate, (MACROBID) 100 MG capsule Take 1 cap twice per day for 10 days.   [DISCONTINUED] amLODipine (NORVASC) 5 MG tablet Take 1 tablet (5 mg total) by mouth daily.   [DISCONTINUED] cefUROXime (CEFTIN) 500 MG tablet Take 1 tablet (500 mg total) by mouth 2 (two) times daily with a  meal.   [DISCONTINUED] rosuvastatin (CRESTOR) 5 MG tablet Take one tab 2 x a week for atherosclerosis   amLODipine (NORVASC) 5 MG tablet Take 1 tablet (5 mg total) by mouth daily.   rosuvastatin (CRESTOR) 5 MG tablet Take one tab 2 x a week for atherosclerosis   No facility-administered encounter medications on file as of 09/21/2022.    Surgical History: Past Surgical History:  Procedure Laterality Date   child birth natural     diatation      Medical History: Past Medical History:  Diagnosis Date   Bladder incontinence    Hypertension    Hypothyroidism     Family History: Family History  Problem Relation Age of Onset   Cancer Father    Hyperlipidemia Father    Hypertension Father    Breast cancer Neg Hx     Social History   Socioeconomic History   Marital status: Married    Spouse name: Not on file   Number of children: Not on file   Years of education: Not on file   Highest education level: Not on file  Occupational History   Not on file  Tobacco Use   Smoking status: Former    Types: Cigarettes   Smokeless tobacco: Never  Substance and Sexual Activity   Alcohol use: No   Drug use: No   Sexual activity: Not Currently    Birth control/protection: Surgical, Post-menopausal    Comment: Hysterectomy  Other Topics Concern  Not on file  Social History Narrative   Not on file   Social Determinants of Health   Financial Resource Strain: Not on file  Food Insecurity: Not on file  Transportation Needs: Not on file  Physical Activity: Not on file  Stress: Not on file  Social Connections: Not on file  Intimate Partner Violence: Not on file      Review of Systems  Constitutional:  Negative for chills, fatigue and unexpected weight change.  HENT:  Negative for congestion, postnasal drip, rhinorrhea, sneezing and sore throat.   Eyes:  Negative for redness.  Respiratory:  Negative for cough, chest tightness and shortness of breath.   Cardiovascular:   Negative for chest pain and palpitations.  Gastrointestinal:  Negative for abdominal pain, constipation, diarrhea, nausea and vomiting.  Genitourinary:  Positive for dysuria, flank pain, frequency, pelvic pain and urgency.  Musculoskeletal:  Negative for back pain, joint swelling and neck pain.  Skin:  Negative for rash.  Neurological: Negative.  Negative for tremors and numbness.  Hematological:  Negative for adenopathy. Does not bruise/bleed easily.  Psychiatric/Behavioral:  Negative for behavioral problems (Depression), sleep disturbance and suicidal ideas. The patient is not nervous/anxious.     Vital Signs: BP 120/79   Pulse 75   Temp 98.4 F (36.9 C)   Resp 16   Ht 5\' 2"  (1.575 m)   Wt 143 lb 9.6 oz (65.1 kg)   SpO2 97%   BMI 26.26 kg/m    Physical Exam Constitutional:      Appearance: Normal appearance.  HENT:     Head: Normocephalic and atraumatic.     Nose: Nose normal.     Mouth/Throat:     Mouth: Mucous membranes are moist.     Pharynx: No posterior oropharyngeal erythema.  Eyes:     Extraocular Movements: Extraocular movements intact.     Pupils: Pupils are equal, round, and reactive to light.  Cardiovascular:     Rate and Rhythm: Normal rate and regular rhythm.     Pulses: Normal pulses.     Heart sounds: Normal heart sounds.  Pulmonary:     Effort: Pulmonary effort is normal.     Breath sounds: Normal breath sounds.  Chest:     Chest wall: No tenderness.  Breasts:    Right: Normal. No mass.     Left: Normal. No mass.  Abdominal:     General: Abdomen is flat.     Tenderness: There is right CVA tenderness.  Skin:    General: Skin is warm and dry.  Neurological:     General: No focal deficit present.     Mental Status: She is alert.  Psychiatric:        Mood and Affect: Mood normal.        Behavior: Behavior normal.        Assessment/Plan: 1. Essential hypertension Stable, continue current medication - amLODipine (NORVASC) 5 MG tablet;  Take 1 tablet (5 mg total) by mouth daily.  Dispense: 90 tablet; Refill: 1  2. Aortic atherosclerosis (HCC) Continue crestor as before - rosuvastatin (CRESTOR) 5 MG tablet; Take one tab 2 x a week for atherosclerosis  Dispense: 24 tablet; Refill: 3  3. Urinary tract infection with hematuria, site unspecified Will start on macrobid and adjust based on C/S - CULTURE, URINE COMPREHENSIVE - nitrofurantoin, macrocrystal-monohydrate, (MACROBID) 100 MG capsule; Take 1 cap twice per day for 10 days.  Dispense: 20 capsule; Refill: 0  4. Dysuria - POCT Urinalysis Dipstick  General Counseling: meline russaw understanding of the findings of todays visit and agrees with plan of treatment. I have discussed any further diagnostic evaluation that may be needed or ordered today. We also reviewed her medications today. she has been encouraged to call the office with any questions or concerns that should arise related to todays visit.    Orders Placed This Encounter  Procedures   CULTURE, URINE COMPREHENSIVE   POCT Urinalysis Dipstick    Meds ordered this encounter  Medications   nitrofurantoin, macrocrystal-monohydrate, (MACROBID) 100 MG capsule    Sig: Take 1 cap twice per day for 10 days.    Dispense:  20 capsule    Refill:  0   amLODipine (NORVASC) 5 MG tablet    Sig: Take 1 tablet (5 mg total) by mouth daily.    Dispense:  90 tablet    Refill:  1   rosuvastatin (CRESTOR) 5 MG tablet    Sig: Take one tab 2 x a week for atherosclerosis    Dispense:  24 tablet    Refill:  3    This patient was seen by Lynn Ito, PA-C in collaboration with Dr. Beverely Risen as a part of collaborative care agreement.   Total time spent:30 Minutes Time spent includes review of chart, medications, test results, and follow up plan with the patient.      Dr [name] Internal medicine

## 2022-09-25 LAB — CULTURE, URINE COMPREHENSIVE

## 2022-09-26 ENCOUNTER — Telehealth: Payer: Self-pay

## 2022-09-26 NOTE — Telephone Encounter (Signed)
-----   Message from Carlean Jews, PA-C sent at 09/25/2022  4:09 PM EDT ----- Please let her know that her urine did confirm UTI with E. Coli, it showed intermediate response to the Macrobid she is taking which means it may or may not treat it completely and to call if continued symptoms and we can switch to an alternative if needed

## 2022-09-26 NOTE — Telephone Encounter (Signed)
Done by Sharlet Salina

## 2022-09-26 NOTE — Telephone Encounter (Signed)
Spoke with patient regarding UTI, patient will call back if she feels ABX needs to be switched.

## 2022-10-03 DIAGNOSIS — N393 Stress incontinence (female) (male): Secondary | ICD-10-CM | POA: Diagnosis not present

## 2022-10-03 DIAGNOSIS — N812 Incomplete uterovaginal prolapse: Secondary | ICD-10-CM | POA: Diagnosis not present

## 2022-10-03 DIAGNOSIS — N952 Postmenopausal atrophic vaginitis: Secondary | ICD-10-CM | POA: Diagnosis not present

## 2022-10-03 DIAGNOSIS — R030 Elevated blood-pressure reading, without diagnosis of hypertension: Secondary | ICD-10-CM | POA: Diagnosis not present

## 2022-10-08 DIAGNOSIS — J069 Acute upper respiratory infection, unspecified: Secondary | ICD-10-CM | POA: Diagnosis not present

## 2022-10-09 ENCOUNTER — Ambulatory Visit (INDEPENDENT_AMBULATORY_CARE_PROVIDER_SITE_OTHER): Payer: Self-pay | Admitting: Physician Assistant

## 2022-10-09 ENCOUNTER — Encounter: Payer: Self-pay | Admitting: Physician Assistant

## 2022-10-09 VITALS — BP 120/85 | HR 93 | Temp 98.3°F | Resp 16 | Ht 62.0 in | Wt 141.8 lb

## 2022-10-09 DIAGNOSIS — N3000 Acute cystitis without hematuria: Secondary | ICD-10-CM | POA: Diagnosis not present

## 2022-10-09 DIAGNOSIS — J0100 Acute maxillary sinusitis, unspecified: Secondary | ICD-10-CM

## 2022-10-09 DIAGNOSIS — Z01818 Encounter for other preprocedural examination: Secondary | ICD-10-CM | POA: Diagnosis not present

## 2022-10-09 DIAGNOSIS — N390 Urinary tract infection, site not specified: Secondary | ICD-10-CM

## 2022-10-09 DIAGNOSIS — R200 Anesthesia of skin: Secondary | ICD-10-CM

## 2022-10-09 MED ORDER — DOXYCYCLINE HYCLATE 100 MG PO TABS: 100.0000 mg | ORAL_TABLET | Freq: Two times a day (BID) | ORAL | 0 refills | Status: DC

## 2022-10-09 MED ORDER — AMOXICILLIN-POT CLAVULANATE 875-125 MG PO TABS: 1.0000 | ORAL_TABLET | Freq: Two times a day (BID) | ORAL | 0 refills | Status: DC

## 2022-10-09 NOTE — Progress Notes (Unsigned)
[facility] [address] [place], [place] [address]  Internal MEDICINE  Office Visit Note  Patient Name: Michelle [name]  [id number]  [id number]  Date of Service: 10/12/2022  Chief Complaint  Patient presents with   Follow-up   Hypertension    HPI Pt is here for routine follow up for surgical clearance for robotic hysterectomy, sacrocolpopexy, midurethral sling and posterior repair -pt required EKG for pre op clearance and was done today and looks ok to proceed with surgery -She is on promethazine-dextromethophan by urgent care for sinus congestion. This may be increasing her HR some, but still in normal range. Continues to have sinus congestion -She had some labs done in Jan and will not need repeat other than if any necessary at preop testing per surgeon. Hasnt been scheduled for surgery yet and therefore doesn't have preop appt yet -Will hold 81mg  ASA for a week prior. Not on any true blood thinner -Still having urinary burning and will go ahead and treat with augmentin for sinus infection and uti based on last culture, but will send new culture to double check -BP stable  Current Medication: Outpatient Encounter Medications as of 10/09/2022  Medication Sig   ALPRAZolam (XANAX) 0.25 MG tablet Take one tab po qhs prn for anxiety   amLODipine (NORVASC) 5 MG tablet Take 1 tablet (5 mg total) by mouth daily.   amoxicillin-clavulanate (AUGMENTIN) 875-125 MG tablet Take 1 tablet by mouth 2 (two) times daily. Take with food.   aspirin EC 81 MG tablet Take 81 mg by mouth daily.   estradiol (ESTRACE VAGINAL) 0.1 MG/GM vaginal cream Apply 0.5mg  (pea-sized amount)  just inside the vaginal introitus with a finger-tip on Monday, Wednesday and Friday nights.   estradiol (ESTRACE) 0.5 MG tablet Take 1 tablet (0.5 mg total) by mouth daily.   levothyroxine (SYNTHROID) 88 MCG tablet TAKE 1 TABLET BY MOUTH ONCE DAILY BEFORE BREAKFAST   nitrofurantoin, macrocrystal-monohydrate,  (MACROBID) 100 MG capsule Take 1 cap twice per day for 10 days.   rosuvastatin (CRESTOR) 5 MG tablet Take one tab 2 x a week for atherosclerosis   [DISCONTINUED] doxycycline (VIBRA-TABS) 100 MG tablet Take 1 tablet (100 mg total) by mouth 2 (two) times daily.   No facility-administered encounter medications on file as of 10/09/2022.    Surgical History: Past Surgical History:  Procedure Laterality Date   child birth natural     diatation      Medical History: Past Medical History:  Diagnosis Date   Bladder incontinence    Hypertension    Hypothyroidism     Family History: Family History  Problem Relation Age of Onset   Cancer Father    Hyperlipidemia Father    Hypertension Father    Breast cancer Neg Hx     Social History   Socioeconomic History   Marital status: Married    Spouse name: Not on file   Number of children: Not on file   Years of education: Not on file   Highest education level: Not on file  Occupational History   Not on file  Tobacco Use   Smoking status: Former    Types: Cigarettes   Smokeless tobacco: Never  Substance and Sexual Activity   Alcohol use: No   Drug use: No   Sexual activity: Not Currently    Birth control/protection: Surgical, Post-menopausal    Comment: Hysterectomy  Other Topics Concern   Not on file  Social History Narrative   Not on file  Social Determinants of Health   Financial Resource Strain: Not on file  Food Insecurity: Not on file  Transportation Needs: Not on file  Physical Activity: Not on file  Stress: Not on file  Social Connections: Not on file  Intimate Partner Violence: Not on file      Review of Systems  Constitutional:  Negative for chills, fatigue and unexpected weight change.  HENT:  Positive for congestion and postnasal drip. Negative for rhinorrhea, sneezing and sore throat.   Eyes:  Negative for redness.  Respiratory:  Positive for cough. Negative for chest tightness and shortness of  breath.   Cardiovascular:  Negative for chest pain and palpitations.  Gastrointestinal:  Negative for abdominal pain, constipation, diarrhea, nausea and vomiting.  Genitourinary:  Positive for dysuria, frequency, pelvic pain and urgency.  Musculoskeletal:  Negative for back pain, joint swelling and neck pain.  Skin:  Negative for rash.  Neurological: Negative.  Negative for tremors and numbness.  Hematological:  Negative for adenopathy. Does not bruise/bleed easily.  Psychiatric/Behavioral:  Negative for behavioral problems (Depression), sleep disturbance and suicidal ideas. The patient is not nervous/anxious.     Vital Signs: BP 120/85   Pulse 93   Temp 98.3 F (36.8 C)   Resp 16   Ht 5\' 2"  (1.575 m)   Wt 141 lb 12.8 oz (64.3 kg)   SpO2 96%   BMI 25.94 kg/m    Physical Exam Constitutional:      Appearance: Normal appearance.  HENT:     Head: Normocephalic and atraumatic.     Nose: Nose normal.     Mouth/Throat:     Mouth: Mucous membranes are moist.     Pharynx: No posterior oropharyngeal erythema.  Eyes:     Extraocular Movements: Extraocular movements intact.     Pupils: Pupils are equal, round, and reactive to light.  Cardiovascular:     Rate and Rhythm: Normal rate and regular rhythm.     Pulses: Normal pulses.     Heart sounds: Normal heart sounds.  Pulmonary:     Effort: Pulmonary effort is normal.     Breath sounds: Normal breath sounds.  Chest:     Chest wall: No tenderness.  Breasts:    Right: Normal. No mass.     Left: Normal. No mass.  Abdominal:     General: Abdomen is flat.  Skin:    General: Skin is warm and dry.  Neurological:     General: No focal deficit present.     Mental Status: She is alert.  Psychiatric:        Mood and Affect: Mood normal.        Behavior: Behavior normal.        Assessment/Plan: 1. Preoperative clearance EKG reviewed with Dr. Welton Flakes and Preop clearance signed and faxed back to surgery office - EKG 12-Lead  2.  Acute cystitis without hematuria Will go ahead and treat with augmentin based on last C/S while also treating sinus infection and will send new culture - amoxicillin-clavulanate (AUGMENTIN) 875-125 MG tablet; Take 1 tablet by mouth 2 (two) times daily. Take with food.  Dispense: 14 tablet; Refill: 0  3. Urinary tract infection without hematuria, site unspecified - CULTURE, URINE COMPREHENSIVE  4. Acute non-recurrent maxillary sinusitis Will treat with augmentin for possible dual coverage of sinus and UTI   General Counseling: breleigh bixler understanding of the findings of todays visit and agrees with plan of treatment. I have discussed any further diagnostic evaluation that  may be needed or ordered today. We also reviewed her medications today. she has been encouraged to call the office with any questions or concerns that should arise related to todays visit.    Orders Placed This Encounter  Procedures   CULTURE, URINE COMPREHENSIVE   EKG 12-Lead    Meds ordered this encounter  Medications   DISCONTD: doxycycline (VIBRA-TABS) 100 MG tablet    Sig: Take 1 tablet (100 mg total) by mouth 2 (two) times daily.    Dispense:  14 tablet    Refill:  0   amoxicillin-clavulanate (AUGMENTIN) 875-125 MG tablet    Sig: Take 1 tablet by mouth 2 (two) times daily. Take with food.    Dispense:  14 tablet    Refill:  0    This patient was seen by Lynn Ito, PA-C in collaboration with Dr. Beverely Risen as a part of collaborative care agreement.   Total time spent:35 Minutes Time spent includes review of chart, medications, test results, and follow up plan with the patient.      Dr [name] Internal medicine

## 2022-10-12 LAB — CULTURE, URINE COMPREHENSIVE

## 2022-10-13 ENCOUNTER — Telehealth: Payer: Self-pay

## 2022-10-13 NOTE — Telephone Encounter (Signed)
Spoke with patient regarding urine culture results. 

## 2022-10-13 NOTE — Telephone Encounter (Signed)
-----   Message from Carlean Jews, PA-C sent at 10/12/2022  7:23 PM EDT ----- Please let her know that her urine only showed mixed flora now but may still complete augmentin for sinus infection

## 2022-10-19 ENCOUNTER — Telehealth: Payer: Self-pay | Admitting: Physician Assistant

## 2022-10-19 NOTE — Telephone Encounter (Signed)
Received call from [facility] stating fax # given to me by patient was incorrect. Faxed to [phone]

## 2022-10-19 NOTE — Telephone Encounter (Signed)
Received call from patient regarding surgical clearance form. No note in epic stating it had been faxed. Found completed form in media. Per patient's request, form faxed to [phone]

## 2022-10-24 DIAGNOSIS — N812 Incomplete uterovaginal prolapse: Secondary | ICD-10-CM | POA: Diagnosis not present

## 2022-10-24 DIAGNOSIS — N952 Postmenopausal atrophic vaginitis: Secondary | ICD-10-CM | POA: Diagnosis not present

## 2022-10-24 DIAGNOSIS — N393 Stress incontinence (female) (male): Secondary | ICD-10-CM | POA: Diagnosis not present

## 2022-10-24 DIAGNOSIS — Z01818 Encounter for other preprocedural examination: Secondary | ICD-10-CM | POA: Diagnosis not present

## 2022-10-27 DIAGNOSIS — N952 Postmenopausal atrophic vaginitis: Secondary | ICD-10-CM | POA: Diagnosis not present

## 2022-10-27 DIAGNOSIS — G8918 Other acute postprocedural pain: Secondary | ICD-10-CM | POA: Diagnosis not present

## 2022-10-27 DIAGNOSIS — N812 Incomplete uterovaginal prolapse: Secondary | ICD-10-CM | POA: Diagnosis not present

## 2022-10-27 DIAGNOSIS — N83201 Unspecified ovarian cyst, right side: Secondary | ICD-10-CM | POA: Diagnosis not present

## 2022-10-27 DIAGNOSIS — N393 Stress incontinence (female) (male): Secondary | ICD-10-CM | POA: Diagnosis not present

## 2022-12-04 DIAGNOSIS — N3000 Acute cystitis without hematuria: Secondary | ICD-10-CM | POA: Diagnosis not present

## 2022-12-04 DIAGNOSIS — R3989 Other symptoms and signs involving the genitourinary system: Secondary | ICD-10-CM | POA: Diagnosis not present

## 2022-12-04 DIAGNOSIS — Z6824 Body mass index (BMI) 24.0-24.9, adult: Secondary | ICD-10-CM | POA: Diagnosis not present

## 2022-12-04 DIAGNOSIS — Z09 Encounter for follow-up examination after completed treatment for conditions other than malignant neoplasm: Secondary | ICD-10-CM | POA: Diagnosis not present

## 2022-12-04 DIAGNOSIS — R030 Elevated blood-pressure reading, without diagnosis of hypertension: Secondary | ICD-10-CM | POA: Diagnosis not present

## 2022-12-12 ENCOUNTER — Other Ambulatory Visit: Payer: Self-pay | Admitting: Physician Assistant

## 2022-12-12 DIAGNOSIS — E039 Hypothyroidism, unspecified: Secondary | ICD-10-CM

## 2023-01-01 ENCOUNTER — Other Ambulatory Visit: Payer: Self-pay | Admitting: Physician Assistant

## 2023-01-01 DIAGNOSIS — G4709 Other insomnia: Secondary | ICD-10-CM

## 2023-03-12 ENCOUNTER — Other Ambulatory Visit: Payer: Self-pay | Admitting: Physician Assistant

## 2023-03-12 DIAGNOSIS — E039 Hypothyroidism, unspecified: Secondary | ICD-10-CM

## 2023-04-25 ENCOUNTER — Other Ambulatory Visit: Payer: Self-pay | Admitting: Physician Assistant

## 2023-04-25 DIAGNOSIS — M25531 Pain in right wrist: Secondary | ICD-10-CM | POA: Diagnosis not present

## 2023-04-25 DIAGNOSIS — M19031 Primary osteoarthritis, right wrist: Secondary | ICD-10-CM | POA: Diagnosis not present

## 2023-04-25 DIAGNOSIS — I700 Atherosclerosis of aorta: Secondary | ICD-10-CM | POA: Diagnosis not present

## 2023-04-25 DIAGNOSIS — Z5941 Food insecurity: Secondary | ICD-10-CM | POA: Diagnosis not present

## 2023-06-04 ENCOUNTER — Ambulatory Visit: Payer: Medicare HMO | Admitting: Physician Assistant

## 2023-06-04 ENCOUNTER — Encounter: Payer: Self-pay | Admitting: Physician Assistant

## 2023-06-04 VITALS — BP 130/70 | HR 83 | Temp 98.5°F | Resp 16 | Ht 62.0 in | Wt 142.0 lb

## 2023-06-04 DIAGNOSIS — E039 Hypothyroidism, unspecified: Secondary | ICD-10-CM | POA: Diagnosis not present

## 2023-06-04 DIAGNOSIS — I10 Essential (primary) hypertension: Secondary | ICD-10-CM

## 2023-06-04 DIAGNOSIS — G4709 Other insomnia: Secondary | ICD-10-CM | POA: Diagnosis not present

## 2023-06-04 DIAGNOSIS — Z1231 Encounter for screening mammogram for malignant neoplasm of breast: Secondary | ICD-10-CM | POA: Diagnosis not present

## 2023-06-04 DIAGNOSIS — E782 Mixed hyperlipidemia: Secondary | ICD-10-CM

## 2023-06-04 DIAGNOSIS — Z0000 Encounter for general adult medical examination without abnormal findings: Secondary | ICD-10-CM

## 2023-06-04 DIAGNOSIS — I700 Atherosclerosis of aorta: Secondary | ICD-10-CM | POA: Diagnosis not present

## 2023-06-04 DIAGNOSIS — R5383 Other fatigue: Secondary | ICD-10-CM

## 2023-06-04 MED ORDER — ALPRAZOLAM 0.25 MG PO TABS: ORAL_TABLET | ORAL | 1 refills | Status: DC

## 2023-06-04 MED ORDER — AMLODIPINE BESYLATE 5 MG PO TABS: 5.0000 mg | ORAL_TABLET | Freq: Every day | ORAL | 1 refills | Status: DC

## 2023-06-04 MED ORDER — ROSUVASTATIN CALCIUM 5 MG PO TABS: ORAL_TABLET | ORAL | 2 refills | Status: DC

## 2023-06-04 MED ORDER — LEVOTHYROXINE SODIUM 88 MCG PO TABS: 88.0000 ug | ORAL_TABLET | Freq: Every day | ORAL | 1 refills | Status: DC

## 2023-06-04 NOTE — Progress Notes (Signed)
 [facility] [address] [place], [place] [address]  Internal MEDICINE  Office Visit Note  Patient Name: Michelle [name]  [id number]  [id number]  Date of Service: 06/13/2023  Chief Complaint  Patient presents with   Medicare Wellness   Hypertension    HPI Elleanor presents for an annual well visit Well-appearing 73 y.o. female, reports she is doing well and has no concerns today Routine CRC screening: cologuard done in 2023 due Jan 2026 Routine mammogram: Due in April DEXA scan: Done in 2015, declines repeat at this time Labs: lab slip given -had both shingles vaccines--will call with dates -Prolapse surgery went well and now no incontinence or UTI symptoms. Very happy with results     06/04/2023    2:08 PM 05/29/2022    2:00 PM 05/13/2021   11:12 AM  MMSE - Mini Mental State Exam  Orientation to time 5 5 5   Orientation to Place 5 5 5   Registration 3 3 3   Attention/ Calculation 5 5 5   Recall 3 3 3   Language- name 2 objects 2 2 2   Language- repeat 1 1 1   Language- follow 3 step command 3 3 3   Language- read & follow direction 1 1 1   Write a sentence 1 1 1   Copy design 1 1 1   Total score 30 30 30     Functional Status Survey: Is the patient deaf or have difficulty hearing?: No Does the patient have difficulty seeing, even when wearing glasses/contacts?: No Does the patient have difficulty concentrating, remembering, or making decisions?: No Does the patient have difficulty walking or climbing stairs?: No Does the patient have difficulty dressing or bathing?: No Does the patient have difficulty doing errands alone such as visiting a doctor's office or shopping?: No     08/11/2021   11:52 AM 02/27/2022    9:49 AM 05/29/2022    2:02 PM 09/21/2022    1:21 PM 06/04/2023    2:08 PM  Fall Risk  Falls in the past year? 0 0 0 0 0  Was there an injury with Fall?  0     Fall Risk Category Calculator  0     Fall Risk Category (Retired)  Low     (RETIRED) Patient  Fall Risk Level  Low fall risk Low fall risk    Patient at Risk for Falls Due to  No Fall Risks No Fall Risks    Fall risk Follow up  Falls evaluation completed Falls evaluation completed         06/04/2023    2:08 PM  Depression screen PHQ 2/9  Decreased Interest 0  Down, Depressed, Hopeless 0  PHQ - 2 Score 0        No data to display            Current Medication: Outpatient Encounter Medications as of 06/04/2023  Medication Sig   aspirin  EC 81 MG tablet Take 81 mg by mouth daily.   estradiol  (ESTRACE  VAGINAL) 0.1 MG/GM vaginal cream Apply 0.5mg  (pea-sized amount)  just inside the vaginal introitus with a finger-tip on Monday, Wednesday and Friday nights.   [DISCONTINUED] ALPRAZolam  (XANAX ) 0.25 MG tablet TAKE 1 TABLET BY MOUTH EVERY DAY AT BEDTIME AS NEEDED FOR ANXIETY   [DISCONTINUED] amLODipine  (NORVASC ) 5 MG tablet Take 1 tablet (5 mg total) by mouth daily.   [DISCONTINUED] amoxicillin -clavulanate (AUGMENTIN ) 875-125 MG tablet Take 1 tablet by mouth 2 (two) times daily. Take with food.   [DISCONTINUED] estradiol  (ESTRACE ) 0.5  MG tablet Take 1 tablet (0.5 mg total) by mouth daily.   [DISCONTINUED] levothyroxine  (SYNTHROID ) 88 MCG tablet TAKE 1 TABLET BY MOUTH ONCE DAILY BEFORE BREAKFAST   [DISCONTINUED] nitrofurantoin , macrocrystal-monohydrate, (MACROBID ) 100 MG capsule Take 1 cap twice per day for 10 days.   [DISCONTINUED] rosuvastatin  (CRESTOR ) 5 MG tablet TAKE ONE TABLET BY MOUTH TWICE WEEKLY FOR ATHEROSCLEROSIS   ALPRAZolam  (XANAX ) 0.25 MG tablet TAKE 1 TABLET BY MOUTH EVERY DAY AT BEDTIME AS NEEDED FOR ANXIETY   amLODipine  (NORVASC ) 5 MG tablet Take 1 tablet (5 mg total) by mouth daily.   levothyroxine  (SYNTHROID ) 88 MCG tablet Take 1 tablet (88 mcg total) by mouth daily before breakfast.   rosuvastatin  (CRESTOR ) 5 MG tablet TAKE ONE TABLET BY MOUTH TWICE WEEKLY FOR ATHEROSCLEROSIS   No facility-administered encounter medications on file as of 06/04/2023.     Surgical History: Past Surgical History:  Procedure Laterality Date   child birth natural     diatation      Medical History: Past Medical History:  Diagnosis Date   Bladder incontinence    Hypertension    Hypothyroidism     Family History: Family History  Problem Relation Age of Onset   Cancer Father    Hyperlipidemia Father    Hypertension Father    Breast cancer Neg Hx     Social History   Socioeconomic History   Marital status: Married    Spouse name: Not on file   Number of children: Not on file   Years of education: Not on file   Highest education level: Not on file  Occupational History   Not on file  Tobacco Use   Smoking status: Former    Types: Cigarettes   Smokeless tobacco: Never  Substance and Sexual Activity   Alcohol use: No   Drug use: No   Sexual activity: Not Currently    Birth control/protection: Surgical, Post-menopausal    Comment: Hysterectomy  Other Topics Concern   Not on file  Social History Narrative   Not on file   Social Drivers of Health   Financial Resource Strain: Low Risk  (04/25/2023)   Received from [facility]   Overall Financial Resource Strain (CARDIA)    Difficulty of Paying Living Expenses: Not very hard  Food Insecurity: Food Insecurity Present (04/25/2023)   Received from [facility]   Hunger Vital Sign    Worried About Running Out of Food in the Last Year: Sometimes true    Ran Out of Food in the Last Year: Often true  Transportation Needs: No Transportation Needs (04/25/2023)   Received from [facility] - Transportation    In the past 12 months, has lack of transportation kept you from medical appointments or from getting medications?: No    Lack of Transportation (Non-Medical): No  Physical Activity: Not on file  Stress: Not on file  Social Connections: Not on file  Intimate Partner Violence: Not on file      Review of Systems   Constitutional:  Negative for chills, fatigue and unexpected weight change.  HENT:  Negative for congestion, postnasal drip, rhinorrhea, sneezing and sore throat.   Eyes:  Negative for redness.  Respiratory:  Negative for cough, chest tightness and shortness of breath.   Cardiovascular:  Negative for chest pain and palpitations.  Gastrointestinal:  Negative for abdominal pain, constipation, diarrhea, nausea and vomiting.  Genitourinary:  Negative for dysuria and frequency.  Musculoskeletal:  Negative  for back pain, joint swelling and neck pain.  Skin:  Negative for rash.  Neurological: Negative.  Negative for tremors and numbness.  Hematological:  Negative for adenopathy. Does not bruise/bleed easily.  Psychiatric/Behavioral:  Negative for behavioral problems (Depression), sleep disturbance and suicidal ideas. The patient is not nervous/anxious.     Vital Signs: BP 130/70 Comment: 141/70  Pulse 83   Temp 98.5 F (36.9 C)   Resp 16   Ht 5' 2 (1.575 m)   Wt 142 lb (64.4 kg)   SpO2 95%   BMI 25.97 kg/m    Physical Exam Vitals and nursing note reviewed.  Constitutional:      Appearance: Normal appearance.  HENT:     Head: Normocephalic and atraumatic.  Eyes:     Pupils: Pupils are equal, round, and reactive to light.  Cardiovascular:     Rate and Rhythm: Normal rate and regular rhythm.  Pulmonary:     Effort: Pulmonary effort is normal.     Breath sounds: Normal breath sounds.  Neurological:     General: No focal deficit present.     Mental Status: She is alert.  Psychiatric:        Mood and Affect: Mood normal.        Behavior: Behavior normal.        Assessment/Plan: 1. Encounter for Medicare annual wellness exam (Primary) Wellness visit performed, lab slip given, mammogram ordered.  Otherwise up-to-date on preventive health maintenance.  Patient will call with shingles vaccine dates  2. Essential hypertension Stable, continue current medication - amLODipine   (NORVASC ) 5 MG tablet; Take 1 tablet (5 mg total) by mouth daily.  Dispense: 90 tablet; Refill: 1  3. Other insomnia May continue Xanax  as needed - ALPRAZolam  (XANAX ) 0.25 MG tablet; TAKE 1 TABLET BY MOUTH EVERY DAY AT BEDTIME AS NEEDED FOR ANXIETY  Dispense: 45 tablet; Refill: 1  4. Acquired hypothyroidism May continue Synthroid  as before and will update labs - levothyroxine  (SYNTHROID ) 88 MCG tablet; Take 1 tablet (88 mcg total) by mouth daily before breakfast.  Dispense: 90 tablet; Refill: 1  5. Aortic atherosclerosis (HCC) Continue Crestor  and update labs - rosuvastatin  (CRESTOR ) 5 MG tablet; TAKE ONE TABLET BY MOUTH TWICE WEEKLY FOR ATHEROSCLEROSIS  Dispense: 24 tablet; Refill: 2  6. Visit for screening mammogram - MM 3D SCREENING MAMMOGRAM BILATERAL BREAST; Future     General Counseling: rubyann lingle understanding of the findings of todays visit and agrees with plan of treatment. I have discussed any further diagnostic evaluation that may be needed or ordered today. We also reviewed her medications today. she has been encouraged to call the office with any questions or concerns that should arise related to todays visit.    Orders Placed This Encounter  Procedures   MM 3D SCREENING MAMMOGRAM BILATERAL BREAST    Meds ordered this encounter  Medications   ALPRAZolam  (XANAX ) 0.25 MG tablet    Sig: TAKE 1 TABLET BY MOUTH EVERY DAY AT BEDTIME AS NEEDED FOR ANXIETY    Dispense:  45 tablet    Refill:  1   amLODipine  (NORVASC ) 5 MG tablet    Sig: Take 1 tablet (5 mg total) by mouth daily.    Dispense:  90 tablet    Refill:  1   levothyroxine  (SYNTHROID ) 88 MCG tablet    Sig: Take 1 tablet (88 mcg total) by mouth daily before breakfast.    Dispense:  90 tablet    Refill:  1   rosuvastatin  (  CRESTOR ) 5 MG tablet    Sig: TAKE ONE TABLET BY MOUTH TWICE WEEKLY FOR ATHEROSCLEROSIS    Dispense:  24 tablet    Refill:  2    Return in about 6 months (around 12/02/2023) for  general follow up.   Total time spent:35 Minutes Time spent includes review of chart, medications, test results, and follow up plan with the patient.   [place] Controlled Substance Database was reviewed by me.  This patient was seen by Tinnie Pro, PA-C in collaboration with Dr. Sigrid Bathe as a part of collaborative care agreement.  Tinnie Pro, PA-C Internal medicine

## 2023-06-11 DIAGNOSIS — J209 Acute bronchitis, unspecified: Secondary | ICD-10-CM | POA: Diagnosis not present

## 2023-06-11 DIAGNOSIS — R059 Cough, unspecified: Secondary | ICD-10-CM | POA: Diagnosis not present

## 2023-06-14 ENCOUNTER — Encounter: Payer: Self-pay | Admitting: Physician Assistant

## 2023-06-14 ENCOUNTER — Ambulatory Visit (INDEPENDENT_AMBULATORY_CARE_PROVIDER_SITE_OTHER): Payer: Medicare HMO | Admitting: Physician Assistant

## 2023-06-14 VITALS — BP 128/78 | HR 104 | Temp 98.8°F | Resp 16 | Ht 62.0 in | Wt 143.0 lb

## 2023-06-14 DIAGNOSIS — R051 Acute cough: Secondary | ICD-10-CM

## 2023-06-14 DIAGNOSIS — J209 Acute bronchitis, unspecified: Secondary | ICD-10-CM

## 2023-06-14 MED ORDER — AZITHROMYCIN 250 MG PO TABS: ORAL_TABLET | ORAL | 0 refills | Status: DC

## 2023-06-14 MED ORDER — PREDNISONE 10 MG PO TABS: ORAL_TABLET | ORAL | 0 refills | Status: DC

## 2023-06-14 NOTE — Progress Notes (Signed)
[facility] [address] [place], [place] [address]  Internal MEDICINE  Office Visit Note  Patient Name: Michelle [name]  [id number]  [id number]  Date of Service: 06/14/2023  Chief Complaint  Patient presents with   Acute Visit   Bronchitis    Was only given cough syrup and inhaler at urgent care, negative for strep/covid/flu     HPI Pt is here for a sick visit. -Started on Saturday -went to walk in clinic on 06/11/23 and had chest xray and told acute bronchitis and given inhaler and cough syrup but hasn't helped and feels worse now -negative for flu, covid at walk in as well  -low grade fever, some chills. Body aches -sinus pressure and headache -hoarseness, sore throat -no one else has been sick  Current Medication:  Outpatient Encounter Medications as of 06/14/2023  Medication Sig   ALPRAZolam (XANAX) 0.25 MG tablet TAKE 1 TABLET BY MOUTH EVERY DAY AT BEDTIME AS NEEDED FOR ANXIETY   amLODipine (NORVASC) 5 MG tablet Take 1 tablet (5 mg total) by mouth daily.   aspirin EC 81 MG tablet Take 81 mg by mouth daily.   azithromycin (ZITHROMAX) 250 MG tablet Take one tab a day for 10 days for uri   estradiol (ESTRACE VAGINAL) 0.1 MG/GM vaginal cream Apply 0.5mg  (pea-sized amount)  just inside the vaginal introitus with a finger-tip on Monday, Wednesday and Friday nights.   levothyroxine (SYNTHROID) 88 MCG tablet Take 1 tablet (88 mcg total) by mouth daily before breakfast.   predniSONE (DELTASONE) 10 MG tablet Take one tab 3 x day for 3 days, then take one tab 2 x a day for 3 days and then take one tab a day for 3 days   rosuvastatin (CRESTOR) 5 MG tablet TAKE ONE TABLET BY MOUTH TWICE WEEKLY FOR ATHEROSCLEROSIS   No facility-administered encounter medications on file as of 06/14/2023.      Medical History: Past Medical History:  Diagnosis Date   Bladder incontinence    Hypertension    Hypothyroidism      Vital Signs: BP 128/78   Pulse (!) 104   Temp  98.8 F (37.1 C)   Resp 16   Ht 5\' 2"  (1.575 m)   Wt 143 lb (64.9 kg)   SpO2 99%   BMI 26.16 kg/m    Review of Systems  Constitutional:  Positive for chills, fatigue and fever.  HENT:  Positive for congestion, sinus pressure, sore throat and voice change. Negative for mouth sores and postnasal drip.   Respiratory:  Positive for cough and wheezing.   Cardiovascular:  Negative for chest pain.  Gastrointestinal:  Negative for diarrhea, nausea and vomiting.  Genitourinary:  Negative for flank pain.  Musculoskeletal:  Positive for myalgias.  Psychiatric/Behavioral: Negative.      Physical Exam Vitals and nursing note reviewed.  Constitutional:      General: She is not in acute distress.    Appearance: Normal appearance. She is ill-appearing.  HENT:     Head: Normocephalic and atraumatic.     Nose: Congestion present.  Cardiovascular:     Rate and Rhythm: Regular rhythm. Tachycardia present.  Pulmonary:     Effort: Pulmonary effort is normal.     Breath sounds: Wheezing present.  Skin:    General: Skin is warm and dry.  Neurological:     Mental Status: She is alert.       Assessment/Plan: 1. Acute bronchitis, unspecified organism (Primary) Worsening symptoms, will send zpak and  prednisone for possible infection and to help reduce severity of symptoms. Advised to call or go to ED if new or worsening symptoms arise. Also advised to rest and stay well hydrated - predniSONE (DELTASONE) 10 MG tablet; Take one tab 3 x day for 3 days, then take one tab 2 x a day for 3 days and then take one tab a day for 3 days  Dispense: 18 tablet; Refill: 0 - azithromycin (ZITHROMAX) 250 MG tablet; Take one tab a day for 10 days for uri  Dispense: 10 tablet; Refill: 0  2. Acute cough May continue cough medication, will also send prednisone   General Counseling: Michelle [name] understanding of the findings of todays visit and agrees with plan of treatment. I have discussed any further  diagnostic evaluation that may be needed or ordered today. We also reviewed her medications today. she has been encouraged to call the office with any questions or concerns that should arise related to todays visit.    Counseling:    No orders of the defined types were placed in this encounter.   Meds ordered this encounter  Medications   predniSONE (DELTASONE) 10 MG tablet    Sig: Take one tab 3 x day for 3 days, then take one tab 2 x a day for 3 days and then take one tab a day for 3 days    Dispense:  18 tablet    Refill:  0   azithromycin (ZITHROMAX) 250 MG tablet    Sig: Take one tab a day for 10 days for uri    Dispense:  10 tablet    Refill:  0    Time spent:25 Minutes

## 2023-06-21 ENCOUNTER — Telehealth: Payer: Self-pay

## 2023-06-22 ENCOUNTER — Other Ambulatory Visit: Payer: Self-pay | Admitting: Physician Assistant

## 2023-06-22 MED ORDER — LEVOFLOXACIN 500 MG PO TABS: 500.0000 mg | ORAL_TABLET | Freq: Every day | ORAL | 0 refills | Status: AC

## 2023-06-22 NOTE — Telephone Encounter (Signed)
 Patient notified

## 2023-07-02 ENCOUNTER — Other Ambulatory Visit: Payer: Self-pay | Admitting: Physician Assistant

## 2023-07-02 DIAGNOSIS — Z0001 Encounter for general adult medical examination with abnormal findings: Secondary | ICD-10-CM | POA: Diagnosis not present

## 2023-07-02 DIAGNOSIS — E039 Hypothyroidism, unspecified: Secondary | ICD-10-CM | POA: Diagnosis not present

## 2023-07-02 DIAGNOSIS — I700 Atherosclerosis of aorta: Secondary | ICD-10-CM | POA: Diagnosis not present

## 2023-07-02 DIAGNOSIS — E782 Mixed hyperlipidemia: Secondary | ICD-10-CM | POA: Diagnosis not present

## 2023-07-02 DIAGNOSIS — R5383 Other fatigue: Secondary | ICD-10-CM | POA: Diagnosis not present

## 2023-07-03 ENCOUNTER — Encounter: Payer: Self-pay | Admitting: Internal Medicine

## 2023-07-03 ENCOUNTER — Ambulatory Visit (INDEPENDENT_AMBULATORY_CARE_PROVIDER_SITE_OTHER): Payer: Medicare HMO | Admitting: Internal Medicine

## 2023-07-03 ENCOUNTER — Telehealth: Payer: Self-pay

## 2023-07-03 VITALS — BP 125/80 | HR 81 | Temp 98.1°F | Resp 16 | Ht 62.0 in | Wt 143.0 lb

## 2023-07-03 DIAGNOSIS — I10 Essential (primary) hypertension: Secondary | ICD-10-CM

## 2023-07-03 DIAGNOSIS — R300 Dysuria: Secondary | ICD-10-CM | POA: Diagnosis not present

## 2023-07-03 DIAGNOSIS — N390 Urinary tract infection, site not specified: Secondary | ICD-10-CM

## 2023-07-03 DIAGNOSIS — M549 Dorsalgia, unspecified: Secondary | ICD-10-CM

## 2023-07-03 LAB — COMPREHENSIVE METABOLIC PANEL
ALT: 23 [IU]/L (ref 0–32)
AST: 21 [IU]/L (ref 0–40)
Albumin: 4 g/dL (ref 3.8–4.8)
Alkaline Phosphatase: 105 [IU]/L (ref 44–121)
BUN/Creatinine Ratio: 16 (ref 12–28)
BUN: 13 mg/dL (ref 8–27)
Bilirubin Total: 0.5 mg/dL (ref 0.0–1.2)
CO2: 25 mmol/L (ref 20–29)
Calcium: 9.5 mg/dL (ref 8.7–10.3)
Chloride: 103 mmol/L (ref 96–106)
Creatinine, Ser: 0.81 mg/dL (ref 0.57–1.00)
Globulin, Total: 2.7 g/dL (ref 1.5–4.5)
Glucose: 97 mg/dL (ref 70–99)
Potassium: 3.7 mmol/L (ref 3.5–5.2)
Sodium: 141 mmol/L (ref 134–144)
Total Protein: 6.7 g/dL (ref 6.0–8.5)
eGFR: 76 mL/min/{1.73_m2} (ref >59)

## 2023-07-03 LAB — CBC WITH DIFFERENTIAL/PLATELET
Basophils Absolute: 0 10*3/uL (ref 0.0–0.2)
Basos: 0 %
EOS (ABSOLUTE): 0.1 10*3/uL (ref 0.0–0.4)
Eos: 1 %
Hematocrit: 41.4 % (ref 34.0–46.6)
Hemoglobin: 13.5 g/dL (ref 11.1–15.9)
Immature Grans (Abs): 0 10*3/uL (ref 0.0–0.1)
Immature Granulocytes: 0 %
Lymphocytes Absolute: 1.8 10*3/uL (ref 0.7–3.1)
Lymphs: 17 %
MCH: 28.1 pg (ref 26.6–33.0)
MCHC: 32.6 g/dL (ref 31.5–35.7)
MCV: 86 fL (ref 79–97)
Monocytes Absolute: 0.7 10*3/uL (ref 0.1–0.9)
Monocytes: 7 %
Neutrophils Absolute: 7.6 10*3/uL — ABNORMAL HIGH (ref 1.4–7.0)
Neutrophils: 75 %
Platelets: 207 10*3/uL (ref 150–450)
RBC: 4.8 x10E6/uL (ref 3.77–5.28)
RDW: 14.1 % (ref 11.7–15.4)
WBC: 10.3 10*3/uL (ref 3.4–10.8)

## 2023-07-03 LAB — POCT URINALYSIS DIPSTICK
Bilirubin, UA: NEGATIVE
Glucose, UA: NEGATIVE
Ketones, UA: NEGATIVE
Nitrite, UA: POSITIVE
Protein, UA: NEGATIVE
Spec Grav, UA: 1.01 (ref 1.010–1.025)
Urobilinogen, UA: 0.2 U/dL
pH, UA: 5 (ref 5.0–8.0)

## 2023-07-03 LAB — LIPID PANEL WITH LDL/HDL RATIO
Cholesterol, Total: 146 mg/dL (ref 100–199)
HDL: 45 mg/dL (ref >39)
LDL Chol Calc (NIH): 82 mg/dL (ref 0–99)
LDL/HDL Ratio: 1.8 {ratio} (ref 0.0–3.2)
Triglycerides: 104 mg/dL (ref 0–149)
VLDL Cholesterol Cal: 19 mg/dL (ref 5–40)

## 2023-07-03 LAB — T4, FREE: Free T4: 1.23 ng/dL (ref 0.82–1.77)

## 2023-07-03 LAB — TSH: TSH: 3.55 u[IU]/mL (ref 0.450–4.500)

## 2023-07-03 MED ORDER — AMOXICILLIN-POT CLAVULANATE 875-125 MG PO TABS
1.0000 | ORAL_TABLET | Freq: Two times a day (BID) | ORAL | 0 refills | Status: DC
Start: 2023-07-03 — End: 2023-08-14

## 2023-07-03 NOTE — Telephone Encounter (Signed)
Lmom that I add her on schedule please come in ASAP

## 2023-07-03 NOTE — Progress Notes (Signed)
 [facility] [address] [place], [place] [address]  Internal MEDICINE  Office Visit Note  Patient Name: Michelle [name]  [id number]  [id number]  Date of Service: 07/17/2023  Chief Complaint  Patient presents with   Acute Visit   Urinary Tract Infection    Lower back pain, urine frequency    HPI Pt is seen for acute visit  Started to have lower back pain, thinks it might be UTI, as she felt the same before  Feeling tired as well Denies any fever or chills     Current Medication: Outpatient Encounter Medications as of 07/03/2023  Medication Sig   ALPRAZolam (XANAX) 0.25 MG tablet TAKE 1 TABLET BY MOUTH EVERY DAY AT BEDTIME AS NEEDED FOR ANXIETY   amLODipine (NORVASC) 5 MG tablet Take 1 tablet (5 mg total) by mouth daily.   amoxicillin-clavulanate (AUGMENTIN) 875-125 MG tablet Take 1 tablet by mouth 2 (two) times daily.   aspirin EC 81 MG tablet Take 81 mg by mouth daily.   azithromycin (ZITHROMAX) 250 MG tablet Take one tab a day for 10 days for uri   estradiol (ESTRACE VAGINAL) 0.1 MG/GM vaginal cream Apply 0.5mg  (pea-sized amount)  just inside the vaginal introitus with a finger-tip on Monday, Wednesday and Friday nights.   levothyroxine (SYNTHROID) 88 MCG tablet Take 1 tablet (88 mcg total) by mouth daily before breakfast.   predniSONE (DELTASONE) 10 MG tablet Take one tab 3 x day for 3 days, then take one tab 2 x a day for 3 days and then take one tab a day for 3 days   rosuvastatin (CRESTOR) 5 MG tablet TAKE ONE TABLET BY MOUTH TWICE WEEKLY FOR ATHEROSCLEROSIS   No facility-administered encounter medications on file as of 07/03/2023.    Surgical History: Past Surgical History:  Procedure Laterality Date   child birth natural     diatation      Medical History: Past Medical History:  Diagnosis Date   Bladder incontinence    Hypertension    Hypothyroidism     Family History: Family History  Problem Relation Age of Onset   Cancer Father     Hyperlipidemia Father    Hypertension Father    Breast cancer Neg Hx     Social History   Socioeconomic History   Marital status: Married    Spouse name: Not on file   Number of children: Not on file   Years of education: Not on file   Highest education level: Not on file  Occupational History   Not on file  Tobacco Use   Smoking status: Former    Types: Cigarettes   Smokeless tobacco: Never  Substance and Sexual Activity   Alcohol use: No   Drug use: No   Sexual activity: Not Currently    Birth control/protection: Surgical, Post-menopausal    Comment: Hysterectomy  Other Topics Concern   Not on file  Social History Narrative   Not on file   Social Drivers of Health   Financial Resource Strain: Low Risk  (04/25/2023)   Received from [facility]   Overall Financial Resource Strain (CARDIA)    Difficulty of Paying Living Expenses: Not very hard  Food Insecurity: Food Insecurity Present (04/25/2023)   Received from [facility]   Hunger Vital Sign    Worried About Running Out of Food in the Last Year: Sometimes true    Ran Out of Food in the Last Year: Often true  Transportation Needs:  No Transportation Needs (04/25/2023)   Received from [facility]    In the past 12 months, has lack of transportation kept you from medical appointments or from getting medications?: No    Lack of Transportation (Non-Medical): No  Physical Activity: Not on file  Stress: Not on file  Social Connections: Not on file  Intimate Partner Violence: Not on file      Review of Systems  Constitutional:  Negative for fatigue and fever.  HENT:  Negative for congestion, mouth sores and postnasal drip.   Respiratory:  Negative for cough.   Cardiovascular:  Negative for chest pain.  Genitourinary:  Positive for dysuria and flank pain.  Psychiatric/Behavioral: Negative.      Vital Signs: BP 125/80   Pulse 81    Temp 98.1 F (36.7 C)   Resp 16   Ht 5\' 2"  (1.575 m)   Wt 143 lb (64.9 kg)   SpO2 98%   BMI 26.16 kg/m    Physical Exam Constitutional:      Appearance: Normal appearance.  HENT:     Head: Normocephalic and atraumatic.     Nose: Nose normal.     Mouth/Throat:     Mouth: Mucous membranes are moist.     Pharynx: No posterior oropharyngeal erythema.  Eyes:     Extraocular Movements: Extraocular movements intact.     Pupils: Pupils are equal, round, and reactive to light.  Cardiovascular:     Pulses: Normal pulses.     Heart sounds: Normal heart sounds.  Pulmonary:     Effort: Pulmonary effort is normal.     Breath sounds: Normal breath sounds.  Neurological:     General: No focal deficit present.     Mental Status: She is alert.  Psychiatric:        Mood and Affect: Mood normal.        Behavior: Behavior normal.        Assessment/Plan: 1. Urinary tract infection without hematuria, site unspecified (Primary) Urine dipstick is abnormal, will send urine for C/S, reviewed previous urine c/s, will prescribe abx accordingly  - CULTURE, URINE COMPREHENSIVE  2. Back pain, subacute Pt is instructed take Tylenol and use heating bad for comfort  - POCT Urinalysis Dipstick  3. Essential hypertension Controlled with meds    General Counseling: roise emert understanding of the findings of todays visit and agrees with plan of treatment. I have discussed any further diagnostic evaluation that may be needed or ordered today. We also reviewed her medications today. she has been encouraged to call the office with any questions or concerns that should arise related to todays visit.    Orders Placed This Encounter  Procedures   CULTURE, URINE COMPREHENSIVE   POCT Urinalysis Dipstick    Meds ordered this encounter  Medications   amoxicillin-clavulanate (AUGMENTIN) 875-125 MG tablet    Sig: Take 1 tablet by mouth 2 (two) times daily.    Dispense:  20 tablet    Refill:   0    Total time spent:30 Minutes Time spent includes review of chart, medications, test results, and follow up plan with the patient.   [place] Controlled Substance Database was reviewed by me.   Dr [name] Internal medicine

## 2023-07-03 NOTE — Telephone Encounter (Signed)
Spoke with patient regarding lab results.

## 2023-07-03 NOTE — Telephone Encounter (Signed)
-----   Message from Carlean Jews sent at 07/03/2023  8:50 AM EST ----- Please let her know that overall her labs look good. One of her WBC was just a little elevated and is likely due to recent illness.

## 2023-07-06 LAB — CULTURE, URINE COMPREHENSIVE

## 2023-07-10 ENCOUNTER — Telehealth: Payer: Self-pay

## 2023-07-10 NOTE — Telephone Encounter (Signed)
 Patient notified

## 2023-07-10 NOTE — Telephone Encounter (Signed)
-----   Message from [facility] sent at 07/10/2023  2:39 PM EST ----- Pt is on appropriate abx for UTI, finish entire dose ( FYI)

## 2023-07-30 DIAGNOSIS — Z6824 Body mass index (BMI) 24.0-24.9, adult: Secondary | ICD-10-CM | POA: Diagnosis not present

## 2023-07-30 DIAGNOSIS — R351 Nocturia: Secondary | ICD-10-CM | POA: Diagnosis not present

## 2023-07-30 DIAGNOSIS — N8111 Cystocele, midline: Secondary | ICD-10-CM | POA: Diagnosis not present

## 2023-08-14 ENCOUNTER — Telehealth: Payer: Self-pay

## 2023-08-14 ENCOUNTER — Other Ambulatory Visit: Payer: Self-pay

## 2023-08-14 MED ORDER — NITROFURANTOIN MONOHYD MACRO 100 MG PO CAPS: 100.0000 mg | ORAL_CAPSULE | Freq: Two times a day (BID) | ORAL | 0 refills | Status: DC

## 2023-08-14 NOTE — Telephone Encounter (Signed)
 As per lauren sent macrobid for 7 days and advised if she is not feeling better need appt

## 2023-09-20 ENCOUNTER — Ambulatory Visit
Admission: RE | Admit: 2023-09-20 | Discharge: 2023-09-20 | Disposition: A | Discharge status: Home / Self Care | Source: Ambulatory Visit | Attending: Physician Assistant | Admitting: Physician Assistant

## 2023-09-20 DIAGNOSIS — Z1231 Encounter for screening mammogram for malignant neoplasm of breast: Secondary | ICD-10-CM | POA: Diagnosis not present

## 2023-09-21 ENCOUNTER — Ambulatory Visit: Admitting: Urology

## 2023-09-21 VITALS — BP 120/78 | HR 78 | Ht 62.0 in | Wt 140.4 lb

## 2023-09-21 DIAGNOSIS — N3946 Mixed incontinence: Secondary | ICD-10-CM

## 2023-09-21 DIAGNOSIS — N952 Postmenopausal atrophic vaginitis: Secondary | ICD-10-CM

## 2023-09-21 LAB — URINALYSIS, COMPLETE
Bilirubin, UA: NEGATIVE
Glucose, UA: NEGATIVE
Ketones, UA: NEGATIVE
Nitrite, UA: NEGATIVE
Protein,UA: NEGATIVE
Specific Gravity, UA: <1.005 — ABNORMAL LOW (ref 1.005–1.030)
Urobilinogen, Ur: 0.2 mg/dL (ref 0.2–1.0)
pH, UA: 6 (ref 5.0–7.5)

## 2023-09-21 LAB — MICROSCOPIC EXAMINATION
Bacteria, UA: NONE SEEN
WBC, UA: NONE SEEN /HPF (ref 0–5)

## 2023-09-21 LAB — BLADDER SCAN AMB NON-IMAGING: Scan Result: 0

## 2023-09-21 MED ORDER — OXYBUTYNIN CHLORIDE ER 15 MG PO TB24
15.0000 mg | ORAL_TABLET | Freq: Every day | ORAL | 0 refills | Status: DC
Start: 2023-09-21 — End: 2023-11-13

## 2023-09-21 NOTE — Progress Notes (Unsigned)
 09/21/2023 9:08 AM   Michelle [name] February 08, 1951 [id number]  Referring provider: Jacques Mattock, PA-C [address] [place],  [place] [address]  Urological history: 1.     Chief Complaint  Patient presents with   Over Active Bladder    HPI: Michelle [name] is a 73 y.o. *** who presents today for ****  Previous records reviewed.   PMH: Past Medical History:  Diagnosis Date   Bladder incontinence    Hypertension    Hypothyroidism     Surgical History: Past Surgical History:  Procedure Laterality Date   child birth natural     diatation      Home Medications:  Allergies as of 09/21/2023       Reactions   Sulfa Antibiotics Hives, Nausea And Vomiting        Medication List        Accurate as of Sep 21, 2023  9:08 AM. If you have any questions, ask your nurse or doctor.          STOP taking these medications    nitrofurantoin  (macrocrystal-monohydrate) 100 MG capsule Commonly known as: MACROBID  Stopped by: Michelle [name]       TAKE these medications    ALPRAZolam  0.25 MG tablet Commonly known as: XANAX  TAKE 1 TABLET BY MOUTH EVERY DAY AT BEDTIME AS NEEDED FOR ANXIETY   amLODipine  5 MG tablet Commonly known as: NORVASC  Take 1 tablet (5 mg total) by mouth daily.   aspirin  EC 81 MG tablet Take 81 mg by mouth daily.   estradiol  0.1 MG/GM vaginal cream Commonly known as: ESTRACE  VAGINAL Apply 0.5mg  (pea-sized amount)  just inside the vaginal introitus with a finger-tip on Monday, Wednesday and Friday nights.   levothyroxine  88 MCG tablet Commonly known as: SYNTHROID  Take 1 tablet (88 mcg total) by mouth daily before breakfast.   rosuvastatin  5 MG tablet Commonly known as: CRESTOR  TAKE ONE TABLET BY MOUTH TWICE WEEKLY FOR ATHEROSCLEROSIS        Allergies:  Allergies  Allergen Reactions   Sulfa Antibiotics Hives and Nausea And Vomiting    Family History: Family History  Problem Relation Age of Onset   Cancer Father     Hyperlipidemia Father    Hypertension Father    Breast cancer Neg Hx     Social History:  reports that she has quit smoking. Her smoking use included cigarettes. She has never used smokeless tobacco. She reports that she does not drink alcohol and does not use drugs.  ROS: Pertinent ROS in HPI  Physical Exam: BP 120/78   Pulse 78   Ht 5\' 2"  (1.575 m)   Wt 140 lb 6 oz (63.7 kg)   BMI 25.67 kg/m   Constitutional:  Well nourished. Alert and oriented, No acute distress. HEENT: [place] AT, moist mucus membranes.  Trachea midline, no masses. Cardiovascular: No clubbing, cyanosis, or edema. Respiratory: Normal respiratory effort, no increased work of breathing. GU: No CVA tenderness.  No bladder fullness or masses.  Recession of labia minora, dry, pale vulvar vaginal mucosa and loss of mucosal ridges and folds.  Normal urethral meatus, no lesions, no prolapse, no discharge.   No urethral masses, tenderness and/or tenderness. No bladder fullness, tenderness or masses. *** vagina mucosa, *** estrogen effect, no discharge, no lesions, *** pelvic support, *** cystocele and *** rectocele noted.  No cervical motion tenderness.  Uterus is freely mobile and non-fixed.  No adnexal/parametria masses or tenderness noted.  Anus and perineum are without  rashes or lesions.   ***  Neurologic: Grossly intact, no focal deficits, moving all 4 extremities. Psychiatric: Normal mood and affect.    Laboratory Data: Lab Results  Component Value Date   WBC 10.3 07/02/2023   HGB 13.5 07/02/2023   HCT 41.4 07/02/2023   MCV 86 07/02/2023   PLT 207 07/02/2023    Lab Results  Component Value Date   CREATININE 0.81 07/02/2023    No results found for: "PSA"  No results found for: "TESTOSTERONE"  No results found for: "HGBA1C"  Lab Results  Component Value Date   TSH 3.550 07/02/2023       Component Value Date/Time   CHOL 146 07/02/2023 0939   HDL 45 07/02/2023 0939   LDLCALC 82 07/02/2023 0939    Lab  Results  Component Value Date   AST 21 07/02/2023   Lab Results  Component Value Date   ALT 23 07/02/2023   No components found for: "ALKALINEPHOPHATASE" No components found for: "BILIRUBINTOTAL"  No results found for: "ESTRADIOL "  Urinalysis See EPIC and HPI  I have reviewed the labs.   Pertinent Imaging: @CT @ @ultrasound @ @KUB @ I have independently reviewed the films.    Assessment & Plan:  ***  1. 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  These notes generated with voice recognition software. I apologize for typographical errors.  Briant Camper  [facility] [address]  [address] [place], [place] [address] [phone]

## 2023-09-24 ENCOUNTER — Encounter: Payer: Self-pay | Admitting: Urology

## 2023-09-28 ENCOUNTER — Other Ambulatory Visit: Payer: Self-pay

## 2023-09-28 DIAGNOSIS — N3946 Mixed incontinence: Secondary | ICD-10-CM

## 2023-09-28 NOTE — Telephone Encounter (Signed)
 Contacted [employer] for a PA.  Advised they will sent a form and given a key   There is an existing case within the [facility] environment that has the same patient, prescriber, and drug. This case must be finalized before proceeding with similar requests.--error message  Will follow up once form is received.

## 2023-09-28 NOTE — Telephone Encounter (Signed)
-----   Message from [facility] M sent at 09/28/2023  2:46 PM EDT -----  ----- Message ----- From: Donold Galla Sent: 09/28/2023  11:50 AM EDT To: Katina Parlor, CMA

## 2023-10-02 NOTE — Telephone Encounter (Signed)
 Ref- [id number]  Clinical questions answered. Approved. May take up to 72h for fax to be sent.

## 2023-10-02 NOTE — Telephone Encounter (Signed)
-----   Message from [facility] M sent at 09/28/2023  2:46 PM EDT -----  ----- Message ----- From: Donold Galla Sent: 09/28/2023  11:50 AM EDT To: Katina Parlor, CMA

## 2023-11-02 ENCOUNTER — Ambulatory Visit: Admitting: Urology

## 2023-11-06 ENCOUNTER — Ambulatory Visit: Admitting: Urology

## 2023-11-12 NOTE — Progress Notes (Signed)
 11/13/2023 11:33 AM   Michelle [name] 01-27-1951 [id number]  Referring provider: Kristina Tinnie POUR, PA-C [address] [place],  [place] [address]  Urological history: 1.  Right hydronephrosis - CT (10/2019) - Chronic hydroureteronephrosis on the right without evidence of an obstructing stone. Findings appear quite similar to the study of 2014 and presumably relate to a distal ureteral structure. Patient has a degree of bladder prolapse which could possibly be affecting the UVJ. -Renal Lasix  scan (10/2019) - No findings of high-grade obstructive uropathy bilaterally.  Delayed clearance of the radiopharmaceutical from dilated right renal collecting system which washes out following Lasix  administration.  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 rUTI's - July 03, 2023, E. Coli - December 04, 2022, E. Coli - Vaginal estrogen cream  3.  Mixed urinary incontinence - RA-sacrocolpopexy, RSO, retropubic mid urethral sling, posterior repair and cystoscopy (10/2022)   Chief Complaint  Patient presents with   Urinary Incontinence   HPI: Michelle [name] is a 73 y.o. woman who presents today for 6 weeks follow up for OAB questionnaire and PVR after having a trial of oxybutynin  XL 15 mg daily.  Previous records reviewed.   At her visit on 09/21/2023,sShe underwent a sacrocolpopexy in June 2024.   She is doing well until she developed URI in the early spring when she felt that something had come down.   She was having urinary frequency.  She was seen by Dr. Jina Urogynecology in [place], who performs surgery and exam did not indicate any mesh erosion or prolapse recurrence.  She was given a prescription for Gemtesa  75 mg daily, but that was cost prohibitive.  She called back and they started on Vesicare 5  mg and she has been on it for 2-1/2 weeks and she states it is not doing anything.  She presents now for further options.   She is having daytime frequency, but she cannot specify the amount that she is having nocturia x 5.  She is having a strong urge to urinate.  She is having leakage with coughing, laughing and sneezing.  She is not wearing any absorbent products.  She is not limiting her fluid intake.  She does engage in toilet mapping.  Patient denies any modifying or aggravating factors.  Patient denies any recent UTI's, gross hematuria, dysuria or suprapubic/flank pain.  Patient denies any fevers, chills, nausea or vomiting.   Her UA is yellow clear, specific gravity less than 1.005, pH 6.0, trace heme, trace leuks, 0-2 RBCs and 0-10 epithelial cells.  PVR 0 mL.  She also has been experiencing hot flashes.   She was going to speak to her PCP regarding her hot flashes.  She was started on oxybutynin  XL 15 mg daily for her urinary issues and increased her vaginal estrogen cream applications to three nights weekly.    She is having 1-7 daytime voids, 3 or more episodes of nocturia with a strong urge to urinate.  She is not having urinary leakage.  She does limit fluid intake and she does engage in toilet mapping.  Patient denies any modifying or aggravating factors.  Patient denies any recent UTI's, gross hematuria, dysuria or suprapubic/flank pain.  Patient denies any fevers, chills, nausea or vomiting.    She states the oxybutynin  did help specifically  with incontinence, but it was intolerable to constipation.  She took 2 doses of milk of magnesia and that did not ease the constipation.  PVR 71 mL   PMH: Past Medical History:  Diagnosis Date   Bladder incontinence    Hypertension    Hypothyroidism     Surgical History: Past Surgical History:  Procedure Laterality Date   child birth natural     diatation      Home Medications:  Allergies as of 11/13/2023       Reactions   Sulfa  Antibiotics Hives, Nausea And Vomiting        Medication List        Accurate as of November 13, 2023 11:59 PM. If you have any questions, ask your nurse or doctor.          STOP taking these medications    oxybutynin  15 MG 24 hr tablet Commonly known as: DITROPAN  XL       TAKE these medications    ALPRAZolam  0.25 MG tablet Commonly known as: XANAX  TAKE 1 TABLET BY MOUTH EVERY DAY AT BEDTIME AS NEEDED FOR ANXIETY   amLODipine  5 MG tablet Commonly known as: NORVASC  Take 1 tablet (5 mg total) by mouth daily.   aspirin  EC 81 MG tablet Take 81 mg by mouth daily.   estradiol  0.1 MG/GM vaginal cream Commonly known as: ESTRACE  VAGINAL Apply 0.5mg  (pea-sized amount)  just inside the vaginal introitus with a finger-tip on Monday, Wednesday and Friday nights.   Gemtesa  75 MG Tabs Generic drug: Vibegron  Take 1 tablet (75 mg total) by mouth daily.   levothyroxine  88 MCG tablet Commonly known as: SYNTHROID  Take 1 tablet (88 mcg total) by mouth daily before breakfast.   rosuvastatin  5 MG tablet Commonly known as: CRESTOR  TAKE ONE TABLET BY MOUTH TWICE WEEKLY FOR ATHEROSCLEROSIS        Allergies:  Allergies  Allergen Reactions   Sulfa Antibiotics Hives and Nausea And Vomiting    Family History: Family History  Problem Relation Age of Onset   Cancer Father    Hyperlipidemia Father    Hypertension Father    Breast cancer Neg Hx     Social History:  reports that she has quit smoking. Her smoking use included cigarettes. She has never used smokeless tobacco. She reports that she does not drink alcohol and does not use drugs.  ROS: Pertinent ROS in HPI  Physical Exam: BP 130/83   Pulse 87   Ht 5' 2 (1.575 m)   Wt 141 lb (64 kg)   BMI 25.79 kg/m   Constitutional:  Well nourished. Alert and oriented, No acute distress. HEENT: [place] AT, moist mucus membranes.  Trachea midline Cardiovascular: No clubbing, cyanosis, or edema. Respiratory: Normal respiratory  effort, no increased work of breathing. Neurologic: Grossly intact, no focal deficits, moving all 4 extremities. Psychiatric: Normal mood and affect.    Laboratory Data: N/A  Pertinent Imaging:  11/13/23 10:11  Scan Result 71ml    Assessment & Plan:    1. 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 GSM - continue vaginal estrogen cream to 3 nights weekly although this will likely not help with the hot flashes, but it may help with the OAB - She  will reach out to her PCP regarding further workup regarding her hot flashes  Return in about 1 month (around 12/13/2023) for PVR and OAB questionnaire.  These notes generated with voice recognition software. I apologize for typographical errors.  Michelle [name]  [facility] [address]  [address] [place], [place] [address] [phone]

## 2023-11-13 ENCOUNTER — Ambulatory Visit (INDEPENDENT_AMBULATORY_CARE_PROVIDER_SITE_OTHER): Admitting: Urology

## 2023-11-13 VITALS — BP 130/83 | HR 87 | Ht 62.0 in | Wt 141.0 lb

## 2023-11-13 DIAGNOSIS — N952 Postmenopausal atrophic vaginitis: Secondary | ICD-10-CM

## 2023-11-13 DIAGNOSIS — N3946 Mixed incontinence: Secondary | ICD-10-CM | POA: Diagnosis not present

## 2023-11-13 DIAGNOSIS — N3281 Overactive bladder: Secondary | ICD-10-CM

## 2023-11-13 LAB — BLADDER SCAN AMB NON-IMAGING

## 2023-11-13 MED ORDER — GEMTESA 75 MG PO TABS: 75.0000 mg | ORAL_TABLET | Freq: Every day | ORAL | Status: DC

## 2023-11-15 ENCOUNTER — Telehealth: Payer: Self-pay | Admitting: Urology

## 2023-11-15 NOTE — Telephone Encounter (Signed)
 Patient called and lvm stating that she believes she has a UTI; she is having symptoms of frequency, lower back pain, and pressure. She was seen by Clotilda on 6/24, but did not have symptoms at that time. She is currently in [place]  visiting her daughter. She is asking if something can be called in to pharmacy at [facility] in [place], [place] . Please advise patient. Her number is [phone].

## 2023-11-16 ENCOUNTER — Encounter: Payer: Self-pay | Admitting: Urology

## 2023-11-20 NOTE — Telephone Encounter (Signed)
 Pt aware.   Pt seen in at the UC in [place].   Pt states she is on Macrobid . She will complete in a few days. She is feeling better.

## 2023-12-03 ENCOUNTER — Ambulatory Visit (INDEPENDENT_AMBULATORY_CARE_PROVIDER_SITE_OTHER): Payer: Medicare HMO | Admitting: Physician Assistant

## 2023-12-03 ENCOUNTER — Encounter: Payer: Self-pay | Admitting: Physician Assistant

## 2023-12-03 VITALS — BP 143/82 | HR 79 | Temp 98.5°F | Resp 16 | Ht 62.0 in | Wt 143.8 lb

## 2023-12-03 DIAGNOSIS — R319 Hematuria, unspecified: Secondary | ICD-10-CM | POA: Diagnosis not present

## 2023-12-03 DIAGNOSIS — I10 Essential (primary) hypertension: Secondary | ICD-10-CM

## 2023-12-03 DIAGNOSIS — G4709 Other insomnia: Secondary | ICD-10-CM

## 2023-12-03 DIAGNOSIS — R61 Generalized hyperhidrosis: Secondary | ICD-10-CM | POA: Diagnosis not present

## 2023-12-03 DIAGNOSIS — N390 Urinary tract infection, site not specified: Secondary | ICD-10-CM | POA: Diagnosis not present

## 2023-12-03 DIAGNOSIS — I700 Atherosclerosis of aorta: Secondary | ICD-10-CM | POA: Diagnosis not present

## 2023-12-03 DIAGNOSIS — E039 Hypothyroidism, unspecified: Secondary | ICD-10-CM | POA: Diagnosis not present

## 2023-12-03 DIAGNOSIS — R300 Dysuria: Secondary | ICD-10-CM | POA: Diagnosis not present

## 2023-12-03 LAB — POCT URINALYSIS DIPSTICK
Bilirubin, UA: NEGATIVE
Blood, UA: POSITIVE
Glucose, UA: NEGATIVE
Ketones, UA: POSITIVE
Nitrite, UA: NEGATIVE
Protein, UA: POSITIVE — AB
Spec Grav, UA: 1.01 (ref 1.010–1.025)
Urobilinogen, UA: 0.2 U/dL
pH, UA: 5 (ref 5.0–8.0)

## 2023-12-03 MED ORDER — ALPRAZOLAM 0.25 MG PO TABS: ORAL_TABLET | ORAL | 1 refills | Status: DC

## 2023-12-03 MED ORDER — AMOXICILLIN-POT CLAVULANATE 875-125 MG PO TABS: 1.0000 | ORAL_TABLET | Freq: Two times a day (BID) | ORAL | 0 refills | Status: DC

## 2023-12-03 MED ORDER — ROSUVASTATIN CALCIUM 5 MG PO TABS: ORAL_TABLET | ORAL | 2 refills | Status: AC

## 2023-12-03 MED ORDER — AMLODIPINE BESYLATE 5 MG PO TABS
5.0000 mg | ORAL_TABLET | Freq: Every day | ORAL | 1 refills | Status: AC
Start: 2023-12-03 — End: ?

## 2023-12-03 MED ORDER — LEVOTHYROXINE SODIUM 88 MCG PO TABS: 88.0000 ug | ORAL_TABLET | Freq: Every day | ORAL | 1 refills | Status: DC

## 2023-12-03 MED ORDER — VENLAFAXINE HCL ER 37.5 MG PO CP24: 37.5000 mg | ORAL_CAPSULE | Freq: Every day | ORAL | 2 refills | Status: DC

## 2023-12-03 NOTE — Progress Notes (Signed)
 [facility] [address] [place], [place] [address]  Internal MEDICINE  Office Visit Note  Patient Name: Michelle [name]  [id number]  [id number]  Date of Service: 12/03/2023  Chief Complaint  Patient presents with   Follow-up   Hypertension   Urinary Tract Infection    HPI Pt is here for routine follow up -burning with urination yesterday, a little better today. A little right flank pain. Tried cystex not azo -had a UTI last month out of town, had macrobid  and thinks it must not have gotten rid of it and last culture resistant to cipro  -seeing urology later this month, on gemtesa  but costly so working on this. Is having more hot flashes recently and could be related. Also using estradiol  cream. Open to trial of effexor  rather than gabapentin  to see if this helps night sweats but will also discuss gemtesa  as well given recurrent UTIs recently -BP stable normally but a little higher this morning with UTI symptoms limiting sleep last night  Current Medication: Outpatient Encounter Medications as of 12/03/2023  Medication Sig   ALPRAZolam  (XANAX ) 0.25 MG tablet TAKE 1 TABLET BY MOUTH EVERY DAY AT BEDTIME AS NEEDED FOR ANXIETY   amLODipine  (NORVASC ) 5 MG tablet Take 1 tablet (5 mg total) by mouth daily.   aspirin  EC 81 MG tablet Take 81 mg by mouth daily.   estradiol  (ESTRACE  VAGINAL) 0.1 MG/GM vaginal cream Apply 0.5mg  (pea-sized amount)  just inside the vaginal introitus with a finger-tip on Monday, Wednesday and Friday nights.   levothyroxine  (SYNTHROID ) 88 MCG tablet Take 1 tablet (88 mcg total) by mouth daily before breakfast.   rosuvastatin  (CRESTOR ) 5 MG tablet TAKE ONE TABLET BY MOUTH TWICE WEEKLY FOR ATHEROSCLEROSIS   Vibegron  (GEMTESA ) 75 MG TABS Take 1 tablet (75 mg total) by mouth daily.   No facility-administered encounter medications on file as of 12/03/2023.    Surgical History: Past Surgical History:  Procedure Laterality Date   child birth natural      diatation      Medical History: Past Medical History:  Diagnosis Date   Bladder incontinence    Hypertension    Hypothyroidism     Family History: Family History  Problem Relation Age of Onset   Cancer Father    Hyperlipidemia Father    Hypertension Father    Breast cancer Neg Hx     Social History   Socioeconomic History   Marital status: Married    Spouse name: Not on file   Number of children: Not on file   Years of education: Not on file   Highest education level: Not on file  Occupational History   Not on file  Tobacco Use   Smoking status: Former    Types: Cigarettes   Smokeless tobacco: Never  Substance and Sexual Activity   Alcohol use: No   Drug use: No   Sexual activity: Not Currently    Birth control/protection: Surgical, Post-menopausal    Comment: Hysterectomy  Other Topics Concern   Not on file  Social History Narrative   Not on file   Social Drivers of Health   Financial Resource Strain: Low Risk  (04/25/2023)   Received from [facility]   Overall Financial Resource Strain (CARDIA)    Difficulty of Paying Living Expenses: Not very hard  Food Insecurity: Food Insecurity Present (04/25/2023)   Received from [facility]   Hunger Vital Sign    Within the past 12 months, you  worried that your food would run out before you got the money to buy more.: Sometimes true    Within the past 12 months, the food you bought just didn't last and you didn't have money to get more.: Often true  Transportation Needs: No Transportation Needs (04/25/2023)   Received from [facility] - Transportation    In the past 12 months, has lack of transportation kept you from medical appointments or from getting medications?: No    Lack of Transportation (Non-Medical): No  Physical Activity: Not on file  Stress: Not on file  Social Connections: Not on file  Intimate Partner Violence: Not on file       Review of Systems  Constitutional:  Negative for fatigue and fever.  HENT:  Negative for congestion, mouth sores and postnasal drip.   Respiratory:  Negative for cough.   Cardiovascular:  Negative for chest pain.  Gastrointestinal:  Negative for nausea.  Genitourinary:  Positive for dysuria and flank pain.  Skin:  Negative for rash.  Neurological:  Negative for weakness.  Psychiatric/Behavioral:  Positive for sleep disturbance. Negative for dysphoric mood and suicidal ideas.     Vital Signs: BP (!) 143/82   Pulse 79   Temp 98.5 F (36.9 C)   Resp 16   Ht 5' 2 (1.575 m)   Wt 143 lb 12.8 oz (65.2 kg)   SpO2 98%   BMI 26.30 kg/m    Physical Exam Vitals and nursing note reviewed.  Constitutional:      Appearance: Normal appearance.  HENT:     Head: Normocephalic and atraumatic.  Eyes:     Extraocular Movements: Extraocular movements intact.  Cardiovascular:     Rate and Rhythm: Normal rate and regular rhythm.     Pulses: Normal pulses.     Heart sounds: Normal heart sounds.  Pulmonary:     Effort: Pulmonary effort is normal.     Breath sounds: Normal breath sounds.  Abdominal:     Tenderness: There is right CVA tenderness.  Neurological:     General: No focal deficit present.     Mental Status: She is alert.  Psychiatric:        Mood and Affect: Mood normal.        Behavior: Behavior normal.        Assessment/Plan: 1. Essential hypertension (Primary) Borderline in office, but normally well controlled. Continue to monitor - amLODipine  (NORVASC ) 5 MG tablet; Take 1 tablet (5 mg total) by mouth daily.  Dispense: 90 tablet; Refill: 1  2. Urinary tract infection with hematuria, site unspecified Will treat with augmentin  given last culture result and recent macrobid  use. Adjust based on C/S and has urology visit soon - CULTURE, URINE COMPREHENSIVE - amoxicillin -clavulanate (AUGMENTIN ) 875-125 MG tablet; Take 1 tablet by mouth 2 (two) times daily. Take  with food.  Dispense: 14 tablet; Refill: 0  3. Dysuria - POCT Urinalysis Dipstick  4. Acquired hypothyroidism - levothyroxine  (SYNTHROID ) 88 MCG tablet; Take 1 tablet (88 mcg total) by mouth daily before breakfast.  Dispense: 90 tablet; Refill: 1  5. Aortic atherosclerosis (HCC) - rosuvastatin  (CRESTOR ) 5 MG tablet; TAKE ONE TABLET BY MOUTH TWICE WEEKLY FOR ATHEROSCLEROSIS  Dispense: 24 tablet; Refill: 2  6. Night sweats Will trial effexor  and advised to call if any concerns - venlafaxine  XR (EFFEXOR  XR) 37.5 MG 24 hr capsule; Take 1 capsule (37.5 mg total) by mouth daily.  Dispense: 30 capsule; Refill: 2  7. Other  insomnia - ALPRAZolam  (XANAX ) 0.25 MG tablet; TAKE 1 TABLET BY MOUTH EVERY DAY AT BEDTIME AS NEEDED FOR ANXIETY  Dispense: 45 tablet; Refill: 1   General Counseling: antrice pal understanding of the findings of todays visit and agrees with plan of treatment. I have discussed any further diagnostic evaluation that may be needed or ordered today. We also reviewed her medications today. she has been encouraged to call the office with any questions or concerns that should arise related to todays visit.    Orders Placed This Encounter  Procedures   CULTURE, URINE COMPREHENSIVE   POCT Urinalysis Dipstick    No orders of the defined types were placed in this encounter.   This patient was seen by Tinnie Pro, PA-C in collaboration with Dr. Sigrid Bathe as a part of collaborative care agreement.   Total time spent:30 Minutes Time spent includes review of chart, medications, test results, and follow up plan with the patient.      Dr Fozia M Khan Internal medicine

## 2023-12-06 LAB — CULTURE, URINE COMPREHENSIVE

## 2023-12-07 ENCOUNTER — Ambulatory Visit: Payer: Self-pay | Admitting: Physician Assistant

## 2023-12-10 NOTE — Telephone Encounter (Signed)
 Spoke with patient regarding urine culture results.

## 2023-12-10 NOTE — Telephone Encounter (Signed)
-----   Message from Tinnie MARLA Pro sent at 12/07/2023 12:59 PM EDT ----- Please let her know that her culture did confirm UTI and she is on appropriate ABX already ----- Message ----- From: Almer Bi, CMA Sent: 12/03/2023   1:31 PM EDT To: Tinnie MARLA Pro, PA-C

## 2023-12-11 NOTE — Progress Notes (Unsigned)
 12/13/2023 1:21 PM   Brean Carberry Esqueda [id number] [id number]  Referring provider: Kristina Tinnie POUR, PA-C [address] [place],  [place] [address]  Urological history: 1.  Right hydronephrosis - CT (10/2019) - Chronic hydroureteronephrosis on the right without evidence of an obstructing stone. Findings appear quite similar to the study of 2014 and presumably relate to a distal ureteral structure. Patient has a degree of bladder prolapse which could possibly be affecting the UVJ. -Renal Lasix  scan (10/2019) - No findings of high-grade obstructive uropathy bilaterally.  Delayed clearance of the radiopharmaceutical from dilated right renal collecting system which washes out following Lasix  administration.  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 rUTI's - Vaginal estrogen cream, 3 nights weekly  3.  Mixed urinary incontinence - RA-sacrocolpopexy, RSO, retropubic mid urethral sling, posterior repair and cystoscopy (10/2022)  - Oxybutynin -intolerable side effects  No chief complaint on file.  HPI: Ilma Achee Nixon is a 73 y.o. woman who presents today for one month follow up after a trial of Gemtesa .  Previous records reviewed.   PVR *** mL   PMH: Past Medical History:  Diagnosis Date   Bladder incontinence    Hypertension    Hypothyroidism     Surgical History: Past Surgical History:  Procedure Laterality Date   child birth natural     diatation      Home Medications:  Allergies as of 12/13/2023       Reactions   Sulfa Antibiotics Hives, Nausea And Vomiting        Medication List        Accurate as of December 11, 2023  1:21 PM. If you have any questions, ask your nurse or doctor.          ALPRAZolam  0.25 MG tablet Commonly known as: XANAX  TAKE 1 TABLET BY MOUTH  EVERY DAY AT BEDTIME AS NEEDED FOR ANXIETY   amLODipine  5 MG tablet Commonly known as: NORVASC  Take 1 tablet (5 mg total) by mouth daily.   amoxicillin -clavulanate 875-125 MG tablet Commonly known as: AUGMENTIN  Take 1 tablet by mouth 2 (two) times daily. Take with food.   aspirin  EC 81 MG tablet Take 81 mg by mouth daily.   estradiol  0.1 MG/GM vaginal cream Commonly known as: ESTRACE  VAGINAL Apply 0.5mg  (pea-sized amount)  just inside the vaginal introitus with a finger-tip on Monday, Wednesday and Friday nights.   Gemtesa  75 MG Tabs Generic drug: Vibegron  Take 1 tablet (75 mg total) by mouth daily.   levothyroxine  88 MCG tablet Commonly known as: SYNTHROID  Take 1 tablet (88 mcg total) by mouth daily before breakfast.   rosuvastatin  5 MG tablet Commonly known as: CRESTOR  TAKE ONE TABLET BY MOUTH TWICE WEEKLY FOR ATHEROSCLEROSIS   venlafaxine  XR 37.5 MG 24 hr capsule Commonly known as: Effexor  XR Take 1 capsule (37.5 mg total) by mouth daily.        Allergies:  Allergies  Allergen Reactions   Sulfa Antibiotics Hives and Nausea And Vomiting    Family History: Family History  Problem Relation Age of Onset   Cancer Father    Hyperlipidemia Father    Hypertension Father    Breast cancer Neg Hx     Social History:  reports that she has quit smoking. Her smoking use included cigarettes. She has never  used smokeless tobacco. She reports that she does not drink alcohol and does not use drugs.  ROS: Pertinent ROS in HPI  Physical Exam: There were no vitals taken for this visit.  Constitutional:  Well nourished. Alert and oriented, No acute distress. HEENT: [place] AT, moist mucus membranes.  Trachea midline, no masses. Cardiovascular: No clubbing, cyanosis, or edema. Respiratory: Normal respiratory effort, no increased work of breathing. GU: No CVA tenderness.  No bladder fullness or masses.  Recession of labia minora, dry, pale vulvar vaginal mucosa and loss of  mucosal ridges and folds.  Normal urethral meatus, no lesions, no prolapse, no discharge.   No urethral masses, tenderness and/or tenderness. No bladder fullness, tenderness or masses. *** vagina mucosa, *** estrogen effect, no discharge, no lesions, *** pelvic support, *** cystocele and *** rectocele noted.  No cervical motion tenderness.  Uterus is freely mobile and non-fixed.  No adnexal/parametria masses or tenderness noted.  Anus and perineum are without rashes or lesions.   ***  Neurologic: Grossly intact, no focal deficits, moving all 4 extremities. Psychiatric: Normal mood and affect.    Laboratory Data: N/A  Pertinent Imaging: ***   Assessment & Plan:    1. 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 GSM - continue vaginal estrogen cream to 3 nights weekly   No follow-ups on file.  These notes generated with voice recognition software. I apologize for typographical errors.  [name]  [facility] [address]  [address] [place], [place] [address] [phone]

## 2023-12-13 ENCOUNTER — Encounter: Payer: Self-pay | Admitting: Urology

## 2023-12-13 ENCOUNTER — Ambulatory Visit: Admitting: Urology

## 2023-12-13 VITALS — BP 129/75 | HR 76 | Ht 62.0 in | Wt 141.5 lb

## 2023-12-13 DIAGNOSIS — N3941 Urge incontinence: Secondary | ICD-10-CM

## 2023-12-13 DIAGNOSIS — N952 Postmenopausal atrophic vaginitis: Secondary | ICD-10-CM

## 2023-12-13 DIAGNOSIS — N3946 Mixed incontinence: Secondary | ICD-10-CM

## 2023-12-13 LAB — BLADDER SCAN AMB NON-IMAGING

## 2023-12-13 MED ORDER — GEMTESA 75 MG PO TABS: 75.0000 mg | ORAL_TABLET | Freq: Every day | ORAL | 3 refills | Status: DC

## 2023-12-13 NOTE — Patient Instructions (Signed)
 [facility] [employer] [facility]   [phone]

## 2024-01-01 ENCOUNTER — Encounter: Payer: Self-pay | Admitting: Physician Assistant

## 2024-01-01 ENCOUNTER — Telehealth: Payer: Self-pay

## 2024-01-01 ENCOUNTER — Ambulatory Visit: Admitting: Physician Assistant

## 2024-01-01 VITALS — BP 129/78 | HR 80 | Ht 62.0 in | Wt 141.8 lb

## 2024-01-01 DIAGNOSIS — N3941 Urge incontinence: Secondary | ICD-10-CM

## 2024-01-01 DIAGNOSIS — N390 Urinary tract infection, site not specified: Secondary | ICD-10-CM

## 2024-01-01 LAB — URINALYSIS, COMPLETE
Bilirubin, UA: NEGATIVE
Glucose, UA: NEGATIVE
Ketones, UA: NEGATIVE
Nitrite, UA: NEGATIVE
Protein,UA: NEGATIVE
Specific Gravity, UA: <1.005 — ABNORMAL LOW (ref 1.005–1.030)
Urobilinogen, Ur: 0.2 mg/dL (ref 0.2–1.0)
pH, UA: 6 (ref 5.0–7.5)

## 2024-01-01 LAB — MICROSCOPIC EXAMINATION: WBC, UA: >30 /HPF — AB (ref 0–5)

## 2024-01-01 MED ORDER — CEPHALEXIN 500 MG PO CAPS: 500.0000 mg | ORAL_CAPSULE | Freq: Two times a day (BID) | ORAL | 0 refills | Status: AC

## 2024-01-01 MED ORDER — MIRABEGRON ER 50 MG PO TB24: 50.0000 mg | ORAL_TABLET | Freq: Every day | ORAL | 11 refills | Status: DC

## 2024-01-01 NOTE — Telephone Encounter (Signed)
 Patient called with UTI symptoms of burning with urination and urgency. I was able to schedule an open appointment for today and patient knows we need a urine sample when she gets to office.

## 2024-01-01 NOTE — Progress Notes (Signed)
 01/01/2024 3:31 PM   Michelle [name] 1950/06/05 [id number]  CC: Chief Complaint  Patient presents with   incontience   Urinary Incontinence   HPI: Michelle [name] is a 73 y.o. 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   Today she reports 1 day of dysuria and low back pain without fever, nausea, or vomiting.  She had another UTI last month and grew Cipro  and Levaquin  resistant E. coli, but that had been her first infection in several months.  She remains on topical vaginal estrogen cream, using it 3-4 times weekly.  Unfortunately, she reports that she is not able to afford Gemtesa  at $200+ per month.  In-office UA today positive for 1+ blood and 2+ leukocytes; urine microscopy with >30 WBCs/HPF, 3-10 RBCs/HPF, and moderate bacteria.   PMH: Past Medical History:  Diagnosis Date   Bladder incontinence    Hypertension    Hypothyroidism     Surgical History: Past Surgical History:  Procedure Laterality Date   child birth natural     diatation      Home Medications:  Allergies as of 01/01/2024       Reactions   Sulfa Antibiotics Hives, Nausea And Vomiting        Medication List        Accurate as of January 01, 2024  3:31 PM. If you have any questions, ask your nurse or doctor.          ALPRAZolam  0.25 MG tablet Commonly known as: XANAX  TAKE 1 TABLET BY MOUTH EVERY DAY AT BEDTIME AS NEEDED FOR ANXIETY   amLODipine  5 MG tablet Commonly known as: NORVASC  Take 1 tablet (5 mg total) by mouth daily.   amoxicillin -clavulanate 875-125 MG tablet Commonly known as: AUGMENTIN  Take 1 tablet by mouth 2 (two) times daily. Take with food.   aspirin  EC 81 MG tablet Take 81 mg by mouth daily.   estradiol  0.1 MG/GM vaginal cream Commonly  known as: ESTRACE  VAGINAL Apply 0.5mg  (pea-sized amount)  just inside the vaginal introitus with a finger-tip on Monday, Wednesday and Friday nights.   Gemtesa  75 MG Tabs Generic drug: Vibegron  Take 1 tablet (75 mg total) by mouth daily.   Gemtesa  75 MG Tabs Generic drug: Vibegron  Take 1 tablet (75 mg total) by mouth daily.   levothyroxine  88 MCG tablet Commonly known as: SYNTHROID  Take 1 tablet (88 mcg total) by mouth daily before breakfast.   rosuvastatin  5 MG tablet Commonly known as: CRESTOR  TAKE ONE TABLET BY MOUTH TWICE WEEKLY FOR ATHEROSCLEROSIS   venlafaxine  XR 37.5 MG 24 hr capsule Commonly known as: Effexor  XR Take 1 capsule (37.5 mg total) by mouth daily.        Allergies:  Allergies  Allergen Reactions   Sulfa Antibiotics Hives and Nausea And Vomiting    Family History: Family History  Problem Relation Age of Onset   Cancer Father    Hyperlipidemia Father    Hypertension Father    Breast cancer Neg Hx     Social History:   reports that she has quit smoking. Her smoking use included cigarettes. She has never used smokeless tobacco. She reports that she does not drink alcohol and does not use drugs.  Physical Exam: BP 129/78 (BP Location: Left Arm, Patient Position: Sitting, Cuff Size: Normal)  Pulse 80   Ht 5' 2 (1.575 m)   Wt 141 lb 12.8 oz (64.3 kg)   BMI 25.94 kg/m   Constitutional:  Alert and oriented, no acute distress, nontoxic appearing HEENT: [place], AT Cardiovascular: No clubbing, cyanosis, or edema Respiratory: Normal respiratory effort, no increased work of breathing Skin: No rashes, bruises or suspicious lesions Neurologic: Grossly intact, no focal deficits, moving all 4 extremities Psychiatric: Normal mood and affect  Laboratory Data: Results for orders placed or performed in visit on 01/01/24  Microscopic Examination   Collection Time: 01/01/24  3:03 PM   Urine  Result Value Ref Range   WBC, UA >30 (A) 0 - 5 /hpf   RBC, Urine  3-10 (A) 0 - 2 /hpf   Epithelial Cells (non renal) 0-10 0 - 10 /hpf   Bacteria, UA Moderate (A) None seen/Few  Urinalysis, Complete   Collection Time: 01/01/24  3:03 PM  Result Value Ref Range   Specific Gravity, UA <1.005 (L) 1.005 - 1.030   pH, UA 6.0 5.0 - 7.5   Color, UA Yellow Yellow   Appearance Ur Slightly cloudy Clear   Leukocytes,UA 2+ (A) Negative   Protein,UA Negative Negative/Trace   Glucose, UA Negative Negative   Ketones, UA Negative Negative   RBC, UA 1+ (A) Negative   Bilirubin, UA Negative Negative   Urobilinogen, Ur 0.2 0.2 - 1.0 mg/dL   Nitrite, UA Negative Negative   Microscopic Examination See below:    Assessment & Plan:   1. Recurrent UTI (Primary) UA appears grossly infected today, will start empiric Keflex  and send for culture for further evaluation.  Will extend antibiotics to 7 days in light of her chronic right hydroureteronephrosis, though notably there is no evidence of upper tract involvement in clinic today. - Urinalysis, Complete - CULTURE, URINE COMPREHENSIVE - cephALEXin  (KEFLEX ) 500 MG capsule; Take 1 capsule (500 mg total) by mouth 2 (two) times daily for 7 days.  Dispense: 14 capsule; Refill: 0  2. Urge incontinence She failed oxybutynin  due to anticholinergic side effects and Gemtesa  due to cost.  Will see if her insurance covers Myrbetriq  50 mg at a cheaper rate, and if unsuccessful with this may consider trospium versus third line therapies in the future. - mirabegron  ER (MYRBETRIQ ) 50 MG TB24 tablet; Take 1 tablet (50 mg total) by mouth daily.  Dispense: 30 tablet; Refill: 11   Return in about 6 weeks (around 02/12/2024) for Symptom recheck with PVR.  Lucie Hones, PA-C  [facility]  [address], [address] [place], [place] [address] [phone]

## 2024-01-04 ENCOUNTER — Ambulatory Visit: Payer: Self-pay | Admitting: Physician Assistant

## 2024-01-04 LAB — CULTURE, URINE COMPREHENSIVE

## 2024-01-07 ENCOUNTER — Ambulatory Visit: Admitting: Physician Assistant

## 2024-01-14 ENCOUNTER — Ambulatory Visit (INDEPENDENT_AMBULATORY_CARE_PROVIDER_SITE_OTHER): Admitting: Physician Assistant

## 2024-01-14 VITALS — BP 138/80 | HR 80 | Temp 97.8°F | Resp 16 | Ht 62.0 in | Wt 141.0 lb

## 2024-01-14 DIAGNOSIS — G4709 Other insomnia: Secondary | ICD-10-CM

## 2024-01-14 DIAGNOSIS — I10 Essential (primary) hypertension: Secondary | ICD-10-CM

## 2024-01-14 NOTE — Progress Notes (Signed)
 [facility] [address] [place], [place] [address]  Internal MEDICINE  Office Visit Note  Patient Name: Michelle [name]  [id number]  [id number]  Date of Service: 01/14/2024  Chief Complaint  Patient presents with   Follow-up   Hypertension    HPI Pt is here for routine follow up -At home 128/73, 127/67 -can't take gemtesa  either now, costly. But just stopped samples in the last day. Looks like myrbetriq  sent instead though pt states she never got this and will follow up with pharmacy/urology -Recently treated for UTI, symptoms improved -nerves/on edge while trying to sleep at night. Will take 1/2 xanax  but will sometimes still wake part way through and mind will wonder. Unclear if stopping gentesa is helping night sweats because only off for 1 day so far. -did not start on effexor , was scared about S/E. Did pick it up but hasn't tried it yet and may do so tonight since she realized it could help her nerves  -BP stable -a little congestion recently and right ear a little muffled. Does have significant cerumen present  Current Medication: Outpatient Encounter Medications as of 01/14/2024  Medication Sig   ALPRAZolam  (XANAX ) 0.25 MG tablet TAKE 1 TABLET BY MOUTH EVERY DAY AT BEDTIME AS NEEDED FOR ANXIETY   amLODipine  (NORVASC ) 5 MG tablet Take 1 tablet (5 mg total) by mouth daily.   aspirin  EC 81 MG tablet Take 81 mg by mouth daily.   estradiol  (ESTRACE  VAGINAL) 0.1 MG/GM vaginal cream Apply 0.5mg  (pea-sized amount)  just inside the vaginal introitus with a finger-tip on Monday, Wednesday and Friday nights.   levothyroxine  (SYNTHROID ) 88 MCG tablet Take 1 tablet (88 mcg total) by mouth daily before breakfast.   mirabegron  ER (MYRBETRIQ ) 50 MG TB24 tablet Take 1 tablet (50 mg total) by mouth daily.   rosuvastatin  (CRESTOR ) 5 MG tablet TAKE ONE TABLET BY MOUTH TWICE WEEKLY FOR ATHEROSCLEROSIS   venlafaxine  XR (EFFEXOR  XR) 37.5 MG 24 hr capsule Take 1 capsule (37.5 mg  total) by mouth daily.   [DISCONTINUED] amoxicillin -clavulanate (AUGMENTIN ) 875-125 MG tablet Take 1 tablet by mouth 2 (two) times daily. Take with food.   No facility-administered encounter medications on file as of 01/14/2024.    Surgical History: Past Surgical History:  Procedure Laterality Date   child birth natural     diatation      Medical History: Past Medical History:  Diagnosis Date   Bladder incontinence    Hypertension    Hypothyroidism     Family History: Family History  Problem Relation Age of Onset   Cancer Father    Hyperlipidemia Father    Hypertension Father    Breast cancer Neg Hx     Social History   Socioeconomic History   Marital status: Married    Spouse name: Not on file   Number of children: Not on file   Years of education: Not on file   Highest education level: Not on file  Occupational History   Not on file  Tobacco Use   Smoking status: Former    Types: Cigarettes   Smokeless tobacco: Never  Substance and Sexual Activity   Alcohol use: No   Drug use: No   Sexual activity: Not Currently    Birth control/protection: Surgical, Post-menopausal    Comment: Hysterectomy  Other Topics Concern   Not on file  Social History Narrative   Not on file   Social Drivers of Health   Financial Resource Strain: Low Risk  (  04/25/2023)   Received from [facility]   Overall Financial Resource Strain (CARDIA)    Difficulty of Paying Living Expenses: Not very hard  Food Insecurity: Food Insecurity Present (04/25/2023)   Received from [facility]   Hunger Vital Sign    Within the past 12 months, you worried that your food would run out before you got the money to buy more.: Sometimes true    Within the past 12 months, the food you bought just didn't last and you didn't have money to get more.: Often true  Transportation Needs: No Transportation Needs (04/25/2023)   Received from [facility] - Transportation    In the past 12 months, has lack of transportation kept you from medical appointments or from getting medications?: No    Lack of Transportation (Non-Medical): No  Physical Activity: Not on file  Stress: Not on file  Social Connections: Not on file  Intimate Partner Violence: Not on file      Review of Systems  Constitutional:  Negative for chills, fatigue and unexpected weight change.  HENT:  Negative for congestion, postnasal drip, rhinorrhea, sneezing and sore throat.   Eyes:  Negative for redness.  Respiratory:  Negative for cough, chest tightness and shortness of breath.   Cardiovascular:  Negative for chest pain and palpitations.  Gastrointestinal:  Negative for abdominal pain, constipation, diarrhea, nausea and vomiting.  Genitourinary:  Negative for dysuria.  Musculoskeletal:  Negative for back pain, joint swelling and neck pain.  Skin:  Negative for rash.  Neurological: Negative.  Negative for tremors and numbness.  Hematological:  Negative for adenopathy. Does not bruise/bleed easily.  Psychiatric/Behavioral:  Positive for sleep disturbance. Negative for behavioral problems (Depression) and suicidal ideas. The patient is nervous/anxious.     Vital Signs: BP 138/80   Pulse 80   Temp 97.8 F (36.6 C)   Resp 16   Ht 5' 2 (1.575 m)   Wt 141 lb (64 kg)   SpO2 97%   BMI 25.79 kg/m    Physical Exam Constitutional:      Appearance: Normal appearance.  HENT:     Head: Normocephalic and atraumatic.     Right Ear: There is impacted cerumen.     Left Ear: Tympanic membrane normal.  Eyes:     Extraocular Movements: Extraocular movements intact.     Pupils: Pupils are equal, round, and reactive to light.  Cardiovascular:     Rate and Rhythm: Normal rate and regular rhythm.     Heart sounds: Normal heart sounds.  Pulmonary:     Effort: Pulmonary effort is normal.     Breath sounds: Normal breath sounds.  Neurological:     General: No  focal deficit present.     Mental Status: She is alert.  Psychiatric:        Mood and Affect: Mood normal.        Behavior: Behavior normal.        Assessment/Plan: 1. Essential hypertension (Primary) Stable, continue current medication  2. Other insomnia May try effexor  to help with anxiety and night sweats impacting sleep if these continue after stopping gentesa.   General Counseling: francille wittmann understanding of the findings of todays visit and agrees with plan of treatment. I have discussed any further diagnostic evaluation that may be needed or ordered today. We also reviewed her medications today. she has been encouraged to call the office with any questions or concerns that  should arise related to todays visit.    No orders of the defined types were placed in this encounter.   No orders of the defined types were placed in this encounter.   This patient was seen by Tinnie Pro, PA-C in collaboration with Dr. Sigrid Bathe as a part of collaborative care agreement.   Total time spent:30 Minutes Time spent includes review of chart, medications, test results, and follow up plan with the patient.      Dr Fozia M Khan Internal medicine

## 2024-02-12 NOTE — Progress Notes (Unsigned)
 02/13/2024 6:48 PM   Michelle [name] 1950-06-16 [id number]  Referring provider: Kristina Tinnie POUR, PA-C [address] [place],  [place] [address]  Urological history: 1.  Right hydronephrosis - CT (10/2019) - Chronic hydroureteronephrosis on the right without evidence of an obstructing stone. Findings appear quite similar to the study of 2014 and presumably relate to a distal ureteral structure. Patient has a degree of bladder prolapse which could possibly be affecting the UVJ. -Renal Lasix  scan (10/2019) - No findings of high-grade obstructive uropathy bilaterally.  Delayed clearance of the radiopharmaceutical from dilated right renal collecting system which washes out following Lasix  administration.  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 rUTI's - Vaginal estrogen cream, 3 nights weekly  3.  Mixed urinary incontinence - RA-sacrocolpopexy, RSO, retropubic mid urethral sling, posterior repair and cystoscopy (10/2022)  - Oxybutynin -intolerable side effects - Gemtesa  75 mg, cost-prohibitive  No chief complaint on file.  HPI: Michelle [name] is a 73 y.o. woman who presents today for 6 week follow after a trial of Myrbetriq  50 mg daily.    Previous records reviewed.   UA ***  PMH: Past Medical History:  Diagnosis Date   Bladder incontinence    Hypertension    Hypothyroidism     Surgical History: Past Surgical History:  Procedure Laterality Date   child birth natural     diatation      Home Medications:  Allergies as of 02/13/2024       Reactions   Sulfa Antibiotics Hives, Nausea And Vomiting        Medication List        Accurate as of February 12, 2024  6:48 PM. If you have any questions, ask your nurse or doctor.          ALPRAZolam  0.25 MG  tablet Commonly known as: XANAX  TAKE 1 TABLET BY MOUTH EVERY DAY AT BEDTIME AS NEEDED FOR ANXIETY   amLODipine  5 MG tablet Commonly known as: NORVASC  Take 1 tablet (5 mg total) by mouth daily.   aspirin  EC 81 MG tablet Take 81 mg by mouth daily.   estradiol  0.1 MG/GM vaginal cream Commonly known as: ESTRACE  VAGINAL Apply 0.5mg  (pea-sized amount)  just inside the vaginal introitus with a finger-tip on Monday, Wednesday and Friday nights.   levothyroxine  88 MCG tablet Commonly known as: SYNTHROID  Take 1 tablet (88 mcg total) by mouth daily before breakfast.   mirabegron  ER 50 MG Tb24 tablet Commonly known as: MYRBETRIQ  Take 1 tablet (50 mg total) by mouth daily.   rosuvastatin  5 MG tablet Commonly known as: CRESTOR  TAKE ONE TABLET BY MOUTH TWICE WEEKLY FOR ATHEROSCLEROSIS   venlafaxine  XR 37.5 MG 24 hr capsule Commonly known as: Effexor  XR Take 1 capsule (37.5 mg total) by mouth daily.        Allergies:  Allergies  Allergen Reactions   Sulfa Antibiotics Hives and Nausea And Vomiting    Family History: Family History  Problem Relation Age of Onset   Cancer Father    Hyperlipidemia Father    Hypertension Father    Breast cancer Neg Hx     Social History:  reports that she has quit smoking. Her smoking use included cigarettes. She has never used smokeless tobacco. She reports that she does not drink alcohol and  does not use drugs.  ROS: Pertinent ROS in HPI  Physical Exam: There were no vitals taken for this visit.  Constitutional:  Well nourished. Alert and oriented, No acute distress. HEENT: [place] AT, moist mucus membranes.  Trachea midline, no masses. Cardiovascular: No clubbing, cyanosis, or edema. Respiratory: Normal respiratory effort, no increased work of breathing. GU: No CVA tenderness.  No bladder fullness or masses.  Recession of labia minora, dry, pale vulvar vaginal mucosa and loss of mucosal ridges and folds.  Normal urethral meatus, no lesions, no  prolapse, no discharge.   No urethral masses, tenderness and/or tenderness. No bladder fullness, tenderness or masses. *** vagina mucosa, *** estrogen effect, no discharge, no lesions, *** pelvic support, *** cystocele and *** rectocele noted.  No cervical motion tenderness.  Uterus is freely mobile and non-fixed.  No adnexal/parametria masses or tenderness noted.  Anus and perineum are without rashes or lesions.   ***  Neurologic: Grossly intact, no focal deficits, moving all 4 extremities. Psychiatric: Normal mood and affect.     Laboratory Data: See HPI and EPIC I have reviewed the labs.  See HPI.     Pertinent Imaging: N/A   Assessment & Plan:    1. 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  Given Myrbetriq  75 mg samples, #28.  I have reviewed with the patient of the side effects of Myrbetriq , such as: elevation in BP, urinary retention and/or HA.  She will return in one month for PVR and symptom recheck.  ***  - would like to try anticholinergic therapy.  Given Vesicare 5mg /10mg , Toviaz 4mg /8mg  samples, # 28.   Advised of the side effects, such as: Dry eyes, dry mouth, constipation, mental confusion and/or urinary retention. ***  - RTC in 3 weeks for PVR and symptom recheck *** 2. GSM - continue vaginal estrogen cream to 3 nights weekly   No follow-ups on file.  These notes generated with voice recognition software. I apologize for typographical  errors.  Michelle [name]  [facility] [address]  [address] [place], [place] [address] [phone]

## 2024-02-13 ENCOUNTER — Encounter: Payer: Self-pay | Admitting: Urology

## 2024-02-13 ENCOUNTER — Ambulatory Visit: Admitting: Urology

## 2024-02-13 VITALS — BP 110/70 | HR 80 | Ht 62.0 in | Wt 140.0 lb

## 2024-02-13 DIAGNOSIS — N390 Urinary tract infection, site not specified: Secondary | ICD-10-CM

## 2024-02-13 DIAGNOSIS — N952 Postmenopausal atrophic vaginitis: Secondary | ICD-10-CM

## 2024-02-13 DIAGNOSIS — N8111 Cystocele, midline: Secondary | ICD-10-CM

## 2024-02-13 DIAGNOSIS — N3946 Mixed incontinence: Secondary | ICD-10-CM | POA: Diagnosis not present

## 2024-02-13 DIAGNOSIS — N819 Female genital prolapse, unspecified: Secondary | ICD-10-CM

## 2024-02-13 LAB — MICROSCOPIC EXAMINATION

## 2024-02-13 LAB — BLADDER SCAN AMB NON-IMAGING

## 2024-02-13 MED ORDER — ESTRADIOL 0.1 MG/GM VA CREA: TOPICAL_CREAM | VAGINAL | 12 refills | Status: AC

## 2024-02-14 ENCOUNTER — Encounter: Payer: Self-pay | Admitting: Physician Assistant

## 2024-02-14 ENCOUNTER — Ambulatory Visit (INDEPENDENT_AMBULATORY_CARE_PROVIDER_SITE_OTHER): Admitting: Physician Assistant

## 2024-02-14 VITALS — BP 128/88 | HR 78 | Temp 97.8°F | Resp 16 | Ht 62.0 in | Wt 141.0 lb

## 2024-02-14 DIAGNOSIS — G4709 Other insomnia: Secondary | ICD-10-CM | POA: Diagnosis not present

## 2024-02-14 DIAGNOSIS — I10 Essential (primary) hypertension: Secondary | ICD-10-CM | POA: Diagnosis not present

## 2024-02-14 LAB — URINALYSIS, COMPLETE
Bilirubin, UA: NEGATIVE
Glucose, UA: NEGATIVE
Ketones, UA: NEGATIVE
Nitrite, UA: NEGATIVE
Protein,UA: NEGATIVE
Specific Gravity, UA: <1.005 — ABNORMAL LOW (ref 1.005–1.030)
Urobilinogen, Ur: 0.2 mg/dL (ref 0.2–1.0)
pH, UA: 6 (ref 5.0–7.5)

## 2024-02-14 LAB — MICROSCOPIC EXAMINATION
Epithelial Cells (non renal): >10 /HPF — AB (ref 0–10)
WBC, UA: NONE SEEN /HPF (ref 0–5)

## 2024-02-14 NOTE — Progress Notes (Signed)
 [facility] [address] [place], [place] [address]  Internal MEDICINE  Office Visit Note  Patient Name: Michelle [name]  [id number]  [id number]  Date of Service: 03/08/2024  Chief Complaint  Patient presents with   Follow-up   Hypertension    HPI Pt is here for routine follow up -Bp has been good -doing well with effexor , had a headache first few days but this resolved. Feels more energetic. Sleep getting better, but still wakes up some. Hot flashes not as strong -did see urology yesterday, insurance would not cover myrbetriq  or gemtesa , and oxybutynin  didnt work. Not taking anything else. Just using estrogen cream -will check on RSV vaccine, if she had it or not  Current Medication: Outpatient Encounter Medications as of 02/14/2024  Medication Sig   ALPRAZolam  (XANAX ) 0.25 MG tablet TAKE 1 TABLET BY MOUTH EVERY DAY AT BEDTIME AS NEEDED FOR ANXIETY   amLODipine  (NORVASC ) 5 MG tablet Take 1 tablet (5 mg total) by mouth daily.   aspirin  EC 81 MG tablet Take 81 mg by mouth daily.   estradiol  (ESTRACE  VAGINAL) 0.1 MG/GM vaginal cream Apply 0.5mg  (pea-sized amount)  just inside the vaginal introitus with a finger-tip on Monday, Wednesday and Friday nights.   levothyroxine  (SYNTHROID ) 88 MCG tablet Take 1 tablet (88 mcg total) by mouth daily before breakfast.   rosuvastatin  (CRESTOR ) 5 MG tablet TAKE ONE TABLET BY MOUTH TWICE WEEKLY FOR ATHEROSCLEROSIS   venlafaxine  XR (EFFEXOR  XR) 37.5 MG 24 hr capsule Take 1 capsule (37.5 mg total) by mouth daily.   No facility-administered encounter medications on file as of 02/14/2024.    Surgical History: Past Surgical History:  Procedure Laterality Date   child birth natural     diatation      Medical History: Past Medical History:  Diagnosis Date   Bladder incontinence    Hypertension    Hypothyroidism     Family History: Family History  Problem Relation Age of Onset   Cancer Father    Hyperlipidemia Father     Hypertension Father    Breast cancer Neg Hx     Social History   Socioeconomic History   Marital status: Married    Spouse name: Not on file   Number of children: Not on file   Years of education: Not on file   Highest education level: Not on file  Occupational History   Not on file  Tobacco Use   Smoking status: Former    Types: Cigarettes   Smokeless tobacco: Never  Substance and Sexual Activity   Alcohol use: No   Drug use: No   Sexual activity: Not Currently    Birth control/protection: Surgical, Post-menopausal    Comment: Hysterectomy  Other Topics Concern   Not on file  Social History Narrative   Not on file   Social Drivers of Health   Financial Resource Strain: Low Risk  (04/25/2023)   Received from [facility]   Overall Financial Resource Strain (CARDIA)    Difficulty of Paying Living Expenses: Not very hard  Food Insecurity: Food Insecurity Present (04/25/2023)   Received from [facility]   Hunger Vital Sign    Within the past 12 months, you worried that your food would run out before you got the money to buy more.: Sometimes true    Within the past 12 months, the food you bought just didn't last and you didn't have money to get more.: Often true  Transportation Needs: No  Transportation Needs (04/25/2023)   Received from [facility] - Transportation    In the past 12 months, has lack of transportation kept you from medical appointments or from getting medications?: No    Lack of Transportation (Non-Medical): No  Physical Activity: Not on file  Stress: Not on file  Social Connections: Not on file  Intimate Partner Violence: Not on file      Review of Systems  Constitutional:  Negative for chills, fatigue and unexpected weight change.  HENT:  Negative for congestion, postnasal drip, rhinorrhea, sneezing and sore throat.   Eyes:  Negative for redness.  Respiratory:  Negative for cough, chest  tightness and shortness of breath.   Cardiovascular:  Negative for chest pain and palpitations.  Gastrointestinal:  Negative for abdominal pain, constipation, diarrhea, nausea and vomiting.  Genitourinary:  Negative for dysuria.  Musculoskeletal:  Negative for back pain, joint swelling and neck pain.  Skin:  Negative for rash.  Neurological: Negative.  Negative for tremors and numbness.  Hematological:  Negative for adenopathy. Does not bruise/bleed easily.  Psychiatric/Behavioral:  Positive for sleep disturbance. Negative for behavioral problems (Depression) and suicidal ideas. The patient is nervous/anxious.     Vital Signs: BP 128/88 Comment: 140/80  Pulse 78   Temp 97.8 F (36.6 C)   Resp 16   Ht 5' 2 (1.575 m)   Wt 141 lb (64 kg)   SpO2 96%   BMI 25.79 kg/m    Physical Exam Constitutional:      Appearance: Normal appearance.  HENT:     Head: Normocephalic and atraumatic.     Left Ear: Tympanic membrane normal.  Eyes:     Extraocular Movements: Extraocular movements intact.     Pupils: Pupils are equal, round, and reactive to light.  Cardiovascular:     Rate and Rhythm: Normal rate and regular rhythm.     Heart sounds: Normal heart sounds.  Pulmonary:     Effort: Pulmonary effort is normal.     Breath sounds: Normal breath sounds.  Neurological:     General: No focal deficit present.     Mental Status: She is alert.  Psychiatric:        Mood and Affect: Mood normal.        Behavior: Behavior normal.        Assessment/Plan: 1. Essential hypertension (Primary) Stable, continue current medication  2. Other insomnia Improving, continue effexor    General Counseling: guillermina shaft understanding of the findings of todays visit and agrees with plan of treatment. I have discussed any further diagnostic evaluation that may be needed or ordered today. We also reviewed her medications today. she has been encouraged to call the office with any questions or  concerns that should arise related to todays visit.    No orders of the defined types were placed in this encounter.   No orders of the defined types were placed in this encounter.   This patient was seen by Tinnie Pro, PA-C in collaboration with Dr. Sigrid Bathe as a part of collaborative care agreement.   Total time spent:30 Minutes Time spent includes review of chart, medications, test results, and follow up plan with the patient.      Dr Fozia M Khan Internal medicine

## 2024-02-29 ENCOUNTER — Ambulatory Visit: Admitting: Physician Assistant

## 2024-03-07 DIAGNOSIS — M65311 Trigger thumb, right thumb: Secondary | ICD-10-CM | POA: Diagnosis not present

## 2024-03-07 DIAGNOSIS — M79641 Pain in right hand: Secondary | ICD-10-CM | POA: Diagnosis not present

## 2024-03-18 ENCOUNTER — Telehealth: Payer: Self-pay

## 2024-03-18 NOTE — Telephone Encounter (Signed)
 Called patient to get her scheduled for PTNS

## 2024-03-19 ENCOUNTER — Ambulatory Visit
Admission: RE | Admit: 2024-03-19 | Discharge: 2024-03-19 | Disposition: A | Discharge status: Home / Self Care | Attending: Nurse Practitioner | Admitting: Nurse Practitioner

## 2024-03-19 ENCOUNTER — Ambulatory Visit (INDEPENDENT_AMBULATORY_CARE_PROVIDER_SITE_OTHER): Admitting: Nurse Practitioner

## 2024-03-19 ENCOUNTER — Ambulatory Visit
Admission: RE | Admit: 2024-03-19 | Discharge: 2024-03-19 | Disposition: A | Discharge status: Home / Self Care | Source: Ambulatory Visit | Attending: Nurse Practitioner | Admitting: Nurse Practitioner

## 2024-03-19 ENCOUNTER — Encounter: Payer: Self-pay | Admitting: Nurse Practitioner

## 2024-03-19 VITALS — BP 138/86 | HR 97 | Temp 96.5°F | Resp 16 | Ht 62.0 in | Wt 138.2 lb

## 2024-03-19 DIAGNOSIS — J0100 Acute maxillary sinusitis, unspecified: Secondary | ICD-10-CM

## 2024-03-19 DIAGNOSIS — R0989 Other specified symptoms and signs involving the circulatory and respiratory systems: Secondary | ICD-10-CM | POA: Diagnosis not present

## 2024-03-19 DIAGNOSIS — J189 Pneumonia, unspecified organism: Secondary | ICD-10-CM | POA: Diagnosis not present

## 2024-03-19 DIAGNOSIS — R051 Acute cough: Secondary | ICD-10-CM

## 2024-03-19 MED ORDER — PREDNISONE 10 MG PO TABS: ORAL_TABLET | ORAL | 0 refills | Status: DC

## 2024-03-19 MED ORDER — LEVOFLOXACIN 750 MG PO TABS: 750.0000 mg | ORAL_TABLET | Freq: Every day | ORAL | 0 refills | Status: AC

## 2024-03-19 NOTE — Progress Notes (Signed)
 [facility] [address] [place], [place] [address]  Internal MEDICINE  Office Visit Note  Patient Name: Michelle [name]  [id number]  [id number]  Date of Service: 03/19/2024  Chief Complaint  Patient presents with   Acute Visit    Sinus infection, cough     HPI Sherman presents for an acute sick visit for upper respiratory infection symptoms  --onset of symptoms was about a week ago --reports cough, fever, chills. Body aches, sore throat, chest congestion.  Negative for covid on home test.  No other concerns will be addressed during sick visit except for standard med refills. Will need follow up for additional problems or concerns.     Current Medication:  Outpatient Encounter Medications as of 03/19/2024  Medication Sig   levofloxacin  (LEVAQUIN ) 750 MG tablet Take 1 tablet (750 mg total) by mouth daily for 7 days. Take with food   predniSONE  (DELTASONE ) 10 MG tablet Take one tab 3 x day for 3 days, then take one tab 2 x a day for 3 days and then take one tab a day for 3 days for pneumonia   ALPRAZolam  (XANAX ) 0.25 MG tablet TAKE 1 TABLET BY MOUTH EVERY DAY AT BEDTIME AS NEEDED FOR ANXIETY   amLODipine  (NORVASC ) 5 MG tablet Take 1 tablet (5 mg total) by mouth daily.   aspirin  EC 81 MG tablet Take 81 mg by mouth daily.   estradiol  (ESTRACE  VAGINAL) 0.1 MG/GM vaginal cream Apply 0.5mg  (pea-sized amount)  just inside the vaginal introitus with a finger-tip on Monday, Wednesday and Friday nights.   levothyroxine  (SYNTHROID ) 88 MCG tablet Take 1 tablet (88 mcg total) by mouth daily before breakfast.   rosuvastatin  (CRESTOR ) 5 MG tablet TAKE ONE TABLET BY MOUTH TWICE WEEKLY FOR ATHEROSCLEROSIS   venlafaxine  XR (EFFEXOR  XR) 37.5 MG 24 hr capsule Take 1 capsule (37.5 mg total) by mouth daily.   No facility-administered encounter medications on file as of 03/19/2024.      Medical History: Past Medical History:  Diagnosis Date   Bladder incontinence     Hypertension    Hypothyroidism      Vital Signs: BP 138/86   Pulse 97   Temp (!) 96.5 F (35.8 C)   Resp 16   Ht 5' 2 (1.575 m)   Wt 138 lb 3.2 oz (62.7 kg)   SpO2 96%   BMI 25.28 kg/m    Review of Systems  Constitutional:  Positive for chills, fatigue and fever.  HENT:  Positive for congestion, ear pain, postnasal drip, rhinorrhea, sinus pressure, sinus pain and sore throat. Negative for trouble swallowing.   Respiratory:  Positive for cough and wheezing. Negative for chest tightness and shortness of breath.   Cardiovascular: Negative.  Negative for chest pain and palpitations.  Gastrointestinal:  Negative for diarrhea, nausea and vomiting.  Musculoskeletal:  Positive for myalgias (body aches).  Neurological:  Positive for headaches.    Physical Exam Vitals reviewed.  Constitutional:      General: She is not in acute distress.    Appearance: Normal appearance. She is ill-appearing.  HENT:     Head: Normocephalic and atraumatic.     Right Ear: Tympanic membrane, ear canal and external ear normal.     Left Ear: Tympanic membrane, ear canal and external ear normal.     Nose: Congestion and rhinorrhea present.     Mouth/Throat:     Mouth: Mucous membranes are moist.     Pharynx: Posterior oropharyngeal erythema present.  Eyes:     Pupils: Pupils are equal, round, and reactive to light.  Cardiovascular:     Rate and Rhythm: Normal rate and regular rhythm.     Pulses: Normal pulses.     Heart sounds: Normal heart sounds. No murmur heard. Pulmonary:     Effort: Pulmonary effort is normal. No respiratory distress.     Breath sounds: Normal breath sounds. No wheezing.  Lymphadenopathy:     Cervical: Cervical adenopathy present.  Skin:    General: Skin is warm and dry.     Capillary Refill: Capillary refill takes less than 2 seconds.  Neurological:     Mental Status: She is alert and oriented to person, place, and time.  Psychiatric:        Mood and Affect: Mood  normal.        Behavior: Behavior normal.       Assessment/Plan: 1. Walking pneumonia (Primary) Levofloxacin  and prednisone  taper prescribed, take until gone. Chest xray ordered - levofloxacin  (LEVAQUIN ) 750 MG tablet; Take 1 tablet (750 mg total) by mouth daily for 7 days. Take with food  Dispense: 7 tablet; Refill: 0 - predniSONE  (DELTASONE ) 10 MG tablet; Take one tab 3 x day for 3 days, then take one tab 2 x a day for 3 days and then take one tab a day for 3 days for pneumonia  Dispense: 18 tablet; Refill: 0 - DG Chest 2 View; Future  2. Acute non-recurrent maxillary sinusitis Levofloxacin  and prednisone  taper prescribed, take until gone. Chest xray ordered - levofloxacin  (LEVAQUIN ) 750 MG tablet; Take 1 tablet (750 mg total) by mouth daily for 7 days. Take with food  Dispense: 7 tablet; Refill: 0 - predniSONE  (DELTASONE ) 10 MG tablet; Take one tab 3 x day for 3 days, then take one tab 2 x a day for 3 days and then take one tab a day for 3 days for pneumonia  Dispense: 18 tablet; Refill: 0 - DG Chest 2 View; Future  3. Acute cough Continue otc cough medication   General Counseling: emmy keng understanding of the findings of todays visit and agrees with plan of treatment. I have discussed any further diagnostic evaluation that may be needed or ordered today. We also reviewed her medications today. she has been encouraged to call the office with any questions or concerns that should arise related to todays visit.    Counseling:    Orders Placed This Encounter  Procedures   DG Chest 2 View    Meds ordered this encounter  Medications   levofloxacin  (LEVAQUIN ) 750 MG tablet    Sig: Take 1 tablet (750 mg total) by mouth daily for 7 days. Take with food    Dispense:  7 tablet    Refill:  0    Fill new script today   predniSONE  (DELTASONE ) 10 MG tablet    Sig: Take one tab 3 x day for 3 days, then take one tab 2 x a day for 3 days and then take one tab a day for 3  days for pneumonia    Dispense:  18 tablet    Refill:  0    Fill new script today    Return in about 4 weeks (around 04/16/2024) for F/U for resolution of pneumonia with lauren PCP.  [place] Controlled Substance Database was reviewed by me for overdose risk score (ORS)  Time spent:30 Minutes Time spent with patient included reviewing progress notes, labs, imaging studies, and discussing plan for follow up.  This patient was seen by Mardy Maxin, FNP-C in collaboration with Dr. Sigrid Bathe as a part of collaborative care agreement.  Kayven Aldaco R. Maxin, MSN, FNP-C Internal Medicine

## 2024-04-07 DIAGNOSIS — M1711 Unilateral primary osteoarthritis, right knee: Secondary | ICD-10-CM | POA: Diagnosis not present

## 2024-04-11 ENCOUNTER — Encounter: Payer: Self-pay | Admitting: Physician Assistant

## 2024-04-11 ENCOUNTER — Encounter: Payer: Self-pay | Admitting: Nurse Practitioner

## 2024-04-11 ENCOUNTER — Ambulatory Visit (INDEPENDENT_AMBULATORY_CARE_PROVIDER_SITE_OTHER): Admitting: Physician Assistant

## 2024-04-11 VITALS — BP 106/75 | HR 87 | Temp 98.0°F | Resp 16 | Ht 62.0 in | Wt 137.0 lb

## 2024-04-11 DIAGNOSIS — R52 Pain, unspecified: Secondary | ICD-10-CM | POA: Diagnosis not present

## 2024-04-11 DIAGNOSIS — J0100 Acute maxillary sinusitis, unspecified: Secondary | ICD-10-CM | POA: Diagnosis not present

## 2024-04-11 LAB — POCT INFLUENZA A/B
Influenza A, POC: NEGATIVE
Influenza B, POC: NEGATIVE — AB

## 2024-04-11 MED ORDER — AMOXICILLIN-POT CLAVULANATE 875-125 MG PO TABS: 1.0000 | ORAL_TABLET | Freq: Two times a day (BID) | ORAL | 0 refills | Status: DC

## 2024-04-11 NOTE — Progress Notes (Signed)
 [facility] [address] [place], [place] [address]  Internal MEDICINE  Office Visit Note  Patient Name: Michelle [name]  [id number]  [id number]  Date of Service: 04/11/2024  Chief Complaint  Patient presents with   Acute Visit   Sinus Problem    Symptoms began yesterday, sinus pressure, ear ache and sore throat. Mucus is yellow, not green.     HPI Pt is here for a sick visit. -Sinus pressure and congestion started Wednesday and got worse yesterday -Some fever and chills, back pain, ear pain and sore throat -has tried cough syrup, mucinex  and flonase  -no sick contacts that she is aware of, covid test negative -treated for PNA about 3 weeks ago and did get better before recent symptoms started  Current Medication:  Outpatient Encounter Medications as of 04/11/2024  Medication Sig   ALPRAZolam  (XANAX ) 0.25 MG tablet TAKE 1 TABLET BY MOUTH EVERY DAY AT BEDTIME AS NEEDED FOR ANXIETY   amLODipine  (NORVASC ) 5 MG tablet Take 1 tablet (5 mg total) by mouth daily.   aspirin  EC 81 MG tablet Take 81 mg by mouth daily.   estradiol  (ESTRACE  VAGINAL) 0.1 MG/GM vaginal cream Apply 0.5mg  (pea-sized amount)  just inside the vaginal introitus with a finger-tip on Monday, Wednesday and Friday nights.   levothyroxine  (SYNTHROID ) 88 MCG tablet Take 1 tablet (88 mcg total) by mouth daily before breakfast.   predniSONE  (DELTASONE ) 10 MG tablet Take one tab 3 x day for 3 days, then take one tab 2 x a day for 3 days and then take one tab a day for 3 days for pneumonia   rosuvastatin  (CRESTOR ) 5 MG tablet TAKE ONE TABLET BY MOUTH TWICE WEEKLY FOR ATHEROSCLEROSIS   venlafaxine  XR (EFFEXOR  XR) 37.5 MG 24 hr capsule Take 1 capsule (37.5 mg total) by mouth daily.   No facility-administered encounter medications on file as of 04/11/2024.      Medical History: Past Medical History:  Diagnosis Date   Bladder incontinence    Hypertension    Hypothyroidism      Vital Signs: BP  106/75   Pulse 87   Temp 98 F (36.7 C)   Resp 16   Ht 5' 2 (1.575 m)   Wt 137 lb (62.1 kg)   SpO2 98%   BMI 25.06 kg/m    Review of Systems  Constitutional:  Positive for chills, fatigue and fever.  HENT:  Positive for congestion, ear pain, postnasal drip, sinus pressure, sinus pain and sore throat.   Respiratory:  Positive for cough. Negative for shortness of breath and wheezing.   Cardiovascular:  Negative for chest pain.  Genitourinary:  Negative for flank pain.  Musculoskeletal:  Positive for back pain.  Psychiatric/Behavioral: Negative.      Physical Exam Constitutional:      Appearance: Normal appearance.  HENT:     Head: Normocephalic and atraumatic.     Left Ear: Tympanic membrane normal.     Nose: Congestion present.  Eyes:     Extraocular Movements: Extraocular movements intact.  Cardiovascular:     Rate and Rhythm: Normal rate and regular rhythm.     Heart sounds: Normal heart sounds.  Pulmonary:     Effort: Pulmonary effort is normal.     Breath sounds: Normal breath sounds.  Neurological:     General: No focal deficit present.     Mental Status: She is alert.  Psychiatric:        Mood and Affect: Mood normal.  Behavior: Behavior normal.       Assessment/Plan: 1. Acute non-recurrent maxillary sinusitis (Primary) Worsening sinus pressure and congestion, will start on augmentin  and continue mucinex  and flonase  - amoxicillin -clavulanate (AUGMENTIN ) 875-125 MG tablet; Take 1 tablet by mouth 2 (two) times daily. Take with food.  Dispense: 14 tablet; Refill: 0  2. Body aches - POCT Influenza A/B Negative   General Counseling: breahna boylen understanding of the findings of todays visit and agrees with plan of treatment. I have discussed any further diagnostic evaluation that may be needed or ordered today. We also reviewed her medications today. she has been encouraged to call the office with any questions or concerns that should arise  related to todays visit.    Counseling:    No orders of the defined types were placed in this encounter.   No orders of the defined types were placed in this encounter.   Time spent:25 Minutes

## 2024-04-14 ENCOUNTER — Ambulatory Visit: Admitting: Physician Assistant

## 2024-04-25 ENCOUNTER — Other Ambulatory Visit: Payer: Self-pay | Admitting: Physician Assistant

## 2024-04-25 DIAGNOSIS — R61 Generalized hyperhidrosis: Secondary | ICD-10-CM

## 2024-06-05 ENCOUNTER — Other Ambulatory Visit: Payer: Self-pay | Admitting: Physician Assistant

## 2024-06-05 ENCOUNTER — Encounter: Payer: Self-pay | Admitting: Physician Assistant

## 2024-06-05 ENCOUNTER — Ambulatory Visit (INDEPENDENT_AMBULATORY_CARE_PROVIDER_SITE_OTHER): Payer: Medicare HMO | Admitting: Physician Assistant

## 2024-06-05 VITALS — BP 122/73 | HR 88 | Temp 98.0°F | Resp 16 | Ht 62.0 in | Wt 140.0 lb

## 2024-06-05 DIAGNOSIS — Z1211 Encounter for screening for malignant neoplasm of colon: Secondary | ICD-10-CM

## 2024-06-05 DIAGNOSIS — E538 Deficiency of other specified B group vitamins: Secondary | ICD-10-CM

## 2024-06-05 DIAGNOSIS — Z1212 Encounter for screening for malignant neoplasm of rectum: Secondary | ICD-10-CM

## 2024-06-05 DIAGNOSIS — R300 Dysuria: Secondary | ICD-10-CM | POA: Diagnosis not present

## 2024-06-05 DIAGNOSIS — Z0001 Encounter for general adult medical examination with abnormal findings: Secondary | ICD-10-CM

## 2024-06-05 DIAGNOSIS — E039 Hypothyroidism, unspecified: Secondary | ICD-10-CM

## 2024-06-05 DIAGNOSIS — I700 Atherosclerosis of aorta: Secondary | ICD-10-CM | POA: Diagnosis not present

## 2024-06-05 DIAGNOSIS — G4709 Other insomnia: Secondary | ICD-10-CM

## 2024-06-05 DIAGNOSIS — R5383 Other fatigue: Secondary | ICD-10-CM | POA: Diagnosis not present

## 2024-06-05 MED ORDER — ALPRAZOLAM 0.25 MG PO TABS: ORAL_TABLET | ORAL | 1 refills | Status: AC

## 2024-06-05 NOTE — Progress Notes (Signed)
 [facility] [address] [place], [place] [address]  Internal MEDICINE  Office Visit Note  Patient Name: Michelle [name]  [id number]  [id number]  Date of Service: 06/05/2024  Chief Complaint  Patient presents with   Medicare Wellness   Hypertension    HPI Gayatri presents for an annual well visit  Well-appearing 74 y.o.female Routine CRC screening: cologuard due Labs: due New or worsening pain: a little tingling in feet, wearing some light compression. Notices it if standing still long term, not with walking--feels fine. Other concerns: states venlafaxine  is helping all around, not just with night sweats, but overall feels better -walking when weather permits     06/05/2024    1:18 PM 06/04/2023    2:08 PM 05/29/2022    2:00 PM  MMSE - Mini Mental State Exam  Orientation to time 5 5 5   Orientation to Place 5 5 5   Registration 3 3 3   Attention/ Calculation 5 5 5   Recall 3 3 3   Language- name 2 objects 2 2 2   Language- repeat 1 1 1   Language- follow 3 step command 3 3 3   Language- read & follow direction 1 1 1   Write a sentence 1 1 1   Copy design 1 1 1   Total score 30 30 30     Functional Status Survey: Is the patient deaf or have difficulty hearing?: No Does the patient have difficulty seeing, even when wearing glasses/contacts?: No Does the patient have difficulty concentrating, remembering, or making decisions?: No Does the patient have difficulty walking or climbing stairs?: No Does the patient have difficulty dressing or bathing?: No Does the patient have difficulty doing errands alone such as visiting a doctor's office or shopping?: No     09/21/2022    1:21 PM 06/04/2023    2:08 PM 12/03/2023    1:26 PM 01/14/2024    9:51 AM 06/05/2024    1:18 PM  Fall Risk  Falls in the past year? 0 0 0 0 0  Patient at Risk for Falls Due to    No Fall Risks   Fall risk Follow up    Falls evaluation completed        06/05/2024    1:18 PM  Depression screen  PHQ 2/9  Decreased Interest 0  Down, Depressed, Hopeless 0  PHQ - 2 Score 0        No data to display            Current Medication: Outpatient Encounter Medications as of 06/05/2024  Medication Sig   amLODipine  (NORVASC ) 5 MG tablet Take 1 tablet (5 mg total) by mouth daily.   aspirin  EC 81 MG tablet Take 81 mg by mouth daily.   estradiol  (ESTRACE  VAGINAL) 0.1 MG/GM vaginal cream Apply 0.5mg  (pea-sized amount)  just inside the vaginal introitus with a finger-tip on Monday, Wednesday and Friday nights.   levothyroxine  (SYNTHROID ) 88 MCG tablet TAKE 1 TABLET BY MOUTH ONCE DAILY BEFORE BREAKFAST   rosuvastatin  (CRESTOR ) 5 MG tablet TAKE ONE TABLET BY MOUTH TWICE WEEKLY FOR ATHEROSCLEROSIS   venlafaxine  XR (EFFEXOR -XR) 37.5 MG 24 hr capsule Take 1 capsule by mouth once daily   [DISCONTINUED] ALPRAZolam  (XANAX ) 0.25 MG tablet TAKE 1 TABLET BY MOUTH EVERY DAY AT BEDTIME AS NEEDED FOR ANXIETY   [DISCONTINUED] amoxicillin -clavulanate (AUGMENTIN ) 875-125 MG tablet Take 1 tablet by mouth 2 (two) times daily. Take with food.   [DISCONTINUED] predniSONE  (DELTASONE ) 10 MG tablet Take one tab 3 x day for 3  days, then take one tab 2 x a day for 3 days and then take one tab a day for 3 days for pneumonia   ALPRAZolam  (XANAX ) 0.25 MG tablet TAKE 1 TABLET BY MOUTH EVERY DAY AT BEDTIME AS NEEDED FOR ANXIETY   No facility-administered encounter medications on file as of 06/05/2024.    Surgical History: Past Surgical History:  Procedure Laterality Date   child birth natural     diatation      Medical History: Past Medical History:  Diagnosis Date   Bladder incontinence    Hypertension    Hypothyroidism     Family History: Family History  Problem Relation Age of Onset   Cancer Father    Hyperlipidemia Father    Hypertension Father    Breast cancer Neg Hx     Social History   Socioeconomic History   Marital status: Married    Spouse name: Not on file   Number of children: Not on  file   Years of education: Not on file   Highest education level: Not on file  Occupational History   Not on file  Tobacco Use   Smoking status: Former    Types: Cigarettes   Smokeless tobacco: Never  Substance and Sexual Activity   Alcohol use: No   Drug use: No   Sexual activity: Not Currently    Birth control/protection: Surgical, Post-menopausal    Comment: Hysterectomy  Other Topics Concern   Not on file  Social History Narrative   Not on file   Social Drivers of Health   Tobacco Use: Medium Risk (06/05/2024)   Patient History    Smoking Tobacco Use: Former    Smokeless Tobacco Use: Never    Passive Exposure: Not on Actuary Strain: Low Risk  (04/07/2024)   Received from [facility]   Overall Financial Resource Strain (CARDIA)    Difficulty of Paying Living Expenses: Not very hard  Food Insecurity: Food Insecurity Present (04/07/2024)   Received from [facility]   Epic    Within the past 12 months, you worried that your food would run out before you got the money to buy more.: Sometimes true    Within the past 12 months, the food you bought just didn't last and you didn't have money to get more.: Often true  Transportation Needs: No Transportation Needs (04/07/2024)   Received from [facility] - Transportation    In the past 12 months, has lack of transportation kept you from medical appointments or from getting medications?: No    Lack of Transportation (Non-Medical): No  Physical Activity: Not on file  Stress: Not on file  Social Connections: Not on file  Intimate Partner Violence: Not on file  Depression (PHQ2-9): Low Risk (06/05/2024)   Depression (PHQ2-9)    PHQ-2 Score: 0  Alcohol Screen: Low Risk (02/27/2022)   Alcohol Screen    Last Alcohol Screening Score (AUDIT): 0  Housing: Low Risk  (04/07/2024)   Received from [facility]   Epic    In the last 12  months, was there a time when you were not able to pay the mortgage or rent on time?: No    In the past 12 months, how many times have you moved where you were living?: 0    At any time in the past 12 months, were you homeless or living in a shelter (including now)?: No  Utilities:  Not At Risk (04/07/2024)   Received from [facility]   Epic    In the past 12 months has the electric, gas, oil, or water company threatened to shut off services in your home?: No  Health Literacy: Not on file      Review of Systems  Constitutional:  Negative for chills, fatigue and unexpected weight change.  HENT:  Negative for congestion, postnasal drip, rhinorrhea, sneezing and sore throat.   Eyes:  Negative for redness.  Respiratory:  Negative for cough, chest tightness and shortness of breath.   Cardiovascular:  Negative for chest pain and palpitations.  Gastrointestinal:  Negative for abdominal pain, constipation, diarrhea, nausea and vomiting.  Genitourinary:  Negative for dysuria.  Musculoskeletal:  Negative for back pain, joint swelling and neck pain.  Skin:  Negative for rash.  Neurological: Negative.  Negative for tremors and numbness.  Hematological:  Negative for adenopathy. Does not bruise/bleed easily.  Psychiatric/Behavioral:  Positive for sleep disturbance. Negative for behavioral problems (Depression) and suicidal ideas. The patient is nervous/anxious.     Vital Signs: BP 122/73   Pulse 88   Temp 98 F (36.7 C)   Resp 16   Ht 5' 2 (1.575 m)   Wt 140 lb (63.5 kg)   SpO2 93%   BMI 25.61 kg/m    Physical Exam Constitutional:      Appearance: Normal appearance.  HENT:     Head: Normocephalic and atraumatic.     Left Ear: Tympanic membrane normal.  Eyes:     Extraocular Movements: Extraocular movements intact.  Cardiovascular:     Rate and Rhythm: Normal rate and regular rhythm.     Heart sounds: Normal heart sounds.  Pulmonary:     Effort: Pulmonary  effort is normal.     Breath sounds: Normal breath sounds.  Neurological:     General: No focal deficit present.     Mental Status: She is alert.  Psychiatric:        Mood and Affect: Mood normal.        Behavior: Behavior normal.        Assessment/Plan: 1. Encounter for Medicare annual examination with abnormal findings (Primary) AWV performed, labs ordered, cologuard due, declines further mammogram  2. Acquired hypothyroidism Will check labs for monitoring - TSH + free T4  3. Aortic atherosclerosis Continue crestor  and will update labs - Lipid Panel With LDL/HDL Ratio  4. Other insomnia Doing better with effexor , may use xanax  prn - ALPRAZolam  (XANAX ) 0.25 MG tablet; TAKE 1 TABLET BY MOUTH EVERY DAY AT BEDTIME AS NEEDED FOR ANXIETY  Dispense: 45 tablet; Refill: 1  5. Other fatigue - CBC w/Diff/Platelet - Comprehensive metabolic panel with GFR - TSH + free T4 - Lipid Panel With LDL/HDL Ratio  6. Screening for colorectal cancer - Cologuard  7. B12 deficiency - B12 and Folate Panel  8. Dysuria - UA/M w/rflx Culture, Routine - Microscopic Examination - Urine Culture, Reflex     General Counseling: demesha boorman understanding of the findings of todays visit and agrees with plan of treatment. I have discussed any further diagnostic evaluation that may be needed or ordered today. We also reviewed her medications today. she has been encouraged to call the office with any questions or concerns that should arise related to todays visit.    Orders Placed This Encounter  Procedures   Microscopic Examination   Urine Culture, Reflex   UA/M w/rflx Culture, Routine   CBC w/Diff/Platelet  Comprehensive metabolic panel with GFR   TSH + free T4   Lipid Panel With LDL/HDL Ratio   Cologuard   B12 and Folate Panel    Meds ordered this encounter  Medications   ALPRAZolam  (XANAX ) 0.25 MG tablet    Sig: TAKE 1 TABLET BY MOUTH EVERY DAY AT BEDTIME AS NEEDED FOR  ANXIETY    Dispense:  45 tablet    Refill:  1    Return in about 6 months (around 12/03/2024) for general follow up.   Total time spent:35 Minutes Time spent includes review of chart, medications, test results, and follow up plan with the patient.   [place] Controlled Substance Database was reviewed by me.  This patient was seen by Tinnie Pro, PA-C in collaboration with Dr. Sigrid Bathe as a part of collaborative care agreement.  Tinnie Pro, PA-C Internal medicine

## 2024-06-08 LAB — UA/M W/RFLX CULTURE, ROUTINE
Bilirubin, UA: NEGATIVE
Glucose, UA: NEGATIVE
Ketones, UA: NEGATIVE
Nitrite, UA: NEGATIVE
Protein,UA: NEGATIVE
RBC, UA: NEGATIVE
Specific Gravity, UA: 1.009 (ref 1.005–1.030)
Urobilinogen, Ur: 0.2 mg/dL (ref 0.2–1.0)
pH, UA: 6 (ref 5.0–7.5)

## 2024-06-08 LAB — MICROSCOPIC EXAMINATION
Bacteria, UA: NONE SEEN
Casts: NONE SEEN /LPF

## 2024-06-08 LAB — URINE CULTURE, REFLEX

## 2024-06-13 LAB — CBC WITH DIFFERENTIAL/PLATELET
Basophils Absolute: 0 x10E3/uL (ref 0.0–0.2)
Basos: 1 %
EOS (ABSOLUTE): 0.2 x10E3/uL (ref 0.0–0.4)
Eos: 2 %
Hematocrit: 45 % (ref 34.0–46.6)
Hemoglobin: 14.7 g/dL (ref 11.1–15.9)
Immature Grans (Abs): 0 x10E3/uL (ref 0.0–0.1)
Immature Granulocytes: 0 %
Lymphocytes Absolute: 1.9 x10E3/uL (ref 0.7–3.1)
Lymphs: 27 %
MCH: 28.8 pg (ref 26.6–33.0)
MCHC: 32.7 g/dL (ref 31.5–35.7)
MCV: 88 fL (ref 79–97)
Monocytes Absolute: 0.6 x10E3/uL (ref 0.1–0.9)
Monocytes: 8 %
Neutrophils Absolute: 4.4 x10E3/uL (ref 1.4–7.0)
Neutrophils: 62 %
Platelets: 243 x10E3/uL (ref 150–450)
RBC: 5.1 x10E6/uL (ref 3.77–5.28)
RDW: 14 % (ref 11.7–15.4)
WBC: 7.1 x10E3/uL (ref 3.4–10.8)

## 2024-06-13 LAB — LIPID PANEL WITH LDL/HDL RATIO
Cholesterol, Total: 143 mg/dL (ref 100–199)
HDL: 50 mg/dL
LDL Chol Calc (NIH): 74 mg/dL (ref 0–99)
LDL/HDL Ratio: 1.5 ratio (ref 0.0–3.2)
Triglycerides: 101 mg/dL (ref 0–149)
VLDL Cholesterol Cal: 19 mg/dL (ref 5–40)

## 2024-06-13 LAB — COMPREHENSIVE METABOLIC PANEL WITH GFR
ALT: 16 IU/L (ref 0–32)
AST: 20 IU/L (ref 0–40)
Albumin: 4.3 g/dL (ref 3.8–4.8)
Alkaline Phosphatase: 113 IU/L (ref 49–135)
BUN/Creatinine Ratio: 20 (ref 12–28)
BUN: 17 mg/dL (ref 8–27)
Bilirubin Total: 0.4 mg/dL (ref 0.0–1.2)
CO2: 26 mmol/L (ref 20–29)
Calcium: 10.2 mg/dL (ref 8.7–10.3)
Chloride: 97 mmol/L (ref 96–106)
Creatinine, Ser: 0.84 mg/dL (ref 0.57–1.00)
Globulin, Total: 2.9 g/dL (ref 1.5–4.5)
Glucose: 98 mg/dL (ref 70–99)
Potassium: 3.9 mmol/L (ref 3.5–5.2)
Sodium: 136 mmol/L (ref 134–144)
Total Protein: 7.2 g/dL (ref 6.0–8.5)
eGFR: 72 mL/min/1.73

## 2024-06-13 LAB — B12 AND FOLATE PANEL
Folate: 10.2 ng/mL
Vitamin B-12: 445 pg/mL (ref 232–1245)

## 2024-06-13 LAB — TSH+FREE T4
Free T4: 1.28 ng/dL (ref 0.82–1.77)
TSH: 2.87 u[IU]/mL (ref 0.450–4.500)

## 2024-06-19 LAB — COLOGUARD

## 2024-06-24 ENCOUNTER — Ambulatory Visit: Payer: Self-pay | Admitting: Physician Assistant

## 2024-06-24 NOTE — Progress Notes (Signed)
Pt notified for labs result  

## 2024-12-01 ENCOUNTER — Ambulatory Visit: Admitting: Physician Assistant

## 2025-06-08 ENCOUNTER — Ambulatory Visit: Admitting: Physician Assistant
